# Patient Record
Sex: Female | Born: 1940 | Race: White | Hispanic: No | Smoking: Never smoker
Health system: Southern US, Community
[De-identification: ages and names within clinical notes are randomized; demographics above are authoritative.]

## PROBLEM LIST (undated history)

## (undated) DIAGNOSIS — R0600 Dyspnea, unspecified: Secondary | ICD-10-CM

## (undated) DIAGNOSIS — K219 Gastro-esophageal reflux disease without esophagitis: Secondary | ICD-10-CM

## (undated) DIAGNOSIS — M1990 Unspecified osteoarthritis, unspecified site: Secondary | ICD-10-CM

## (undated) DIAGNOSIS — Z87442 Personal history of urinary calculi: Secondary | ICD-10-CM

## (undated) DIAGNOSIS — E785 Hyperlipidemia, unspecified: Secondary | ICD-10-CM

## (undated) DIAGNOSIS — N3289 Other specified disorders of bladder: Secondary | ICD-10-CM

## (undated) DIAGNOSIS — Z889 Allergy status to unspecified drugs, medicaments and biological substances status: Secondary | ICD-10-CM

## (undated) DIAGNOSIS — R918 Other nonspecific abnormal finding of lung field: Secondary | ICD-10-CM

## (undated) DIAGNOSIS — J189 Pneumonia, unspecified organism: Secondary | ICD-10-CM

## (undated) DIAGNOSIS — J449 Chronic obstructive pulmonary disease, unspecified: Secondary | ICD-10-CM

## (undated) HISTORY — PX: ESOPHAGOGASTRODUODENOSCOPY: SHX1529

## (undated) HISTORY — PX: COLONOSCOPY: SHX174

## (undated) HISTORY — PX: TONSILLECTOMY: SUR1361

## (undated) HISTORY — PX: HERNIA REPAIR: SHX51

---

## 2005-01-18 ENCOUNTER — Ambulatory Visit: Payer: Self-pay | Admitting: Family Medicine

## 2005-02-03 ENCOUNTER — Ambulatory Visit: Payer: Self-pay | Admitting: Family Medicine

## 2005-03-15 ENCOUNTER — Ambulatory Visit: Payer: Self-pay | Admitting: Family Medicine

## 2007-01-25 ENCOUNTER — Ambulatory Visit: Payer: Self-pay | Admitting: Family Medicine

## 2007-05-11 ENCOUNTER — Ambulatory Visit: Payer: Self-pay | Admitting: Gastroenterology

## 2007-07-24 ENCOUNTER — Ambulatory Visit: Payer: Self-pay | Admitting: Gastroenterology

## 2008-01-28 ENCOUNTER — Ambulatory Visit: Payer: Self-pay | Admitting: Family Medicine

## 2009-01-29 ENCOUNTER — Ambulatory Visit: Payer: Self-pay | Admitting: Family Medicine

## 2009-06-11 ENCOUNTER — Ambulatory Visit: Payer: Self-pay | Admitting: Family Medicine

## 2009-09-07 ENCOUNTER — Ambulatory Visit: Payer: Self-pay | Admitting: Gastroenterology

## 2009-09-09 ENCOUNTER — Ambulatory Visit: Payer: Self-pay | Admitting: Family Medicine

## 2009-10-16 ENCOUNTER — Ambulatory Visit: Payer: Self-pay | Admitting: Gastroenterology

## 2009-12-10 ENCOUNTER — Ambulatory Visit: Payer: Self-pay | Admitting: Family Medicine

## 2010-02-01 ENCOUNTER — Ambulatory Visit: Payer: Self-pay | Admitting: Family Medicine

## 2010-06-15 ENCOUNTER — Ambulatory Visit: Payer: Self-pay | Admitting: Family Medicine

## 2011-03-08 ENCOUNTER — Ambulatory Visit: Payer: Self-pay | Admitting: Family Medicine

## 2011-10-26 ENCOUNTER — Observation Stay: Payer: Self-pay | Admitting: Internal Medicine

## 2011-10-26 LAB — COMPREHENSIVE METABOLIC PANEL
Albumin: 4.1 g/dL (ref 3.4–5.0)
Alkaline Phosphatase: 83 U/L (ref 50–136)
Anion Gap: 8 (ref 7–16)
BUN: 22 mg/dL — ABNORMAL HIGH (ref 7–18)
Chloride: 104 mmol/L (ref 98–107)
Creatinine: 0.79 mg/dL (ref 0.60–1.30)
EGFR (African American): >60
Glucose: 108 mg/dL — ABNORMAL HIGH (ref 65–99)
Potassium: 4 mmol/L (ref 3.5–5.1)
SGOT(AST): 20 U/L (ref 15–37)
Sodium: 140 mmol/L (ref 136–145)
Total Protein: 8.2 g/dL (ref 6.4–8.2)

## 2011-10-26 LAB — CBC
MCH: 31 pg (ref 26.0–34.0)
MCHC: 34.3 g/dL (ref 32.0–36.0)
MCV: 90 fL (ref 80–100)
Platelet: 210 10*3/uL (ref 150–440)
RBC: 4.5 10*6/uL (ref 3.80–5.20)
RDW: 12.6 % (ref 11.5–14.5)
WBC: 8 10*3/uL (ref 3.6–11.0)

## 2011-10-26 LAB — URINALYSIS, COMPLETE
Bilirubin,UR: NEGATIVE
Ketone: NEGATIVE
Leukocyte Esterase: NEGATIVE
Ph: 7 (ref 4.5–8.0)
Protein: NEGATIVE
RBC,UR: 2 /HPF (ref 0–5)
Squamous Epithelial: 3

## 2011-10-26 LAB — CK TOTAL AND CKMB (NOT AT ARMC)
CK, Total: 72 U/L (ref 21–215)
CK-MB: 1.4 ng/mL (ref 0.5–3.6)

## 2011-10-26 LAB — TROPONIN I
Troponin-I: <0.02 ng/mL
Troponin-I: <0.02 ng/mL

## 2011-10-27 LAB — BASIC METABOLIC PANEL
Anion Gap: 7 (ref 7–16)
BUN: 17 mg/dL (ref 7–18)
Calcium, Total: 8.2 mg/dL — ABNORMAL LOW (ref 8.5–10.1)
Co2: 28 mmol/L (ref 21–32)
Creatinine: 0.97 mg/dL (ref 0.60–1.30)
EGFR (African American): >60
EGFR (Non-African Amer.): 60 — ABNORMAL LOW
Glucose: 104 mg/dL — ABNORMAL HIGH (ref 65–99)
Osmolality: 287 (ref 275–301)
Potassium: 3.8 mmol/L (ref 3.5–5.1)
Sodium: 143 mmol/L (ref 136–145)

## 2011-10-27 LAB — LIPID PANEL
Triglycerides: 250 mg/dL — ABNORMAL HIGH (ref 0–200)
VLDL Cholesterol, Calc: 50 mg/dL — ABNORMAL HIGH (ref 5–40)

## 2011-10-27 LAB — CBC WITH DIFFERENTIAL/PLATELET
Basophil #: 0 10*3/uL (ref 0.0–0.1)
Basophil %: 0.5 %
Eosinophil #: 0.2 10*3/uL (ref 0.0–0.7)
HCT: 36.8 % (ref 35.0–47.0)
HGB: 12.4 g/dL (ref 12.0–16.0)
Lymphocyte #: 2.3 10*3/uL (ref 1.0–3.6)
MCHC: 33.7 g/dL (ref 32.0–36.0)
MCV: 91 fL (ref 80–100)
Monocyte #: 0.7 x10 3/mm (ref 0.2–0.9)
Monocyte %: 9 %
Neutrophil #: 4.7 10*3/uL (ref 1.4–6.5)
Platelet: 182 10*3/uL (ref 150–440)
RBC: 4.06 10*6/uL (ref 3.80–5.20)
RDW: 12.9 % (ref 11.5–14.5)

## 2011-10-27 LAB — TROPONIN I: Troponin-I: 0.02 ng/mL

## 2012-03-20 ENCOUNTER — Ambulatory Visit: Payer: Self-pay | Admitting: Family Medicine

## 2012-12-03 ENCOUNTER — Ambulatory Visit: Payer: Self-pay | Admitting: Gastroenterology

## 2013-03-28 ENCOUNTER — Ambulatory Visit: Payer: Self-pay | Admitting: Family Medicine

## 2014-04-29 ENCOUNTER — Ambulatory Visit: Payer: Self-pay | Admitting: Family Medicine

## 2014-08-17 NOTE — H&P (Signed)
PATIENT NAME:  Kim Bentley, Kim Bentley MR#:  [id number] DATE OF BIRTH:  1940/05/27  DATE OF ADMISSION:  10/26/2011  PRIMARY CARE PHYSICIAN: Dr. Lovie Macadamia   CHIEF COMPLAINT: Woke up at 3:00 a.m. feeling sick.   HISTORY OF PRESENT ILLNESS: This is a 74 year old female otherwise very healthy, woke up at 3:00 a.m. feeling clammy, sick, nauseated. She was dizzy when she got up. She vomited. She has been having a nagging headache going on. She went back to bed and got up at 7:30, still felt dizzy and sick and lightheaded. She went to the urgent care. She told them about some tightness in both sides of her chest that lasted a few minutes during this episode. She was found to have flipped T waves laterally on EKG and sent to the ER from urgent care. She has also been complaining of chest congestion, coughing up whitish phlegm. She took Tussionex last night. In the ER she had a normal white count, troponin was negative, chest x-ray negative.   PAST MEDICAL HISTORY: 1. Allergies. 2. Gastroesophageal reflux disease. 3. Colon polyps. 4. Previous CT scan of the chest back in 2012 showed stable appearing bilateral pulmonary nodular densities.   PAST SURGICAL HISTORY:  1. Hernia. 2. Tonsils.   ALLERGIES: Acyclovir.   MEDICATIONS:  1. Calcium and vitamin D daily.  2. Allegra-D as needed for allergies.  3. Protonix 40 mg daily.  4. Vitamin D 50,000 units weekly. 5. Vitamin E 1 tablet daily.   SOCIAL HISTORY: No smoking. Occasional wine. No drug use. She works in Psychologist, counselling.   FAMILY HISTORY: Mother died at 55 of diabetic complications. Father died at 65 of an accident. Siblings with diabetes.   REVIEW OF SYSTEMS: CONSTITUTIONAL: Positive for sweats. No fever. No chills. Positive for fatigue. No weight gain. No weight loss. EYES: Some blurry vision. Needs to go back to the eye doctor for glasses change. EARS, NOSE, MOUTH, AND THROAT: Decreased hearing. Positive for postnasal drip, dry mouth.  CARDIOVASCULAR: Positive for chest pressure. RESPIRATORY: Occasional shortness of breath. Positive for cough, whitish phlegm. No sputum. No hemoptysis. GASTROINTESTINAL: Positive for nausea. Positive for vomiting. No hematemesis. No abdominal pain. No bright red blood per rectum. No melena. No diarrhea. No constipation. GENITOURINARY: No burning on urination MUSCULOSKELETAL: Positive for muscle pains and cramps. INTEGUMENTARY: Positive for rash on the back. NEUROLOGIC: No fainting or blackouts. PSYCHIATRIC: No anxiety or depression. ENDOCRINE: No thyroid problems. HEMATOLOGIC/LYMPHATIC: No anemia, no easy bruising or bleeding.   PHYSICAL EXAMINATION:  VITAL SIGNS: Temperature 97.4, pulse 69, respirations 18, blood pressure 136/69, pulse oximetry 97%.   GENERAL: No respiratory distress.   EYES: Conjunctivae and lids normal. Pupils equal, round, and reactive to light. Extraocular muscles intact. No nystagmus.   EARS, NOSE, MOUTH, AND THROAT: Tympanic membrane bulging and erythematous. Nasal mucosa no erythema. Throat no erythema. No exudate seen. Lips and gums no lesions.   NECK: No JVD. No bruits. No lymphadenopathy. No thyromegaly. No thyroid nodules palpated.   RESPIRATORY: Decreased breath sounds bilaterally. Coarse breath sounds. No rhonchi, rales, or wheeze heard.   CARDIOVASCULAR: S1, S2 normal. No gallops, rubs, or murmurs heard. Carotid upstroke 2+ bilaterally. No bruits.   EXTREMITIES: Dorsalis pedis pulses 2+ bilaterally. No edema of the lower extremity.   ABDOMEN: Soft, nontender. No organomegaly/splenomegaly. Normoactive bowel sounds. No masses felt.   LYMPHATIC: No lymph nodes in the neck.   MUSCULOSKELETAL: No clubbing, edema, or cyanosis.   SKIN: On the back secondary excoriations, some erythematous areas.  NEUROLOGIC: Cranial nerves II through XII grossly intact. Deep tendon reflexes 2+ bilateral lower extremity. Power 5/5 bilateral upper and lower extremities.    PSYCHIATRIC: Patient is oriented to person, place, and time.   LABORATORY, DIAGNOSTIC, AND RADIOLOGICAL DATA: CT scan of the head negative. Chest x-ray: Mild enlargement of cardiac silhouette, otherwise negative. Troponin negative. White blood cell count 8.0, hemoglobin and hematocrit 13.9 and 40.6, platelet count 210, glucose 108, BUN 22, creatinine 0.79, sodium 140, potassium 4.0, chloride 104, CO2 28, calcium 9.1. Liver function tests normal. EKG showed normal sinus rhythm, 67 beats per minute, flipped T waves laterally.   ASSESSMENT AND PLAN:  1. Chest pain and abnormal EKG. Will admit as an observation. Get serial cardiac enzymes to rule out myocardial infarction. Will continue aspirin, one dose already given in the Emergency Room. Unfortunately tomorrow is 07/04 and stress test is not available. If cardiac enzymes are negative patient may be able to be discharged home and follow up as outpatient stress test. What I believe is the cause of her chest pain is an upper respiratory tract infection and bronchitis.  2. Otitis media and bronchitis. Will give Augmentin 500 mg twice a day.  3. Nausea, vomiting and dizziness, could be secondary to the otitis media. Will treat that, p.r.n. Zofran. Will give IV fluid hydration.  4. Gastroesophageal reflux disease. Continue Protonix.  5. Allergies. Continue Allegra.  6. Rash on the back. Will give triamcinolone cream.    TIME SPENT ON OBSERVATION ADMISSION: 55 minutes.   ____________________________ Tana Conch. Leslye Peer, MD rjw:cms D: 10/26/2011 13:44:41 ET T: 10/26/2011 13:52:53 ET JOB#: [id number]  cc: Tana Conch. Leslye Peer, MD, <Dictator> Youlanda Roys. Lovie Macadamia, MD Marisue Brooklyn MD ELECTRONICALLY SIGNED 10/27/2011 12:24

## 2014-08-17 NOTE — Discharge Summary (Signed)
PATIENT NAME:  Kim Bentley, Kim Bentley MR#:  [id number] DATE OF BIRTH:  [birth year]  DATE OF ADMISSION:  10/26/2011 DATE OF DISCHARGE:  10/27/2011  DISCHARGE DIAGNOSES:  1. Possible acute bronchitis and/or otitis media, on antibiotics, improving.  2. Chest pain, likely due to constant coughing, noncardiac.  3. Hypercholesterolemia/hypertriglyceridemia, started on fish oil and will require outpatient adjustment.  4. Rash in the back, improving on steroid cream.   SECONDARY DIAGNOSES:  1. Gastroesophageal reflux disease.  2. History of colonic polyp.   CONSULTATIONS: None.   LABORATORY DATA/RADIOLOGY:  1. CT scan of the head without contrast in the ED was negative for any acute intracranial process.  2. Chest x-ray showed no acute cardiopulmonary disease on admission. There was mild enlargement of cardiac silhouette without pulmonary vascular congestion or interstitial edema.  3. Major laboratory panel: Urinalysis on admission was negative.   HISTORY AND SHORT HOSPITAL COURSE: The patient is a 74 year old female with above-mentioned medical problems who was admitted for chest pain, was ruled out with 3 negative sets of cardiac enzymes. Please see Dr. Marshia Ly dictated History and Physical for further details. The patient was feeling much better on antibiotics. She was still having some coughing but had much improved and was agreeable to the discharge plan. On the date of discharge her vital signs were as follows: Temperature 98.5, heart rate 94 per minute, respirations 18 per minute, blood pressure 133/74 mmHg. She was saturating 96% on room air.   PERTINENT PHYSICAL EXAMINATION ON THE DAY OF DISCHARGE:   CARDIOVASCULAR: S1 and S2 normal. No murmurs, rubs, gallop.   LUNGS: Clear to auscultation bilaterally. No wheezing, rales, rhonchi, or crepitation.   ABDOMEN: Soft, benign.   NEUROLOGIC: Nonfocal examination.   All other physical examination remained at baseline. She did have a mild rash in  the back, which was improving with the topical steroid application.   DISCHARGE MEDICATIONS:  1. Protonix 40 mg p.o. daily.  2. Vitamin D3 at 5,000 international units once daily.  3. Calcium with vitamin D 1 tablet p.o. daily.  4. Vitamin E 1 tablet p.o. daily.  5. Fexofenadine 1 tablet p.o. daily as needed.  6. Triamcinolone 0.05% topical ointment apply topically twice a day.  7. Augmentin 875 mg/125 mg 1 tablet p.o. b.i.d. for 5 days.  8. Fish oil 500 mg 2 capsules p.o. b.i.d.  9. Robitussin 5 mL p.o. 4 times a day.   DISCHARGE DIET: Low cholesterol.   DISCHARGE ACTIVITY: As tolerated.   DISCHARGE INSTRUCTIONS AND FOLLOWUP: Patient was instructed to follow up with her primary care physician, Dr. Juluis Pitch, in 1 to 2 weeks.   TOTAL TIME DISCHARGING THIS PATIENT: 45 minutes.     ____________________________ Lucina Mellow. Manuella Ghazi, MD vss:vtd D: 10/27/2011 11:57:20 ET T: 10/28/2011 11:59:06 ET JOB#: [id number] cc: Cherry Wittwer S. Manuella Ghazi, MD, <Dictator> Youlanda Roys. Lovie Macadamia, MD Remer Macho MD ELECTRONICALLY SIGNED 10/28/2011 19:45

## 2016-06-03 ENCOUNTER — Other Ambulatory Visit: Payer: Self-pay | Admitting: Family Medicine

## 2016-06-03 DIAGNOSIS — Z1231 Encounter for screening mammogram for malignant neoplasm of breast: Secondary | ICD-10-CM

## 2016-08-02 ENCOUNTER — Ambulatory Visit
Admission: RE | Admit: 2016-08-02 | Discharge: 2016-08-02 | Disposition: A | Discharge status: Home / Self Care | Payer: Medicare HMO | Source: Ambulatory Visit | Attending: Family Medicine | Admitting: Family Medicine

## 2016-08-02 DIAGNOSIS — Z1231 Encounter for screening mammogram for malignant neoplasm of breast: Secondary | ICD-10-CM | POA: Diagnosis present

## 2017-08-28 ENCOUNTER — Other Ambulatory Visit: Payer: Self-pay | Admitting: Family Medicine

## 2017-08-28 DIAGNOSIS — Z1231 Encounter for screening mammogram for malignant neoplasm of breast: Secondary | ICD-10-CM

## 2017-09-14 ENCOUNTER — Encounter: Payer: Self-pay | Admitting: Radiology

## 2017-09-14 ENCOUNTER — Ambulatory Visit
Admission: RE | Admit: 2017-09-14 | Discharge: 2017-09-14 | Disposition: A | Discharge status: Home / Self Care | Payer: Medicare HMO | Source: Ambulatory Visit | Attending: Family Medicine | Admitting: Family Medicine

## 2017-09-14 DIAGNOSIS — Z1231 Encounter for screening mammogram for malignant neoplasm of breast: Secondary | ICD-10-CM | POA: Insufficient documentation

## 2018-03-21 ENCOUNTER — Encounter: Payer: Self-pay | Admitting: *Deleted

## 2018-03-26 ENCOUNTER — Ambulatory Visit
Admission: RE | Admit: 2018-03-26 | Discharge: 2018-03-26 | Disposition: A | Discharge status: Home / Self Care | Payer: Medicare HMO | Source: Ambulatory Visit | Attending: Gastroenterology | Admitting: Gastroenterology

## 2018-03-26 ENCOUNTER — Encounter
Admission: RE | Disposition: A | Discharge status: Home / Self Care | Payer: Self-pay | Source: Ambulatory Visit | Attending: Gastroenterology

## 2018-03-26 ENCOUNTER — Encounter: Payer: Self-pay | Admitting: *Deleted

## 2018-03-26 ENCOUNTER — Other Ambulatory Visit: Payer: Self-pay

## 2018-03-26 ENCOUNTER — Ambulatory Visit: Payer: Medicare HMO | Admitting: Anesthesiology

## 2018-03-26 DIAGNOSIS — Z1211 Encounter for screening for malignant neoplasm of colon: Secondary | ICD-10-CM | POA: Diagnosis present

## 2018-03-26 DIAGNOSIS — K5730 Diverticulosis of large intestine without perforation or abscess without bleeding: Secondary | ICD-10-CM | POA: Insufficient documentation

## 2018-03-26 DIAGNOSIS — N3289 Other specified disorders of bladder: Secondary | ICD-10-CM | POA: Diagnosis not present

## 2018-03-26 DIAGNOSIS — Z79899 Other long term (current) drug therapy: Secondary | ICD-10-CM | POA: Diagnosis not present

## 2018-03-26 DIAGNOSIS — D123 Benign neoplasm of transverse colon: Secondary | ICD-10-CM | POA: Insufficient documentation

## 2018-03-26 DIAGNOSIS — M1990 Unspecified osteoarthritis, unspecified site: Secondary | ICD-10-CM | POA: Diagnosis not present

## 2018-03-26 DIAGNOSIS — Z86010 Personal history of colonic polyps: Secondary | ICD-10-CM | POA: Insufficient documentation

## 2018-03-26 DIAGNOSIS — K219 Gastro-esophageal reflux disease without esophagitis: Secondary | ICD-10-CM | POA: Diagnosis not present

## 2018-03-26 DIAGNOSIS — D122 Benign neoplasm of ascending colon: Secondary | ICD-10-CM | POA: Insufficient documentation

## 2018-03-26 HISTORY — DX: Unspecified osteoarthritis, unspecified site: M19.90

## 2018-03-26 HISTORY — DX: Gastro-esophageal reflux disease without esophagitis: K21.9

## 2018-03-26 HISTORY — PX: COLONOSCOPY WITH PROPOFOL: SHX5780

## 2018-03-26 HISTORY — DX: Other specified disorders of bladder: N32.89

## 2018-03-26 HISTORY — DX: Allergy status to unspecified drugs, medicaments and biological substances: Z88.9

## 2018-03-26 SURGERY — COLONOSCOPY WITH PROPOFOL
Anesthesia: General

## 2018-03-26 MED ORDER — FENTANYL CITRATE (PF) 100 MCG/2ML IJ SOLN
INTRAMUSCULAR | Status: AC
  Filled 2018-03-26: qty 2

## 2018-03-26 MED ORDER — PROPOFOL 500 MG/50ML IV EMUL
INTRAVENOUS | Status: AC
Start: 2018-03-26 — End: ?
  Filled 2018-03-26: qty 50

## 2018-03-26 MED ORDER — SODIUM CHLORIDE 0.9 % IV SOLN
INTRAVENOUS | Status: DC
  Administered 2018-03-26: 10:00:00 via INTRAVENOUS

## 2018-03-26 MED ORDER — MIDAZOLAM HCL 2 MG/2ML IJ SOLN
INTRAMUSCULAR | Status: DC | PRN
  Administered 2018-03-26: 1 mg via INTRAVENOUS

## 2018-03-26 MED ORDER — EPHEDRINE SULFATE 50 MG/ML IJ SOLN
INTRAMUSCULAR | Status: DC | PRN
  Administered 2018-03-26 (×2): 5 mg via INTRAVENOUS
  Administered 2018-03-26: 10 mg via INTRAVENOUS

## 2018-03-26 MED ORDER — FENTANYL CITRATE (PF) 100 MCG/2ML IJ SOLN
INTRAMUSCULAR | Status: DC | PRN
  Administered 2018-03-26 (×2): 25 ug via INTRAVENOUS
  Administered 2018-03-26: 50 ug via INTRAVENOUS

## 2018-03-26 MED ORDER — PROPOFOL 500 MG/50ML IV EMUL
INTRAVENOUS | Status: DC | PRN
  Administered 2018-03-26: 120 ug/kg/min via INTRAVENOUS

## 2018-03-26 MED ORDER — EPHEDRINE SULFATE 50 MG/ML IJ SOLN
INTRAMUSCULAR | Status: AC
  Filled 2018-03-26: qty 1

## 2018-03-26 MED ORDER — MIDAZOLAM HCL 2 MG/2ML IJ SOLN
INTRAMUSCULAR | Status: AC
  Filled 2018-03-26: qty 2

## 2018-03-26 NOTE — Anesthesia Procedure Notes (Addendum)
Performed by: Cook-Martin, Viana Sleep Pre-anesthesia Checklist: Patient identified, Emergency Drugs available, Suction available, Patient being monitored and Timeout performed Patient Re-evaluated:Patient Re-evaluated prior to induction Oxygen Delivery Method: Nasal cannula Preoxygenation: Pre-oxygenation with 100% oxygen Induction Type: IV induction Ventilation: Oral airway inserted - appropriate to patient size Placement Confirmation: positive ETCO2 and CO2 detector       

## 2018-03-26 NOTE — Anesthesia Post-op Follow-up Note (Signed)
Anesthesia QCDR form completed.        

## 2018-03-26 NOTE — Transfer of Care (Signed)
Immediate Anesthesia Transfer of Care Note  Patient: Kim Bentley  Procedure(s) Performed: COLONOSCOPY WITH PROPOFOL (N/A )  Patient Location: PACU  Anesthesia Type:MAC  Level of Consciousness: awake and sedated  Airway & Oxygen Therapy: Patient Spontanous Breathing and Patient connected to face mask oxygen  Post-op Assessment: Report given to RN and Post -op Vital signs reviewed and stable  Post vital signs: Reviewed and stable  Last Vitals:  Vitals Value Taken Time  BP    Temp    Pulse    Resp    SpO2      Last Pain:  Vitals:   03/26/18 0935  TempSrc: Tympanic  PainSc: 0-No pain         Complications: No apparent anesthesia complications

## 2018-03-26 NOTE — Op Note (Signed)
[facility] Gastroenterology Patient Name: Kim Bentley Procedure Date: 03/26/2018 9:59 AM MRN: [id number] Account #: [id number] Date of Birth: 1940/08/05 Admit Type: Outpatient Age: 77 Room: [facility] ENDO ROOM 3 Gender: Female Note Status: Finalized Procedure:            Colonoscopy Indications:          Personal history of colonic polyps Providers:            Lollie Sails, MD Referring MD:         Youlanda Roys. Lovie Macadamia, MD (Referring MD) Medicines:            Monitored Anesthesia Care Complications:        No immediate complications. Procedure:            Pre-Anesthesia Assessment:                       - ASA Grade Assessment: II - A patient with mild                        systemic disease.                       After obtaining informed consent, the colonoscope was                        passed under direct vision. Throughout the procedure,                        the patient's blood pressure, pulse, and oxygen                        saturations were monitored continuously. The                        Colonoscope was introduced through the anus and                        advanced to the the cecum, identified by appendiceal                        orifice and ileocecal valve. The colonoscopy was                        performed without difficulty. The patient tolerated the                        procedure well. The quality of the bowel preparation                        was good. Findings:      Four sessile polyps were found in the distal transverse colon. The       polyps were 2 to 3 mm in size. These polyps were removed with a cold       biopsy forceps. Resection and retrieval were complete.      A 3 mm polyp was found in the proximal transverse colon. The polyp was       sessile. The polyp was removed with a cold biopsy forceps. Resection and       retrieval were complete.      A 3 mm polyp was found in the proximal  ascending colon. The polyp was       sessile.  The polyp was removed with a cold biopsy forceps. Resection and       retrieval were complete.      A few small-mouthed diverticula were found in the sigmoid colon.      A tattoo was seen in the proximal descending colon. A post-polypectomy       scar was found at the tattoo site. There was no evidence of residual       polyp tissue.      The retroflexed view of the distal rectum and anal verge was normal and       showed no anal or rectal abnormalities.      The digital rectal exam was normal. Impression:           - Four 2 to 3 mm polyps in the distal transverse colon,                        removed with a cold biopsy forceps. Resected and                        retrieved.                       - One 3 mm polyp in the proximal transverse colon,                        removed with a cold biopsy forceps. Resected and                        retrieved.                       - One 3 mm polyp in the proximal ascending colon,                        removed with a cold biopsy forceps. Resected and                        retrieved.                       - Diverticulosis in the sigmoid colon.                       - A tattoo was seen in the proximal descending colon. A                        post-polypectomy scar was found at the tattoo site.                        There was no evidence of residual polyp tissue.                       - The distal rectum and anal verge are normal on                        retroflexion view. Recommendation:       - Discharge patient to home.                       - Telephone  GI clinic for pathology results in 1 week. Procedure Code(s):    --- Professional ---                       [phone], Colonoscopy, flexible; with biopsy, single or                        multiple Diagnosis Code(s):    --- Professional ---                       D12.3, Benign neoplasm of transverse colon (hepatic                        flexure or splenic flexure)                       D12.2,  Benign neoplasm of ascending colon                       Z86.010, Personal history of colonic polyps                       K57.30, Diverticulosis of large intestine without                        perforation or abscess without bleeding CPT copyright 2018 [employer]. All rights reserved. The codes documented in this report are preliminary and upon coder review may  be revised to meet current compliance requirements. Lollie Sails, MD 03/26/2018 10:48:19 AM This report has been signed electronically. Number of Addenda: 0 Note Initiated On: 03/26/2018 9:59 AM Scope Withdrawal Time: 0 hours 17 minutes 24 seconds  Total Procedure Duration: 0 hours 32 minutes 59 seconds       [facility]

## 2018-03-26 NOTE — Anesthesia Preprocedure Evaluation (Addendum)
Anesthesia Evaluation  Patient identified by MRN, date of birth, ID band Patient awake    Reviewed: Allergy & Precautions, H&P , NPO status , Patient's Chart, lab work & pertinent test results  Airway Mallampati: III       Dental  (+) Teeth Intact   Pulmonary neg pulmonary ROS, neg COPD, neg recent URI,           Cardiovascular (-) angina(-) Past MI and (-) CABG negative cardio ROS  (-) dysrhythmias      Neuro/Psych negative neurological ROS  negative psych ROS   GI/Hepatic Neg liver ROS, GERD  ,  Endo/Other  negative endocrine ROS  Renal/GU negative Renal ROS  negative genitourinary   Musculoskeletal  (+) Arthritis ,   Abdominal   Peds  Hematology negative hematology ROS (+)   Anesthesia Other Findings Past Medical History: No date: Allergic genetic state No date: Arthritis No date: Bladder spasms No date: GERD (gastroesophageal reflux disease)  Past Surgical History: No date: COLONOSCOPY No date: ESOPHAGOGASTRODUODENOSCOPY No date: HERNIA REPAIR No date: TONSILLECTOMY  BMI    Body Mass Index:  28.34 kg/m      Reproductive/Obstetrics negative OB ROS                            Anesthesia Physical Anesthesia Plan  ASA: II  Anesthesia Plan: General   Post-op Pain Management:    Induction:   PONV Risk Score and Plan: Propofol infusion and TIVA  Airway Management Planned: Natural Airway and Nasal Cannula  Additional Equipment:   Intra-op Plan:   Post-operative Plan:   Informed Consent: I have reviewed the patients History and Physical, chart, labs and discussed the procedure including the risks, benefits and alternatives for the proposed anesthesia with the patient or authorized representative who has indicated his/her understanding and acceptance.   Dental Advisory Given  Plan Discussed with: Anesthesiologist  Anesthesia Plan Comments:         Anesthesia  Quick Evaluation

## 2018-03-26 NOTE — H&P (Signed)
Outpatient short stay form Pre-procedure 03/26/2018 10:00 AM Lollie Sails MD  Primary Physician: Juluis Pitch, MD  Reason for visit: Colonoscopy  History of present illness: Patient is a 77 year old female presenting today for a colonoscopy.  She has personal history of adenomatous colon polyps.  She tolerated her prep well.  She takes no aspirin or blood thinning agent.    Current Facility-Administered Medications:  .  0.9 %  sodium chloride infusion, , Intravenous, Continuous, Lollie Sails, MD, Last Rate: 20 mL/hr at 03/26/18 9179  Medications Prior to Admission  Medication Sig Dispense Refill Last Dose  . ALPRAZolam (XANAX) 0.5 MG tablet Take 0.5 mg by mouth every 8 (eight) hours as needed for anxiety.     . calcium-vitamin D (OSCAL WITH D) 500-200 MG-UNIT tablet Take 1 tablet by mouth 2 (two) times daily.   Past Week at Unknown time  . OMEGA-3 FATTY ACIDS PO Take by mouth.   Past Week at Unknown time  . pantoprazole (PROTONIX) 40 MG tablet Take 40 mg by mouth daily.   Past Week at Unknown time  . vitamin C (ASCORBIC ACID) 500 MG tablet Take 500 mg by mouth daily.   Past Week at Unknown time     Not on File   Past Medical History:  Diagnosis Date  . Allergic genetic state   . Arthritis   . Bladder spasms   . GERD (gastroesophageal reflux disease)     Review of systems:      Physical Exam    Heart and lungs: Regular rate and rhythm without rub or gallop, lungs are bilaterally clear.    HEENT: Normocephalic atraumatic eyes are anicteric    Other:    Pertinant exam for procedure: Soft nontender nondistended bowel sounds positive normoactive.    Planned proceedures: Colonoscopy and indicated procedures. I have discussed the risks benefits and complications of procedures to include not limited to bleeding, infection, perforation and the risk of sedation and the patient wishes to proceed.    Lollie Sails, MD Gastroenterology 03/26/2018  10:00  AM

## 2018-03-27 LAB — SURGICAL PATHOLOGY

## 2018-03-27 NOTE — Anesthesia Postprocedure Evaluation (Signed)
Anesthesia Post Note  Patient: Kim Bentley  Procedure(s) Performed: COLONOSCOPY WITH PROPOFOL (N/A )  Patient location during evaluation: PACU Anesthesia Type: General Level of consciousness: awake and alert Pain management: pain level controlled Vital Signs Assessment: post-procedure vital signs reviewed and stable Respiratory status: spontaneous breathing, nonlabored ventilation, respiratory function stable and patient connected to nasal cannula oxygen Cardiovascular status: blood pressure returned to baseline and stable Postop Assessment: no apparent nausea or vomiting Anesthetic complications: no     Last Vitals:  Vitals:   03/26/18 1110 03/26/18 1117  BP: (!) 128/59 115/70  Pulse: 77   Resp: 18   Temp:    SpO2:      Last Pain:  Vitals:   03/26/18 1110  TempSrc:   PainSc: 0-No pain                 Durenda Hurt

## 2018-03-28 ENCOUNTER — Encounter: Payer: Self-pay | Admitting: Gastroenterology

## 2018-09-03 ENCOUNTER — Other Ambulatory Visit: Payer: Self-pay | Admitting: Family Medicine

## 2018-09-03 DIAGNOSIS — Z1231 Encounter for screening mammogram for malignant neoplasm of breast: Secondary | ICD-10-CM

## 2018-09-20 ENCOUNTER — Ambulatory Visit
Admission: RE | Admit: 2018-09-20 | Discharge: 2018-09-20 | Disposition: A | Discharge status: Home / Self Care | Payer: Medicare HMO | Source: Ambulatory Visit | Attending: Family Medicine | Admitting: Family Medicine

## 2018-09-20 ENCOUNTER — Other Ambulatory Visit: Payer: Self-pay

## 2018-09-20 DIAGNOSIS — Z1231 Encounter for screening mammogram for malignant neoplasm of breast: Secondary | ICD-10-CM | POA: Insufficient documentation

## 2019-04-26 DIAGNOSIS — U071 COVID-19: Secondary | ICD-10-CM

## 2019-04-26 HISTORY — DX: COVID-19: U07.1

## 2019-06-20 ENCOUNTER — Other Ambulatory Visit: Payer: Self-pay | Admitting: Internal Medicine

## 2019-06-20 DIAGNOSIS — M542 Cervicalgia: Secondary | ICD-10-CM

## 2019-06-28 ENCOUNTER — Ambulatory Visit: Admission: RE | Admit: 2019-06-28 | Payer: Medicare HMO | Source: Ambulatory Visit

## 2019-07-12 ENCOUNTER — Other Ambulatory Visit: Payer: Self-pay

## 2019-07-12 ENCOUNTER — Ambulatory Visit
Admission: RE | Admit: 2019-07-12 | Discharge: 2019-07-12 | Disposition: A | Discharge status: Home / Self Care | Payer: Medicare HMO | Source: Ambulatory Visit | Attending: Internal Medicine | Admitting: Internal Medicine

## 2019-07-12 DIAGNOSIS — M542 Cervicalgia: Secondary | ICD-10-CM | POA: Insufficient documentation

## 2019-08-16 ENCOUNTER — Other Ambulatory Visit: Payer: Self-pay | Admitting: Family Medicine

## 2019-08-16 DIAGNOSIS — Z1231 Encounter for screening mammogram for malignant neoplasm of breast: Secondary | ICD-10-CM

## 2019-09-25 ENCOUNTER — Ambulatory Visit
Admission: RE | Admit: 2019-09-25 | Discharge: 2019-09-25 | Disposition: A | Discharge status: Home / Self Care | Payer: Medicare HMO | Source: Ambulatory Visit | Attending: Family Medicine | Admitting: Family Medicine

## 2019-09-25 DIAGNOSIS — Z1231 Encounter for screening mammogram for malignant neoplasm of breast: Secondary | ICD-10-CM | POA: Insufficient documentation

## 2020-09-07 ENCOUNTER — Other Ambulatory Visit: Payer: Self-pay | Admitting: Family Medicine

## 2020-09-07 DIAGNOSIS — Z1231 Encounter for screening mammogram for malignant neoplasm of breast: Secondary | ICD-10-CM

## 2020-09-25 ENCOUNTER — Ambulatory Visit
Admission: RE | Admit: 2020-09-25 | Discharge: 2020-09-25 | Disposition: A | Discharge status: Home / Self Care | Payer: Medicare HMO | Source: Ambulatory Visit | Attending: Family Medicine | Admitting: Family Medicine

## 2020-09-25 ENCOUNTER — Other Ambulatory Visit: Payer: Self-pay

## 2020-09-25 DIAGNOSIS — Z1231 Encounter for screening mammogram for malignant neoplasm of breast: Secondary | ICD-10-CM | POA: Insufficient documentation

## 2020-10-01 ENCOUNTER — Other Ambulatory Visit: Payer: Self-pay | Admitting: Family Medicine

## 2020-10-01 DIAGNOSIS — R928 Other abnormal and inconclusive findings on diagnostic imaging of breast: Secondary | ICD-10-CM

## 2020-10-01 DIAGNOSIS — N6489 Other specified disorders of breast: Secondary | ICD-10-CM

## 2020-10-07 ENCOUNTER — Ambulatory Visit
Admission: RE | Admit: 2020-10-07 | Discharge: 2020-10-07 | Disposition: A | Discharge status: Home / Self Care | Payer: Medicare HMO | Source: Ambulatory Visit | Attending: Family Medicine | Admitting: Family Medicine

## 2020-10-07 ENCOUNTER — Ambulatory Visit: Payer: Medicare HMO

## 2020-10-07 ENCOUNTER — Other Ambulatory Visit: Payer: Self-pay

## 2020-10-07 DIAGNOSIS — R928 Other abnormal and inconclusive findings on diagnostic imaging of breast: Secondary | ICD-10-CM

## 2020-10-07 DIAGNOSIS — N6489 Other specified disorders of breast: Secondary | ICD-10-CM

## 2021-02-03 ENCOUNTER — Other Ambulatory Visit: Payer: Self-pay

## 2021-02-03 ENCOUNTER — Ambulatory Visit
Admission: EM | Admit: 2021-02-03 | Discharge: 2021-02-03 | Disposition: A | Discharge status: Home / Self Care | Payer: Medicare HMO

## 2021-02-03 ENCOUNTER — Ambulatory Visit
Admission: RE | Admit: 2021-02-03 | Discharge: 2021-02-03 | Disposition: A | Discharge status: Home / Self Care | Payer: Medicare HMO | Source: Ambulatory Visit | Attending: Emergency Medicine | Admitting: Emergency Medicine

## 2021-02-03 DIAGNOSIS — M7989 Other specified soft tissue disorders: Secondary | ICD-10-CM | POA: Insufficient documentation

## 2021-02-03 DIAGNOSIS — M79662 Pain in left lower leg: Secondary | ICD-10-CM

## 2021-02-03 DIAGNOSIS — R051 Acute cough: Secondary | ICD-10-CM

## 2021-02-03 NOTE — ED Triage Notes (Addendum)
Patient presents to Urgent Care with multiple complaints of bilateral leg swelling since Monday. She believes it is related from standing a long time. Increased discomfort with walking. She also complains of a dry cough since 2 weeks. She has hx of allergies.    Denies fever or SOB.

## 2021-02-03 NOTE — ED Provider Notes (Signed)
Kim Bentley    CSN: [id number] Arrival date & time: 02/03/21  1427      History   Chief Complaint Chief Complaint  Patient presents with   Leg Swelling   Nasal Congestion    HPI Kim Bentley is a 80 y.o. female.  Patient presents with left lower leg pain and swelling x2 days.  No falls or injury.  She also reports pain in the bottom of her feet due to standing for long periods of time since her husband passed away a few days ago.  Patient also reports nonproductive cough x2 weeks.  She denies fever, chills, shortness of breath, numbness, weakness, paresthesias, or other symptoms.  Treatment attempted with ice packs and elevation of her left leg.  Her medical history includes rheumatoid arthritis, seasonal allergies, arthritis, GERD, anxiety.  The history is provided by the patient and medical records.   Past Medical History:  Diagnosis Date   Allergic genetic state    Arthritis    Bladder spasms    GERD (gastroesophageal reflux disease)     There are no problems to display for this patient.   Past Surgical History:  Procedure Laterality Date   COLONOSCOPY     COLONOSCOPY WITH PROPOFOL N/A 03/26/2018   Procedure: COLONOSCOPY WITH PROPOFOL;  Surgeon: Lollie Sails, MD;  Location[facility] ENDOSCOPY;  Service: Endoscopy;  Laterality: N/A;   ESOPHAGOGASTRODUODENOSCOPY     HERNIA REPAIR     TONSILLECTOMY      OB History   No obstetric history on file.      Home Medications    Prior to Admission medications   Medication Sig Start Date End Date Taking? Authorizing Provider  cyclobenzaprine (FLEXERIL) 5 MG tablet Take one tab twice a day, ( may cause drowsiness) for 15 days 07/19/19  Yes [provider]  folic acid (FOLVITE) 1 MG tablet Take by mouth. 10/15/20 10/15/21 Yes [provider]  hydroxychloroquine (PLAQUENIL) 200 MG tablet Take 1 tablet by mouth 2 (two) times daily. 10/15/20  Yes [provider]  methotrexate (RHEUMATREX)  2.5 MG tablet Take by mouth. 10/15/20  Yes [provider]  ALPRAZolam (XANAX) 0.5 MG tablet Take 0.5 mg by mouth every 8 (eight) hours as needed for anxiety.    [provider]  aspirin 81 MG EC tablet Take by mouth.    [provider]  calcium-vitamin D (OSCAL WITH D) 500-200 MG-UNIT tablet Take 1 tablet by mouth 2 (two) times daily.    [provider]  Cyanocobalamin 1000 MCG TBCR Take by mouth.    [provider]  meloxicam (MOBIC) 7.5 MG tablet Take by mouth. 12/06/20   [provider]  OMEGA-3 FATTY ACIDS PO Take by mouth.    [provider]  pantoprazole (PROTONIX) 40 MG tablet Take 40 mg by mouth daily.    [provider]  vitamin C (ASCORBIC ACID) 500 MG tablet Take 500 mg by mouth daily.    [provider]    Family History Family History  Problem Relation Age of Onset   Breast cancer Sister 14    Social History Social History   Tobacco Use   Smoking status: Never   Smokeless tobacco: Never  Vaping Use   Vaping Use: Never used  Substance Use Topics   Alcohol use: Not Currently   Drug use: Not Currently     Allergies   Patient has no known allergies.   Review of Systems Review of Systems  Constitutional:  Negative for chills and fever.  Respiratory:  Positive for cough. Negative for shortness of breath.   Cardiovascular:  Negative for chest pain and palpitations.  Musculoskeletal:  Positive for arthralgias and joint swelling. Negative for gait problem.  Skin:  Negative for color change and rash.  Neurological:  Negative for weakness and numbness.  All other systems reviewed and are negative.   Physical Exam Triage Vital Signs ED Triage Vitals  Enc Vitals Group     BP 02/03/21 1445 (!) 147/77     Pulse Rate 02/03/21 1445 86     Resp 02/03/21 1445 18     Temp 02/03/21 1445 98.8 F (37.1 C)     Temp Source 02/03/21 1445 Oral     SpO2 02/03/21 1445 97 %     Weight --       Height --      Head Circumference --      Peak Flow --      Pain Score 02/03/21 1440 0     Pain Loc --      Pain Edu? --      Excl. in [place]? --    No data found.  Updated Vital Signs BP (!) 147/77 (BP Location: Left Arm)   Pulse 86   Temp 98.8 F (37.1 C) (Oral)   Resp 18   SpO2 97%   Visual Acuity Right Eye Distance:   Left Eye Distance:   Bilateral Distance:    Right Eye Near:   Left Eye Near:    Bilateral Near:     Physical Exam Vitals and nursing note reviewed.  Constitutional:      General: She is not in acute distress.    Appearance: She is well-developed. She is not ill-appearing.  HENT:     Head: Normocephalic and atraumatic.     Mouth/Throat:     Mouth: Mucous membranes are moist.  Eyes:     Conjunctiva/sclera: Conjunctivae normal.  Cardiovascular:     Rate and Rhythm: Normal rate and regular rhythm.     Heart sounds: Normal heart sounds.  Pulmonary:     Effort: Pulmonary effort is normal. No respiratory distress.     Breath sounds: Normal breath sounds.  Abdominal:     Palpations: Abdomen is soft.     Tenderness: There is no abdominal tenderness.  Musculoskeletal:        General: Swelling and tenderness present. No deformity. Normal range of motion.     Cervical back: Neck supple.     Comments: Left lower leg edematous with mild tenderness to palpation of ankle.  No erythema.  No calf tenderness.  Skin:    General: Skin is warm and dry.     Capillary Refill: Capillary refill takes less than 2 seconds.     Findings: No bruising, erythema, lesion or rash.  Neurological:     General: No focal deficit present.     Mental Status: She is alert and oriented to person, place, and time.     Sensory: No sensory deficit.     Motor: No weakness.     Gait: Gait normal.  Psychiatric:        Mood and Affect: Mood normal.        Behavior: Behavior normal.     UC Treatments / Results  Labs (all labs ordered are listed, but only abnormal results are  displayed) Labs Reviewed - No data to display  EKG   Radiology US Venous Img Lower  Unilateral Left (DVT)  Result Date: 02/03/2021 CLINICAL DATA:  LEFT lower extremity swelling.  Pain. EXAM: LEFT LOWER EXTREMITY VENOUS DOPPLER ULTRASOUND TECHNIQUE: Gray-scale sonography with compression, as well as color and duplex ultrasound, were performed to evaluate the deep venous system(s) from the level of the common femoral vein through the popliteal and proximal calf veins. COMPARISON:  None. FINDINGS: VENOUS Normal compressibility of the common femoral, superficial femoral, and popliteal veins, as well as the visualized calf veins. Visualized portions of profunda femoral vein and great saphenous vein unremarkable. No filling defects to suggest DVT on grayscale or color Doppler imaging. Doppler waveforms show normal direction of venous flow, normal respiratory plasticity and response to augmentation. Limited views of the contralateral common femoral vein are unremarkable. OTHER No evidence of superficial thrombophlebitis or abnormal fluid collection. Limitations: none IMPRESSION: No femoropopliteal DVT within LEFT lower extremity. Michaelle Birks, MD [facility] Radiology Electronically Signed   By: Michaelle Birks M.D.   On: 02/03/2021 16:09    Procedures Procedures (including critical care time)  Medications Ordered in UC Medications - No data to display  Initial Impression / Assessment and Plan / UC Course  I have reviewed the triage vital signs and the nursing notes.  Pertinent labs & imaging results that were available during my care of the patient were reviewed by me and considered in my medical decision making (see chart for details).  Pain and swelling of left lower leg, acute cough.  No shortness of breath.  Ultrasound of LLE for DVT scheduled.  Discussed that I will call her with the ultrasound results this afternoon.  Discussed symptomatic treatment  for her leg swelling including elevation.  Instructed patient to follow-up with her PCP tomorrow.   Ultrasound of left lower extremity negative for DVT.  Attempted to call patient to discuss results of the ultrasound; no answer; left message for patient to call back.     Final Clinical Impressions(s) / UC Diagnoses   Final diagnoses:  Pain and swelling of left lower leg  Acute cough     Discharge Instructions      Go to the radiology department at [facility] for the ultrasound of your left leg.  Your appointment is now.         ED Prescriptions   None    PDMP not reviewed this encounter.   Sharion Balloon, NP 02/03/21 1825

## 2021-02-03 NOTE — Discharge Instructions (Addendum)
Go to the radiology department at [facility] for the ultrasound of your left leg.  Your appointment is now.

## 2021-02-04 ENCOUNTER — Telehealth (HOSPITAL_COMMUNITY): Payer: Self-pay | Admitting: Emergency Medicine

## 2021-02-04 NOTE — Telephone Encounter (Signed)
Patient called in to nurses line to return call from last night from provider.  Reviewed resultd and provider note, updated patient, no questions at this time

## 2021-04-10 ENCOUNTER — Ambulatory Visit
Admission: EM | Admit: 2021-04-10 | Discharge: 2021-04-10 | Disposition: A | Discharge status: Home / Self Care | Payer: Medicare HMO | Attending: Emergency Medicine | Admitting: Emergency Medicine

## 2021-04-10 ENCOUNTER — Other Ambulatory Visit: Payer: Self-pay

## 2021-04-10 ENCOUNTER — Encounter: Payer: Self-pay | Admitting: Emergency Medicine

## 2021-04-10 DIAGNOSIS — J0100 Acute maxillary sinusitis, unspecified: Secondary | ICD-10-CM

## 2021-04-10 MED ORDER — PREDNISONE 10 MG (21) PO TBPK: ORAL_TABLET | Freq: Every day | ORAL | 0 refills | Status: DC

## 2021-04-10 MED ORDER — AMOXICILLIN 875 MG PO TABS: 875.0000 mg | ORAL_TABLET | Freq: Two times a day (BID) | ORAL | 0 refills | Status: AC

## 2021-04-10 NOTE — ED Provider Notes (Signed)
[name]    CSN: [id number] Arrival date & time: 04/10/21  3474      History   Chief Complaint Chief Complaint  Patient presents with   Cough   Nasal Congestion   Headache    HPI NAZARENE Bentley is a 80 y.o. female.  Patient presents with 2-week history of sinus congestion, runny nose, postnasal drip, sinus pressure, cough.  Several OTC medications attempted without relief.  No fever, shortness of breath, rash, vomiting, diarrhea or other symptoms.  Patient requests treatment with prednisone; states this helps her symptoms.    The history is provided by the patient and medical records.   Past Medical History:  Diagnosis Date   Allergic genetic state    Arthritis    Bladder spasms    GERD (gastroesophageal reflux disease)     There are no problems to display for this patient.   Past Surgical History:  Procedure Laterality Date   COLONOSCOPY     COLONOSCOPY WITH PROPOFOL N/A 03/26/2018   Procedure: COLONOSCOPY WITH PROPOFOL;  Surgeon: Lollie Sails, MD;  Location: [facility] ENDOSCOPY;  Service: Endoscopy;  Laterality: N/A;   ESOPHAGOGASTRODUODENOSCOPY     HERNIA REPAIR     TONSILLECTOMY      OB History   No obstetric history on file.      Home Medications    Prior to Admission medications   Medication Sig Start Date End Date Taking? Authorizing Provider  amoxicillin (AMOXIL) 875 MG tablet Take 1 tablet (875 mg total) by mouth 2 (two) times daily for 7 days. 04/10/21 04/17/21 Yes Sharion Balloon, NP  predniSONE (STERAPRED UNI-PAK 21 TAB) 10 MG (21) TBPK tablet Take by mouth daily. As directed 04/10/21  Yes Sharion Balloon, NP  ALPRAZolam Duanne Moron) 0.5 MG tablet Take 0.5 mg by mouth every 8 (eight) hours as needed for anxiety.    [provider]  aspirin 81 MG EC tablet Take by mouth.    [provider]  calcium-vitamin D (OSCAL WITH D) 500-200 MG-UNIT tablet Take 1 tablet by mouth 2 (two) times daily.    [provider]   Cyanocobalamin 1000 MCG TBCR Take by mouth.    [provider]  cyclobenzaprine (FLEXERIL) 5 MG tablet Take one tab twice a day, ( may cause drowsiness) for 15 days 07/19/19   [provider]  folic acid (FOLVITE) 1 MG tablet Take by mouth. 10/15/20 10/15/21  [provider]  hydroxychloroquine (PLAQUENIL) 200 MG tablet Take 1 tablet by mouth 2 (two) times daily. 10/15/20   [provider]  meloxicam (MOBIC) 7.5 MG tablet Take by mouth. 12/06/20   [provider]  methotrexate (RHEUMATREX) 2.5 MG tablet Take by mouth. 10/15/20   [provider]  OMEGA-3 FATTY ACIDS PO Take by mouth.    [provider]  pantoprazole (PROTONIX) 40 MG tablet Take 40 mg by mouth daily.    [provider]  vitamin C (ASCORBIC ACID) 500 MG tablet Take 500 mg by mouth daily.    [provider]    Family History Family History  Problem Relation Age of Onset   Breast cancer Sister 51    Social History Social History   Tobacco Use   Smoking status: Never   Smokeless tobacco: Never  Vaping Use   Vaping Use: Never used  Substance Use Topics   Alcohol use: Not Currently   Drug use: Not Currently     Allergies   Patient has no  known allergies.   Review of Systems Review of Systems  Constitutional:  Negative for chills and fever.  HENT:  Positive for congestion, postnasal drip, rhinorrhea and sinus pressure. Negative for ear pain and sore throat.   Respiratory:  Positive for cough. Negative for shortness of breath.   Cardiovascular:  Negative for chest pain and palpitations.  Gastrointestinal:  Negative for diarrhea and vomiting.  Skin:  Negative for color change and rash.  All other systems reviewed and are negative.   Physical Exam Triage Vital Signs ED Triage Vitals  Enc Vitals Group     BP 04/10/21 0921 (!) 147/75     Pulse Rate 04/10/21 0921 85     Resp 04/10/21 0921 18     Temp 04/10/21 0921 98.3 F (36.8 C)      Temp Source 04/10/21 0921 Oral     SpO2 04/10/21 0921 95 %     Weight --      Height --      Head Circumference --      Peak Flow --      Pain Score 04/10/21 0923 0     Pain Loc --      Pain Edu? --      Excl. in [place]? --    No data found.  Updated Vital Signs BP (!) 147/75 (BP Location: Left Arm)    Pulse 85    Temp 98.3 F (36.8 C) (Oral)    Resp 18    SpO2 95%   Visual Acuity Right Eye Distance:   Left Eye Distance:   Bilateral Distance:    Right Eye Near:   Left Eye Near:    Bilateral Near:     Physical Exam Vitals and nursing note reviewed.  Constitutional:      General: She is not in acute distress.    Appearance: She is well-developed.  HENT:     Right Ear: Tympanic membrane normal.     Left Ear: Tympanic membrane normal.     Nose: Congestion and rhinorrhea present.     Mouth/Throat:     Mouth: Mucous membranes are moist.     Pharynx: Oropharynx is clear.  Cardiovascular:     Rate and Rhythm: Normal rate and regular rhythm.     Heart sounds: Normal heart sounds.  Pulmonary:     Effort: Pulmonary effort is normal. No respiratory distress.     Breath sounds: Normal breath sounds.  Musculoskeletal:     Cervical back: Neck supple.  Skin:    General: Skin is warm and dry.  Neurological:     Mental Status: She is alert.  Psychiatric:        Mood and Affect: Mood normal.        Behavior: Behavior normal.     UC Treatments / Results  Labs (all labs ordered are listed, but only abnormal results are displayed) Labs Reviewed - No data to display  EKG   Radiology No results found.  Procedures Procedures (including critical care time)  Medications Ordered in UC Medications - No data to display  Initial Impression / Assessment and Plan / UC Course  I have reviewed the triage vital signs and the nursing notes.  Pertinent labs & imaging results that were available during my care of the patient were reviewed by me and considered in my medical decision  making (see chart for details).   Acute sinusitis.  Patient has been symptomatic for 2 weeks.  She has not improved with  several OTC medications.  Treating with amoxicillin and prednisone.  Instructed patient to follow-up with her PCP if her symptoms are not improving.  She agrees to plan of care.   Final Clinical Impressions(s) / UC Diagnoses   Final diagnoses:  Acute non-recurrent maxillary sinusitis     Discharge Instructions      Take the amoxicillin and prednisone as directed.  Follow up with your primary care provider if your symptoms are not improving.         ED Prescriptions     Medication Sig Dispense Auth. Provider   amoxicillin (AMOXIL) 875 MG tablet Take 1 tablet (875 mg total) by mouth 2 (two) times daily for 7 days. 14 tablet Sharion Balloon, NP   predniSONE (STERAPRED UNI-PAK 21 TAB) 10 MG (21) TBPK tablet Take by mouth daily. As directed 21 tablet Sharion Balloon, NP      PDMP not reviewed this encounter.   Sharion Balloon, NP 04/10/21 1001

## 2021-04-10 NOTE — ED Triage Notes (Signed)
Pt here with sinus congestion, chest congestion and headache x 2 weeks.

## 2021-04-10 NOTE — Discharge Instructions (Addendum)
Take the amoxicillin and prednisone as directed.  Follow up with your primary care provider if your symptoms are not improving.    

## 2021-05-10 IMAGING — MG DIGITAL SCREENING BILAT W/ TOMO W/ CAD
8 series · 8 of 24 positions shown · non-contrast
Comparison: Previous exam(s).

CLINICAL DATA: Screening.

EXAM:
DIGITAL SCREENING BILATERAL MAMMOGRAM WITH TOMO AND CAD

[L CC synth-2D]
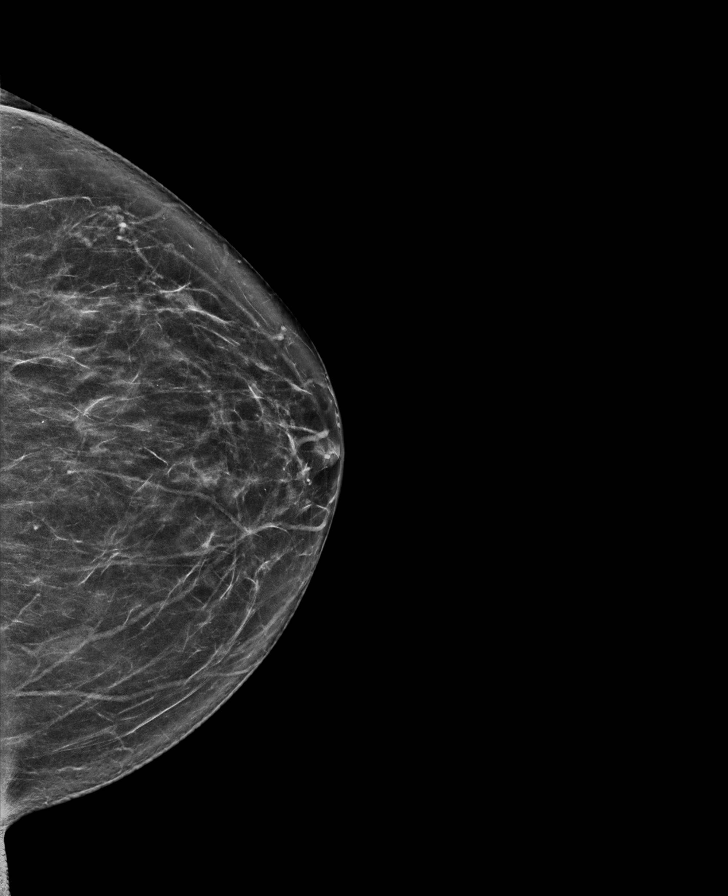

[L MLO synth-2D]
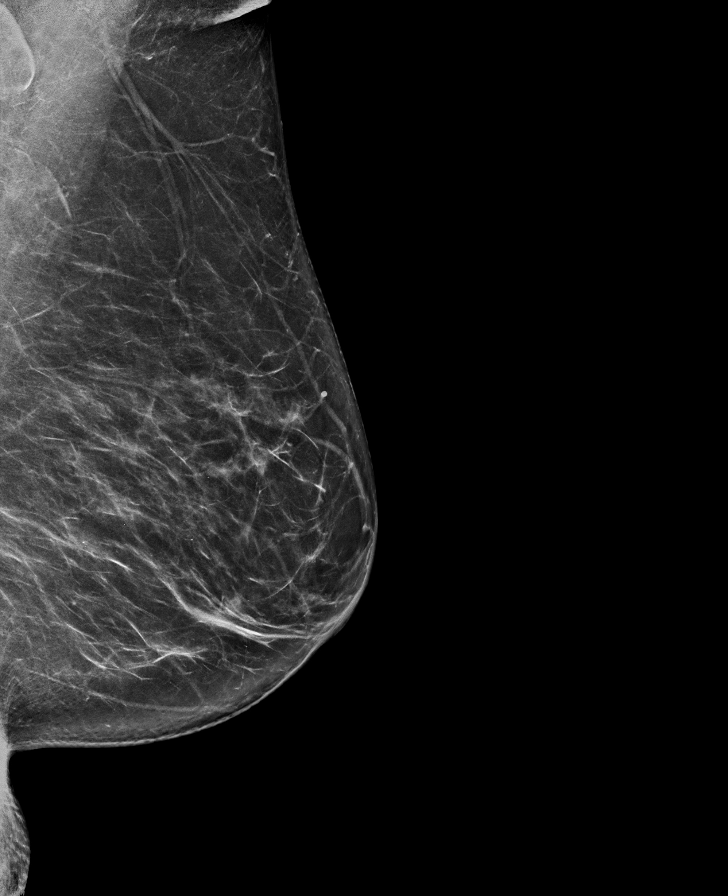

[R MLO synth-2D]
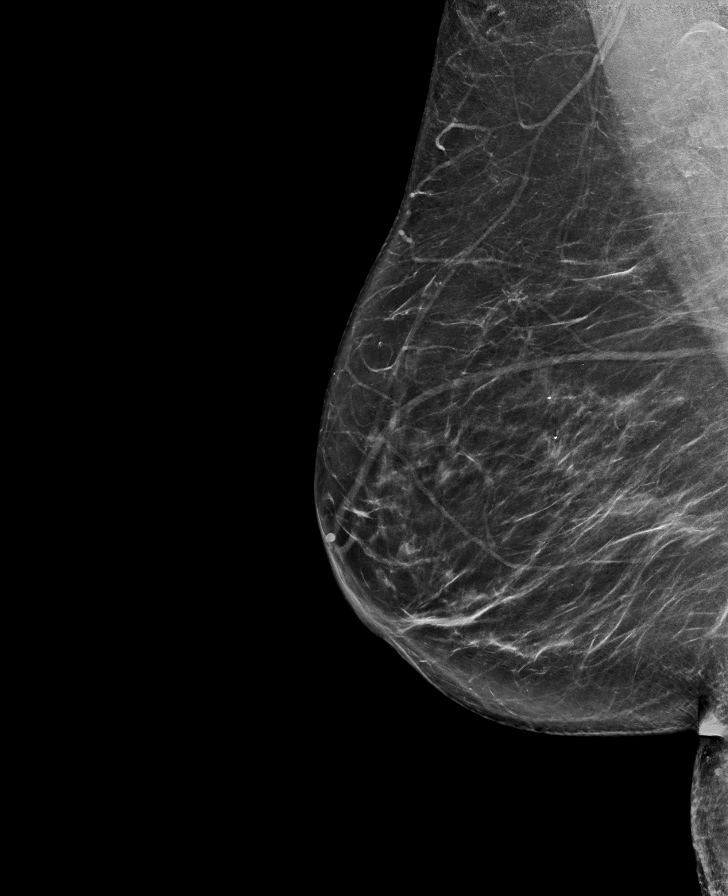

[R CC synth-2D]
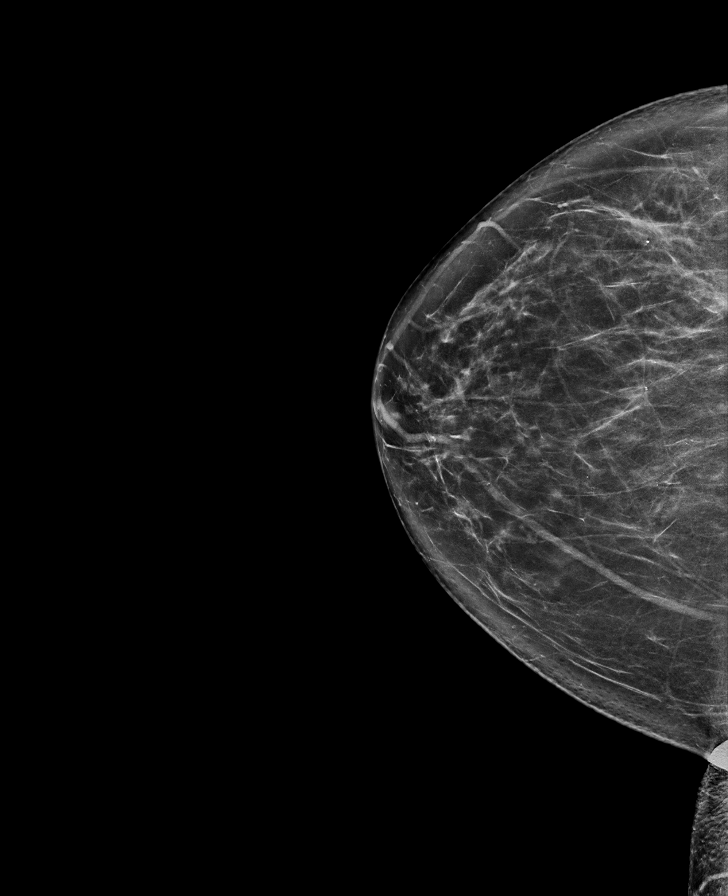

[L CC tomo · tomo slice 37/72.0]
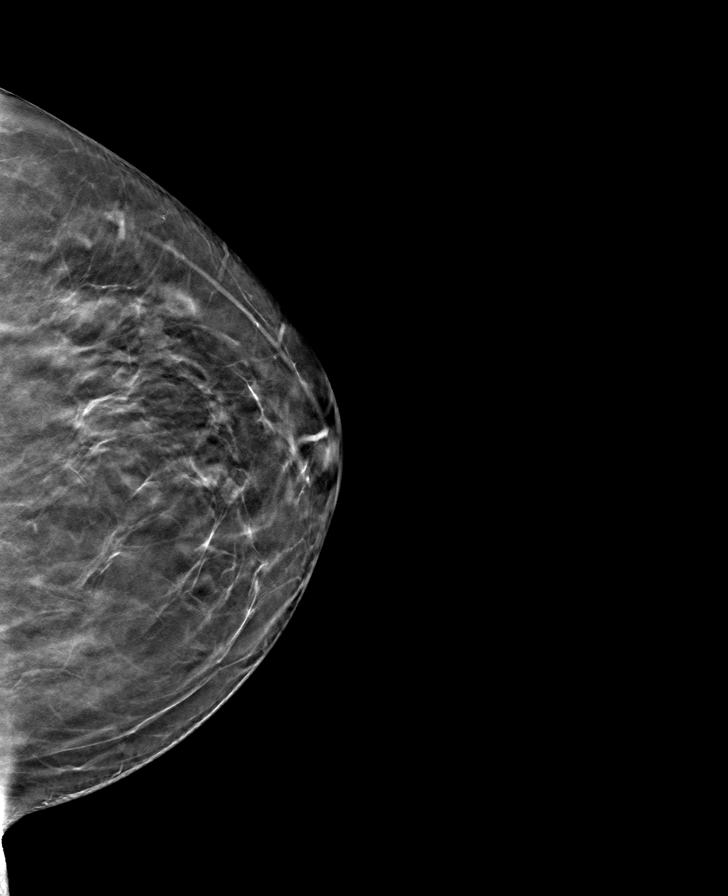

[R CC tomo · tomo slice 41/80.0]
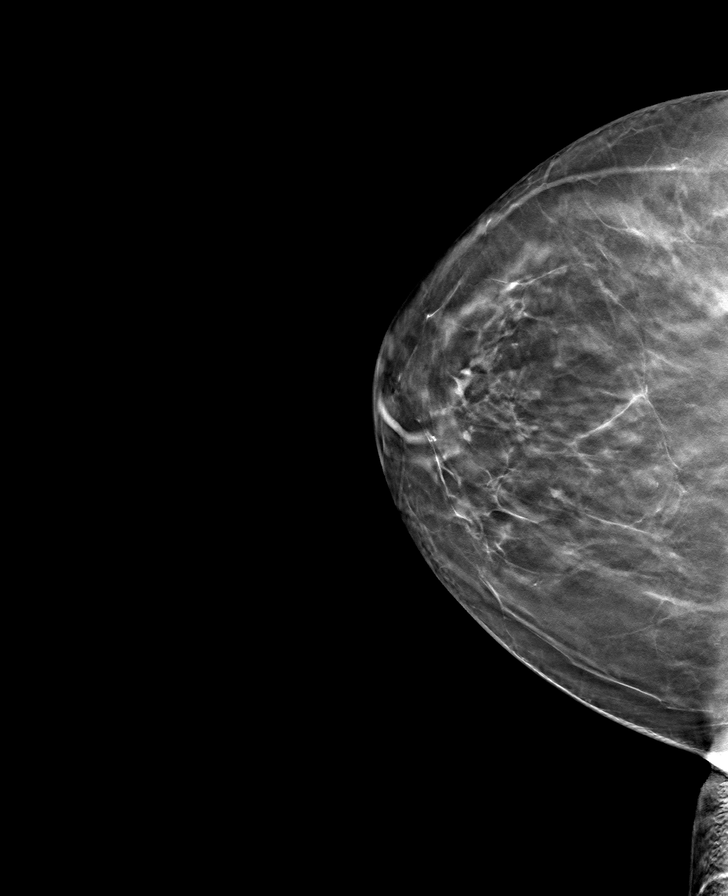

[R MLO tomo · tomo slice 41/80.0]
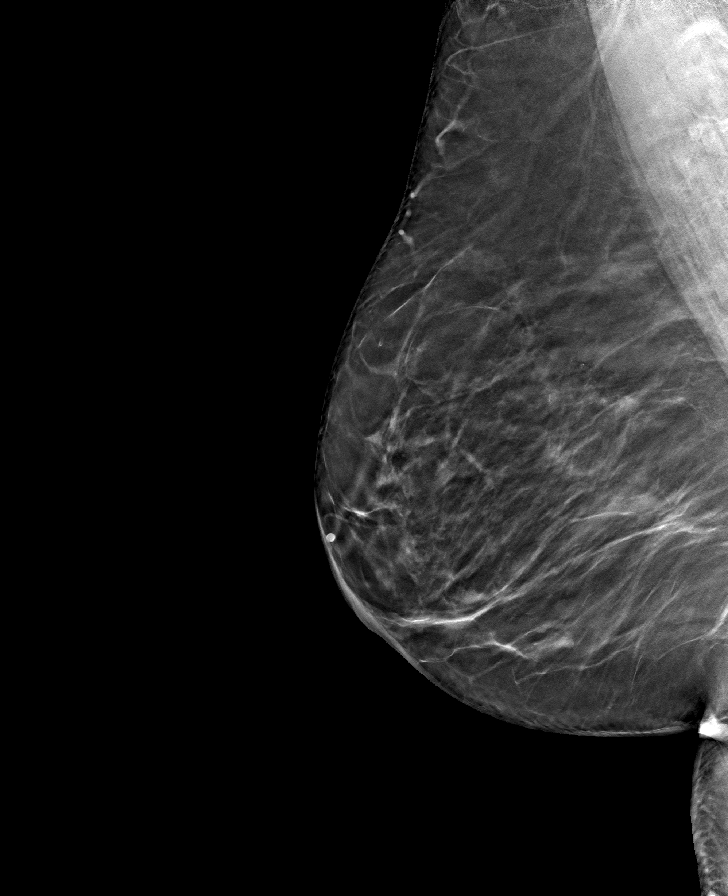

[L MLO tomo · tomo slice 41/80.0]
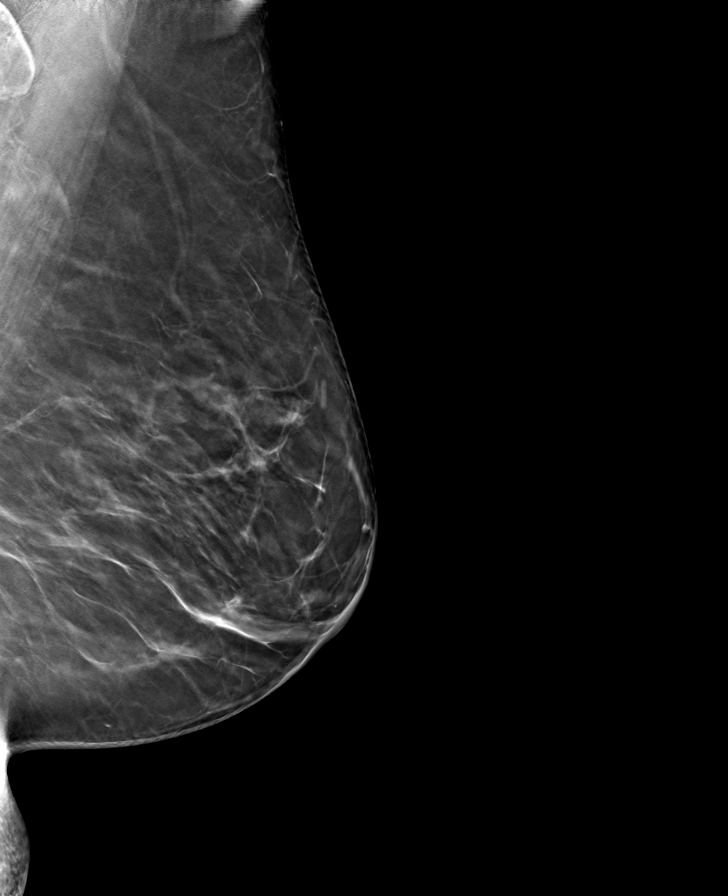

[8 of 24 positions shown; findings below may reference images not displayed]

ACR Breast Density Category b: There are scattered areas of
fibroglandular density.
FINDINGS: There are no findings suspicious for malignancy. Images were
processed with CAD.
IMPRESSION: No mammographic evidence of malignancy. A result letter of this
screening mammogram will be mailed directly to the patient.

RECOMMENDATION:
Screening mammogram in one year. (Code:CN-U-775)

BI-RADS CATEGORY  1: Negative.

## 2021-06-24 DIAGNOSIS — Z79899 Other long term (current) drug therapy: Secondary | ICD-10-CM | POA: Diagnosis not present

## 2021-06-24 DIAGNOSIS — M0579 Rheumatoid arthritis with rheumatoid factor of multiple sites without organ or systems involvement: Secondary | ICD-10-CM | POA: Diagnosis not present

## 2021-06-29 DIAGNOSIS — Z86010 Personal history of colonic polyps: Secondary | ICD-10-CM | POA: Diagnosis not present

## 2021-07-07 ENCOUNTER — Ambulatory Visit
Admission: EM | Admit: 2021-07-07 | Discharge: 2021-07-07 | Disposition: A | Discharge status: Home / Self Care | Payer: Medicare HMO | Attending: Emergency Medicine | Admitting: Emergency Medicine

## 2021-07-07 ENCOUNTER — Encounter: Payer: Self-pay | Admitting: Emergency Medicine

## 2021-07-07 ENCOUNTER — Other Ambulatory Visit: Payer: Self-pay

## 2021-07-07 DIAGNOSIS — J209 Acute bronchitis, unspecified: Secondary | ICD-10-CM

## 2021-07-07 DIAGNOSIS — J0100 Acute maxillary sinusitis, unspecified: Secondary | ICD-10-CM | POA: Diagnosis not present

## 2021-07-07 MED ORDER — ALBUTEROL SULFATE HFA 108 (90 BASE) MCG/ACT IN AERS: 1.0000 | INHALATION_SPRAY | Freq: Four times a day (QID) | RESPIRATORY_TRACT | 0 refills | Status: DC | PRN

## 2021-07-07 MED ORDER — AZITHROMYCIN 250 MG PO TABS: 250.0000 mg | ORAL_TABLET | Freq: Every day | ORAL | 0 refills | Status: DC

## 2021-07-07 MED ORDER — PREDNISONE 10 MG PO TABS: 40.0000 mg | ORAL_TABLET | Freq: Every day | ORAL | 0 refills | Status: AC

## 2021-07-07 NOTE — Discharge Instructions (Addendum)
Use your albuterol inhaler.  Take the prednisone and Zithromax as directed.  Follow up with your primary care provider.   ? ?

## 2021-07-07 NOTE — ED Provider Notes (Signed)
?[facility] ? ? ? ?CSN: [id number] ?Arrival date & time: 07/07/21  1307 ? ? ?  ? ?History   ?Chief Complaint ?Chief Complaint  ?Patient presents with  ? Sinus Pressure   ? Cough  ? ? ?HPI ?Kim Bentley is a 81 y.o. female.  Patient presents with 2-week history of sinus congestion, sinus pressure, postnasal drip, cough, wheezing.  Treatment at home with occasional use of albuterol inhaler.  She also has taken NyQuil.  She reports possible low-grade fever.  No chest pain, shortness of breath, or other symptoms.  No history of smoking.  She reports no history of lung disease. ? ?The history is provided by medical records and the patient.  ? ?Past Medical History:  ?Diagnosis Date  ? Allergic genetic state   ? Arthritis   ? Bladder spasms   ? GERD (gastroesophageal reflux disease)   ? ? ?There are no problems to display for this patient. ? ? ?Past Surgical History:  ?Procedure Laterality Date  ? COLONOSCOPY    ? COLONOSCOPY WITH PROPOFOL N/A 03/26/2018  ? Procedure: COLONOSCOPY WITH PROPOFOL;  Surgeon: Lollie Sails, MD;  Location: [facility] ENDOSCOPY;  Service: Endoscopy;  Laterality: N/A;  ? ESOPHAGOGASTRODUODENOSCOPY    ? HERNIA REPAIR    ? TONSILLECTOMY    ? ? ?OB History   ?No obstetric history on file. ?  ? ? ? ?Home Medications   ? ?Prior to Admission medications   ?Medication Sig Start Date End Date Taking? Authorizing Provider  ?albuterol (VENTOLIN HFA) 108 (90 Base) MCG/ACT inhaler Inhale 1-2 puffs into the lungs every 6 (six) hours as needed for wheezing or shortness of breath. 07/07/21  Yes Sharion Balloon, NP  ?azithromycin (ZITHROMAX) 250 MG tablet Take 1 tablet (250 mg total) by mouth daily. Take first 2 tablets together, then 1 every day until finished. 07/07/21  Yes Sharion Balloon, NP  ?predniSONE (DELTASONE) 10 MG tablet Take 4 tablets (40 mg total) by mouth daily for 5 days. 07/07/21 07/12/21 Yes Sharion Balloon, NP  ?ALPRAZolam (XANAX) 0.5 MG tablet Take 0.5 mg by mouth every 8 (eight) hours as  needed for anxiety.    [provider]  ?aspirin 81 MG EC tablet Take by mouth.    [provider]  ?calcium-vitamin D (OSCAL WITH D) 500-200 MG-UNIT tablet Take 1 tablet by mouth 2 (two) times daily.    [provider]  ?Cyanocobalamin 1000 MCG TBCR Take by mouth.    [provider]  ?cyclobenzaprine (FLEXERIL) 5 MG tablet Take one tab twice a day, ( may cause drowsiness) for 15 days 07/19/19   [provider]  ?folic acid (FOLVITE) 1 MG tablet Take by mouth. 10/15/20 10/15/21  [provider]  ?hydroxychloroquine (PLAQUENIL) 200 MG tablet Take 1 tablet by mouth 2 (two) times daily. 10/15/20   [provider]  ?meloxicam (MOBIC) 7.5 MG tablet Take by mouth. 12/06/20   [provider]  ?methotrexate (RHEUMATREX) 2.5 MG tablet Take by mouth. 10/15/20   [provider]  ?OMEGA-3 FATTY ACIDS PO Take by mouth.    [provider]  ?pantoprazole (PROTONIX) 40 MG tablet Take 40 mg by mouth daily.    [provider]  ?vitamin C (ASCORBIC ACID) 500 MG tablet Take 500 mg by mouth daily.    [provider]  ? ? ?Family History ?Family History  ?Problem Relation Age of Onset  ? Breast cancer Sister 39  ? ? ?Social History ?Social  History  ? ?Tobacco Use  ? Smoking status: Never  ? Smokeless tobacco: Never  ?Vaping Use  ? Vaping Use: Never used  ?Substance Use Topics  ? Alcohol use: Not Currently  ? Drug use: Not Currently  ? ? ? ?Allergies   ?Patient has no known allergies. ? ? ?Review of Systems ?Review of Systems  ?Constitutional:  Negative for chills and fever.  ?HENT:  Positive for congestion, postnasal drip and sinus pressure. Negative for ear pain and sore throat.   ?Respiratory:  Positive for cough and wheezing. Negative for shortness of breath.   ?Cardiovascular:  Negative for chest pain and palpitations.  ?Gastrointestinal:  Negative for diarrhea and vomiting.  ?Skin:  Negative for color change and rash.  ?All other  systems reviewed and are negative. ? ? ?Physical Exam ?Triage Vital Signs ?ED Triage Vitals [07/07/21 1353]  ?Enc Vitals Group  ?   BP (!) 149/75  ?   Pulse Rate 81  ?   Resp 18  ?   Temp 98.2 ?F (36.8 ?C)  ?   Temp src   ?   SpO2 93 %  ?   Weight   ?   Height   ?   Head Circumference   ?   Peak Flow   ?   Pain Score   ?   Pain Loc   ?   Pain Edu?   ?   Excl. in [place]?   ? ?No data found. ? ?Updated Vital Signs ?BP (!) 149/75   Pulse 81   Temp 98.2 ?F (36.8 ?C)   Resp 18   SpO2 93%  ? ?Visual Acuity ?Right Eye Distance:   ?Left Eye Distance:   ?Bilateral Distance:   ? ?Right Eye Near:   ?Left Eye Near:    ?Bilateral Near:    ? ?Physical Exam ?Vitals and nursing note reviewed.  ?Constitutional:   ?   General: She is not in acute distress. ?   Appearance: She is well-developed. She is not ill-appearing.  ?HENT:  ?   Right Ear: Tympanic membrane normal.  ?   Left Ear: Tympanic membrane normal.  ?   Nose: Congestion present.  ?   Mouth/Throat:  ?   Mouth: Mucous membranes are moist.  ?   Pharynx: Oropharynx is clear.  ?Cardiovascular:  ?   Rate and Rhythm: Normal rate and regular rhythm.  ?   Heart sounds: Normal heart sounds.  ?Pulmonary:  ?   Effort: Pulmonary effort is normal. No respiratory distress.  ?   Breath sounds: Wheezing present.  ?   Comments: Scattered expiratory wheezes throughout. ?Musculoskeletal:  ?   Cervical back: Neck supple.  ?Skin: ?   General: Skin is warm and dry.  ?Neurological:  ?   Mental Status: She is alert.  ?Psychiatric:     ?   Mood and Affect: Mood normal.     ?   Behavior: Behavior normal.  ? ? ? ?UC Treatments / Results  ?Labs ?(all labs ordered are listed, but only abnormal results are displayed) ?Labs Reviewed - No data to display ? ?EKG ? ? ?Radiology ?No results found. ? ?Procedures ?Procedures (including critical care time) ? ?Medications Ordered in UC ?Medications - No data to display ? ?Initial Impression / Assessment and Plan / UC Course  ?I have reviewed the triage vital  signs and the nursing notes. ? ?Pertinent labs & imaging results that were available during my care of the patient were  reviewed by me and considered in my medical decision making (see chart for details). ? ?Acute bronchitis, acute sinusitis.  No respiratory distress.  Scattered wheezes throughout.  Treating with albuterol inhaler, prednisone burst, Zithromax.  Instructed patient to follow-up with her PCP in the next week for recheck.  ED precautions discussed.  Education provided on bronchitis and sinusitis.  Patient agrees to plan of care. ? ? ?Final Clinical Impressions(s) / UC Diagnoses  ? ?Final diagnoses:  ?Acute bronchitis, unspecified organism  ?Acute non-recurrent maxillary sinusitis  ? ? ? ?Discharge Instructions   ? ?  ?Use your albuterol inhaler.  Take the prednisone and Zithromax as directed.  Follow up with your primary care provider.   ? ? ? ? ? ?ED Prescriptions   ? ? Medication Sig Dispense Auth. Provider  ? predniSONE (DELTASONE) 10 MG tablet Take 4 tablets (40 mg total) by mouth daily for 5 days. 20 tablet Sharion Balloon, NP  ? azithromycin (ZITHROMAX) 250 MG tablet Take 1 tablet (250 mg total) by mouth daily. Take first 2 tablets together, then 1 every day until finished. 6 tablet Sharion Balloon, NP  ? albuterol (VENTOLIN HFA) 108 (90 Base) MCG/ACT inhaler Inhale 1-2 puffs into the lungs every 6 (six) hours as needed for wheezing or shortness of breath. 18 g Sharion Balloon, NP  ? ?  ? ?PDMP not reviewed this encounter. ?  ?Sharion Balloon, NP ?07/07/21 1442 ? ?

## 2021-07-07 NOTE — ED Triage Notes (Signed)
Pt c/o cough, chest congestion, and sinus pressure x 2 weeks.  ?

## 2021-07-14 DIAGNOSIS — R059 Cough, unspecified: Secondary | ICD-10-CM | POA: Diagnosis not present

## 2021-07-14 DIAGNOSIS — J9811 Atelectasis: Secondary | ICD-10-CM | POA: Diagnosis not present

## 2021-07-14 DIAGNOSIS — I517 Cardiomegaly: Secondary | ICD-10-CM | POA: Diagnosis not present

## 2021-08-13 DIAGNOSIS — D225 Melanocytic nevi of trunk: Secondary | ICD-10-CM | POA: Diagnosis not present

## 2021-08-13 DIAGNOSIS — D2272 Melanocytic nevi of left lower limb, including hip: Secondary | ICD-10-CM | POA: Diagnosis not present

## 2021-08-13 DIAGNOSIS — D2271 Melanocytic nevi of right lower limb, including hip: Secondary | ICD-10-CM | POA: Diagnosis not present

## 2021-08-13 DIAGNOSIS — L821 Other seborrheic keratosis: Secondary | ICD-10-CM | POA: Diagnosis not present

## 2021-08-13 DIAGNOSIS — D2261 Melanocytic nevi of right upper limb, including shoulder: Secondary | ICD-10-CM | POA: Diagnosis not present

## 2021-08-13 DIAGNOSIS — D2262 Melanocytic nevi of left upper limb, including shoulder: Secondary | ICD-10-CM | POA: Diagnosis not present

## 2021-08-13 DIAGNOSIS — L309 Dermatitis, unspecified: Secondary | ICD-10-CM | POA: Diagnosis not present

## 2021-08-24 ENCOUNTER — Encounter: Payer: Self-pay | Admitting: Emergency Medicine

## 2021-08-24 ENCOUNTER — Ambulatory Visit
Admission: EM | Admit: 2021-08-24 | Discharge: 2021-08-24 | Disposition: A | Discharge status: Home / Self Care | Payer: Medicare HMO | Attending: Emergency Medicine | Admitting: Emergency Medicine

## 2021-08-24 DIAGNOSIS — Z20818 Contact with and (suspected) exposure to other bacterial communicable diseases: Secondary | ICD-10-CM | POA: Diagnosis not present

## 2021-08-24 DIAGNOSIS — J029 Acute pharyngitis, unspecified: Secondary | ICD-10-CM

## 2021-08-24 DIAGNOSIS — R030 Elevated blood-pressure reading, without diagnosis of hypertension: Secondary | ICD-10-CM | POA: Diagnosis not present

## 2021-08-24 LAB — POCT RAPID STREP A (OFFICE): Rapid Strep A Screen: NEGATIVE

## 2021-08-24 MED ORDER — AMOXICILLIN 500 MG PO CAPS: 500.0000 mg | ORAL_CAPSULE | Freq: Two times a day (BID) | ORAL | 0 refills | Status: AC

## 2021-08-24 NOTE — ED Provider Notes (Signed)
?[facility] ? ? ? ?CSN: [id number] ?Arrival date & time: 08/24/21  1715 ? ? ?  ? ?History   ?Chief Complaint ?Chief Complaint  ?Patient presents with  ? Sore Throat  ? ? ?HPI ?Kim Bentley is a 81 y.o. female.  Patient presents with 2-day history of sore throat.  She denies fever, rash, cough, difficulty breathing, vomiting, diarrhea, or other symptoms.  No OTC medications taken today.  Her grandson was diagnosed with strep throat today. ? ?The history is provided by the patient and medical records.  ? ?Past Medical History:  ?Diagnosis Date  ? Allergic genetic state   ? Arthritis   ? Bladder spasms   ? GERD (gastroesophageal reflux disease)   ? ? ?There are no problems to display for this patient. ? ? ?Past Surgical History:  ?Procedure Laterality Date  ? COLONOSCOPY    ? COLONOSCOPY WITH PROPOFOL N/A 03/26/2018  ? Procedure: COLONOSCOPY WITH PROPOFOL;  Surgeon: Lollie Sails, MD;  Location: [facility] ENDOSCOPY;  Service: Endoscopy;  Laterality: N/A;  ? ESOPHAGOGASTRODUODENOSCOPY    ? HERNIA REPAIR    ? TONSILLECTOMY    ? ? ?OB History   ?No obstetric history on file. ?  ? ? ? ?Home Medications   ? ?Prior to Admission medications   ?Medication Sig Start Date End Date Taking? Authorizing Provider  ?amoxicillin (AMOXIL) 500 MG capsule Take 1 capsule (500 mg total) by mouth 2 (two) times daily for 10 days. 08/24/21 09/03/21 Yes Sharion Balloon, NP  ?albuterol (VENTOLIN HFA) 108 (90 Base) MCG/ACT inhaler Inhale 1-2 puffs into the lungs every 6 (six) hours as needed for wheezing or shortness of breath. 07/07/21   Sharion Balloon, NP  ?ALPRAZolam (XANAX) 0.5 MG tablet Take 0.5 mg by mouth every 8 (eight) hours as needed for anxiety.    [provider]  ?aspirin 81 MG EC tablet Take by mouth.    [provider]  ?azithromycin (ZITHROMAX) 250 MG tablet Take 1 tablet (250 mg total) by mouth daily. Take first 2 tablets together, then 1 every day until finished. 07/07/21   Sharion Balloon, NP  ?calcium-vitamin  D (OSCAL WITH D) 500-200 MG-UNIT tablet Take 1 tablet by mouth 2 (two) times daily.    [provider]  ?Cyanocobalamin 1000 MCG TBCR Take by mouth.    [provider]  ?cyclobenzaprine (FLEXERIL) 5 MG tablet Take one tab twice a day, ( may cause drowsiness) for 15 days 07/19/19   [provider]  ?folic acid (FOLVITE) 1 MG tablet Take by mouth. 10/15/20 10/15/21  [provider]  ?hydroxychloroquine (PLAQUENIL) 200 MG tablet Take 1 tablet by mouth 2 (two) times daily. 10/15/20   [provider]  ?meloxicam (MOBIC) 7.5 MG tablet Take by mouth. 12/06/20   [provider]  ?methotrexate (RHEUMATREX) 2.5 MG tablet Take by mouth. 10/15/20   [provider]  ?OMEGA-3 FATTY ACIDS PO Take by mouth.    [provider]  ?pantoprazole (PROTONIX) 40 MG tablet Take 40 mg by mouth daily.    [provider]  ?vitamin C (ASCORBIC ACID) 500 MG tablet Take 500 mg by mouth daily.    [provider]  ? ? ?Family History ?Family History  ?Problem Relation Age of Onset  ? Breast cancer Sister 54  ? ? ?Social History ?Social History  ? ?Tobacco Use  ? Smoking status: Never  ? Smokeless tobacco: Never  ?Vaping Use  ? Vaping Use: Never used  ?  Substance Use Topics  ? Alcohol use: Not Currently  ? Drug use: Not Currently  ? ? ? ?Allergies   ?Patient has no known allergies. ? ? ?Review of Systems ?Review of Systems  ?Constitutional:  Negative for chills and fever.  ?HENT:  Positive for sore throat. Negative for ear pain.   ?Respiratory:  Negative for cough and shortness of breath.   ?Cardiovascular:  Negative for chest pain and palpitations.  ?Gastrointestinal:  Negative for diarrhea and vomiting.  ?Skin:  Negative for color change and rash.  ?All other systems reviewed and are negative. ? ? ?Physical Exam ?Triage Vital Signs ?ED Triage Vitals  ?Enc Vitals Group  ?   BP 08/24/21 1909 (!) 170/91  ?   Pulse Rate 08/24/21 1909 84  ?   Resp 08/24/21 1909 18  ?    Temp 08/24/21 1909 98.6 ?F (37 ?C)  ?   Temp src --   ?   SpO2 08/24/21 1909 96 %  ?   Weight --   ?   Height --   ?   Head Circumference --   ?   Peak Flow --   ?   Pain Score 08/24/21 1836 4  ?   Pain Loc --   ?   Pain Edu? --   ?   Excl. in [place]? --   ? ?No data found. ? ?Updated Vital Signs ?BP (!) 161/83   Pulse 84   Temp 98.6 ?F (37 ?C)   Resp 18   SpO2 96%  ? ?Visual Acuity ?Right Eye Distance:   ?Left Eye Distance:   ?Bilateral Distance:   ? ?Right Eye Near:   ?Left Eye Near:    ?Bilateral Near:    ? ?Physical Exam ?Vitals and nursing note reviewed.  ?Constitutional:   ?   General: She is not in acute distress. ?   Appearance: Normal appearance. She is well-developed. She is not ill-appearing.  ?HENT:  ?   Right Ear: Tympanic membrane normal.  ?   Left Ear: Tympanic membrane normal.  ?   Nose: Nose normal.  ?   Mouth/Throat:  ?   Mouth: Mucous membranes are moist.  ?   Pharynx: Posterior oropharyngeal erythema present.  ?Cardiovascular:  ?   Rate and Rhythm: Normal rate and regular rhythm.  ?   Heart sounds: Normal heart sounds.  ?Pulmonary:  ?   Effort: Pulmonary effort is normal. No respiratory distress.  ?   Breath sounds: Normal breath sounds.  ?Musculoskeletal:  ?   Cervical back: Neck supple.  ?Skin: ?   General: Skin is warm and dry.  ?Neurological:  ?   Mental Status: She is alert.  ?Psychiatric:     ?   Mood and Affect: Mood normal.  ? ? ? ?UC Treatments / Results  ?Labs ?(all labs ordered are listed, but only abnormal results are displayed) ?Labs Reviewed  ?POCT RAPID STREP A (OFFICE) - Normal  ? ? ?EKG ? ? ?Radiology ?No results found. ? ?Procedures ?Procedures (including critical care time) ? ?Medications Ordered in UC ?Medications - No data to display ? ?Initial Impression / Assessment and Plan / UC Course  ?I have reviewed the triage vital signs and the nursing notes. ? ?Pertinent labs & imaging results that were available during my care of the patient were reviewed by me and considered in my  medical decision making (see chart for details). ? ?Sore throat, exposure to strep.  Elevated blood pressure reading.  Patient is accompanied by her grandson who tested positive for strep throat now.  Treating her with amoxicillin.  Discussed Tylenol as needed.  Instructed patient to follow-up with her PCP if her symptoms are not improving.  Also discussed that her blood pressure is elevated today and needs to be rechecked by her PCP.  She agrees to plan of care. ? ? ?Final Clinical Impressions(s) / UC Diagnoses  ? ?Final diagnoses:  ?Sore throat  ?Exposure to strep throat  ?Elevated blood pressure reading  ? ? ? ?Discharge Instructions   ? ?  ?Take the amoxicillin as directed.  Take Tylenol as needed for fever or discomfort.  Follow-up with your primary care provider if your symptoms are not improving.   ? ?Your blood pressure is elevated today at 170/91; recheck 161/83.  Please have this rechecked by your primary care provider in 1-2 weeks.     ? ? ? ? ? ?ED Prescriptions   ? ? Medication Sig Dispense Auth. Provider  ? amoxicillin (AMOXIL) 500 MG capsule Take 1 capsule (500 mg total) by mouth 2 (two) times daily for 10 days. 20 capsule Sharion Balloon, NP  ? ?  ? ?PDMP not reviewed this encounter. ?  ?Sharion Balloon, NP ?08/24/21 1954 ? ?

## 2021-08-24 NOTE — Discharge Instructions (Addendum)
Take the amoxicillin as directed.  Take Tylenol as needed for fever or discomfort.  Follow-up with your primary care provider if your symptoms are not improving.   ? ?Your blood pressure is elevated today at 170/91; recheck 161/83.  Please have this rechecked by your primary care provider in 1-2 weeks.     ? ?

## 2021-08-24 NOTE — ED Triage Notes (Signed)
Pt here with sore throat since yesterday.  ?

## 2021-09-02 DIAGNOSIS — M0579 Rheumatoid arthritis with rheumatoid factor of multiple sites without organ or systems involvement: Secondary | ICD-10-CM | POA: Diagnosis not present

## 2021-09-02 DIAGNOSIS — Z79899 Other long term (current) drug therapy: Secondary | ICD-10-CM | POA: Diagnosis not present

## 2021-09-09 DIAGNOSIS — H5213 Myopia, bilateral: Secondary | ICD-10-CM | POA: Diagnosis not present

## 2021-09-17 ENCOUNTER — Other Ambulatory Visit: Payer: Self-pay | Admitting: Family Medicine

## 2021-09-17 DIAGNOSIS — Z1231 Encounter for screening mammogram for malignant neoplasm of breast: Secondary | ICD-10-CM

## 2021-09-24 ENCOUNTER — Encounter: Payer: Self-pay | Admitting: *Deleted

## 2021-09-24 DIAGNOSIS — M069 Rheumatoid arthritis, unspecified: Secondary | ICD-10-CM | POA: Diagnosis not present

## 2021-09-24 DIAGNOSIS — T7840XA Allergy, unspecified, initial encounter: Secondary | ICD-10-CM | POA: Diagnosis not present

## 2021-09-27 ENCOUNTER — Ambulatory Visit: Payer: Medicare HMO | Admitting: Anesthesiology

## 2021-09-27 ENCOUNTER — Encounter: Payer: Self-pay | Admitting: *Deleted

## 2021-09-27 ENCOUNTER — Ambulatory Visit
Admission: RE | Admit: 2021-09-27 | Discharge: 2021-09-27 | Disposition: A | Discharge status: Home / Self Care | Payer: Medicare HMO | Attending: Gastroenterology | Admitting: Gastroenterology

## 2021-09-27 ENCOUNTER — Encounter
Admission: RE | Disposition: A | Discharge status: Home / Self Care | Payer: Self-pay | Source: Home / Self Care | Attending: Gastroenterology

## 2021-09-27 DIAGNOSIS — K640 First degree hemorrhoids: Secondary | ICD-10-CM | POA: Insufficient documentation

## 2021-09-27 DIAGNOSIS — K649 Unspecified hemorrhoids: Secondary | ICD-10-CM | POA: Diagnosis not present

## 2021-09-27 DIAGNOSIS — E785 Hyperlipidemia, unspecified: Secondary | ICD-10-CM | POA: Diagnosis not present

## 2021-09-27 DIAGNOSIS — Z1211 Encounter for screening for malignant neoplasm of colon: Secondary | ICD-10-CM | POA: Insufficient documentation

## 2021-09-27 DIAGNOSIS — K5730 Diverticulosis of large intestine without perforation or abscess without bleeding: Secondary | ICD-10-CM | POA: Insufficient documentation

## 2021-09-27 DIAGNOSIS — D123 Benign neoplasm of transverse colon: Secondary | ICD-10-CM | POA: Diagnosis not present

## 2021-09-27 DIAGNOSIS — D122 Benign neoplasm of ascending colon: Secondary | ICD-10-CM | POA: Insufficient documentation

## 2021-09-27 DIAGNOSIS — Z86010 Personal history of colonic polyps: Secondary | ICD-10-CM | POA: Diagnosis not present

## 2021-09-27 DIAGNOSIS — K635 Polyp of colon: Secondary | ICD-10-CM | POA: Diagnosis not present

## 2021-09-27 DIAGNOSIS — K219 Gastro-esophageal reflux disease without esophagitis: Secondary | ICD-10-CM | POA: Insufficient documentation

## 2021-09-27 DIAGNOSIS — D124 Benign neoplasm of descending colon: Secondary | ICD-10-CM | POA: Diagnosis not present

## 2021-09-27 HISTORY — DX: Hyperlipidemia, unspecified: E78.5

## 2021-09-27 HISTORY — PX: COLONOSCOPY: SHX5424

## 2021-09-27 SURGERY — COLONOSCOPY
Anesthesia: General

## 2021-09-27 MED ORDER — PROPOFOL 500 MG/50ML IV EMUL
INTRAVENOUS | Status: DC | PRN
  Administered 2021-09-27: 150 ug/kg/min via INTRAVENOUS

## 2021-09-27 MED ORDER — PROPOFOL 10 MG/ML IV BOLUS
INTRAVENOUS | Status: DC | PRN
  Administered 2021-09-27: 20 mg via INTRAVENOUS
  Administered 2021-09-27: 80 mg via INTRAVENOUS

## 2021-09-27 MED ORDER — DEXMEDETOMIDINE (PRECEDEX) IN NS 20 MCG/5ML (4 MCG/ML) IV SYRINGE
PREFILLED_SYRINGE | INTRAVENOUS | Status: DC | PRN
  Administered 2021-09-27: 8 ug via INTRAVENOUS

## 2021-09-27 MED ORDER — SODIUM CHLORIDE 0.9 % IV SOLN: INTRAVENOUS | Status: DC

## 2021-09-27 MED ORDER — LIDOCAINE HCL (CARDIAC) PF 100 MG/5ML IV SOSY
PREFILLED_SYRINGE | INTRAVENOUS | Status: DC | PRN
  Administered 2021-09-27: 50 mg via INTRAVENOUS

## 2021-09-27 NOTE — Transfer of Care (Signed)
Immediate Anesthesia Transfer of Care Note  Patient: Kim Bentley  Procedure(s) Performed: COLONOSCOPY  Patient Location: PACU  Anesthesia Type:General  Level of Consciousness: sedated  Airway & Oxygen Therapy: Patient Spontanous Breathing  Post-op Assessment: Report given to RN and Post -op Vital signs reviewed and stable  Post vital signs: Reviewed and stable  Last Vitals:  Vitals Value Taken Time  BP 115/69 09/27/21 1126  Temp    Pulse 80 09/27/21 1126  Resp 30 09/27/21 1126  SpO2 91 % 09/27/21 1126  Vitals shown include unvalidated device data.  Last Pain:  Vitals:   09/27/21 1021  TempSrc: Temporal         Complications: No notable events documented.

## 2021-09-27 NOTE — Interval H&P Note (Signed)
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  The various methods of treatment have been discussed with the patient and family. After consideration of risks, benefits and other options for treatment, the patient has consented to  Procedure(s): COLONOSCOPY (N/A) as a surgical intervention.  The patient's history has been reviewed, patient examined, no change in status, stable for surgery.  I have reviewed the patient's chart and labs.  Questions were answered to the patient's satisfaction.     Lesly Rubenstein  Ok to proceed with colonoscopy

## 2021-09-27 NOTE — Op Note (Signed)
[facility] Gastroenterology Patient Name: Kim Bentley Procedure Date: 09/27/2021 10:49 AM MRN: [id number] Account #: [id number] Date of Birth: 1941-02-22 Admit Type: Outpatient Age: 81 Room: [facility] ENDO ROOM 3 Gender: Female Note Status: Finalized Instrument Name: Jasper Riling [id number] Procedure:             Colonoscopy Indications:           Surveillance: Personal history of adenomatous polyps                         on last colonoscopy > 3 years ago Providers:             Andrey Farmer MD, MD Referring MD:          Youlanda Roys. Lovie Macadamia, MD (Referring MD) Medicines:             Monitored Anesthesia Care Complications:         No immediate complications. Estimated blood loss:                         Minimal. Procedure:             Pre-Anesthesia Assessment:                        - Prior to the procedure, a History and Physical was                         performed, and patient medications and allergies were                         reviewed. The patient is competent. The risks and                         benefits of the procedure and the sedation options and                         risks were discussed with the patient. All questions                         were answered and informed consent was obtained.                         Patient identification and proposed procedure were                         verified by the physician, the nurse, the                         anesthesiologist, the anesthetist and the technician                         in the endoscopy suite. Mental Status Examination:                         alert and oriented. Airway Examination: normal                         oropharyngeal airway and neck mobility. Respiratory  Examination: clear to auscultation. CV Examination:                         normal. Prophylactic Antibiotics: The patient does not                         require prophylactic antibiotics. Prior                          Anticoagulants: The patient has taken no previous                         anticoagulant or antiplatelet agents. ASA Grade                         Assessment: III - A patient with severe systemic                         disease. After reviewing the risks and benefits, the                         patient was deemed in satisfactory condition to                         undergo the procedure. The anesthesia plan was to use                         monitored anesthesia care (MAC). Immediately prior to                         administration of medications, the patient was                         re-assessed for adequacy to receive sedatives. The                         heart rate, respiratory rate, oxygen saturations,                         blood pressure, adequacy of pulmonary ventilation, and                         response to care were monitored throughout the                         procedure. The physical status of the patient was                         re-assessed after the procedure.                        After obtaining informed consent, the colonoscope was                         passed under direct vision. Throughout the procedure,                         the patient's blood pressure, pulse, and oxygen  saturations were monitored continuously. The                         Colonoscope was introduced through the anus and                         advanced to the the cecum, identified by appendiceal                         orifice and ileocecal valve. The colonoscopy was                         performed without difficulty. The patient tolerated                         the procedure well. The quality of the bowel                         preparation was good. Findings:      The perianal and digital rectal examinations were normal.      A 3 mm polyp was found in the ascending colon. The polyp was sessile.       The polyp was removed with a cold snare.  Resection and retrieval were       complete. Estimated blood loss was minimal.      A 1 mm polyp was found in the ascending colon. The polyp was sessile.       The polyp was removed with a jumbo cold forceps. Resection and retrieval       were complete. Estimated blood loss was minimal.      A 1 mm polyp was found in the transverse colon. The polyp was sessile.       The polyp was removed with a jumbo cold forceps. Resection and retrieval       were complete. Estimated blood loss was minimal.      A 2 mm polyp was found in the transverse colon. The polyp was sessile.       The polyp was removed with a cold snare. Resection and retrieval were       complete. Estimated blood loss was minimal.      Two sessile polyps were found in the descending colon. The polyps were 2       to 3 mm in size. These polyps were removed with a cold snare. Resection       and retrieval were complete. Estimated blood loss was minimal.      A tattoo was seen in the descending colon. The tattoo site appeared       normal.      Multiple small-mouthed diverticula were found in the sigmoid colon.      Internal hemorrhoids were found during retroflexion. The hemorrhoids       were Grade I (internal hemorrhoids that do not prolapse).      The exam was otherwise without abnormality on direct and retroflexion       views. Impression:            - One 3 mm polyp in the ascending colon, removed with                         a cold snare. Resected and retrieved.                        -  One 1 mm polyp in the ascending colon, removed with                         a jumbo cold forceps. Resected and retrieved.                        - One 1 mm polyp in the transverse colon, removed with                         a jumbo cold forceps. Resected and retrieved.                        - One 2 mm polyp in the transverse colon, removed with                         a cold snare. Resected and retrieved.                        - Two 2 to 3  mm polyps in the descending colon,                         removed with a cold snare. Resected and retrieved.                        - A tattoo was seen in the descending colon. The                         tattoo site appeared normal.                        - Diverticulosis in the sigmoid colon.                        - Internal hemorrhoids.                        - The examination was otherwise normal on direct and                         retroflexion views. Recommendation:        - Discharge patient to home.                        - Resume previous diet.                        - Continue present medications.                        - Await pathology results.                        - Repeat colonoscopy is not recommended due to current                         age (4 years or older) for surveillance.                        - Return to referring physician as previously  scheduled. Procedure Code(s):     --- Professional ---                        [phone], Colonoscopy, flexible; with removal of                         tumor(s), polyp(s), or other lesion(s) by snare                         technique                        [id number], 37, Colonoscopy, flexible; with biopsy, single                         or multiple Diagnosis Code(s):     --- Professional ---                        K63.5, Polyp of colon                        Z86.010, Personal history of colonic polyps                        K64.0, First degree hemorrhoids                        K57.30, Diverticulosis of large intestine without                         perforation or abscess without bleeding CPT copyright 2019 American Medical Association. All rights reserved. The codes documented in this report are preliminary and upon coder review may  be revised to meet current compliance requirements. Andrey Farmer MD, MD 09/27/2021 11:25:34 AM Number of Addenda: 0 Note Initiated On: 09/27/2021 10:49 AM Total  Procedure Duration: 0 hours 19 minutes 53 seconds  Estimated Blood Loss:  Estimated blood loss was minimal.      [facility]

## 2021-09-27 NOTE — H&P (Signed)
Outpatient short stay form Pre-procedure 09/27/2021  Lesly Rubenstein, MD  Primary Physician: Juluis Pitch, MD  Reason for visit:  Surveillance  History of present illness:    81 y/o lady with history of RA ad multiple adenomatous polyps with last colonoscopy in 2019 with some Ta's here for surveillance colonoscopy. No blood thinners. No family history of GI malignancies. No blood thinners. No significant abdominal surgeries besides inguinal hernia repair when she was a kid.    Current Facility-Administered Medications:    0.9 %  sodium chloride infusion, , Intravenous, Continuous, Felise Georgia, Hilton Cork, MD, Last Rate: 20 mL/hr at 09/27/21 1049, Restarted at 09/27/21 1050  Medications Prior to Admission  Medication Sig Dispense Refill Last Dose   albuterol (VENTOLIN HFA) 108 (90 Base) MCG/ACT inhaler Inhale 1-2 puffs into the lungs every 6 (six) hours as needed for wheezing or shortness of breath. 18 g 0 09/27/2021   aspirin 81 MG EC tablet Take by mouth.   Past Month   Cyanocobalamin 1000 MCG TBCR Take by mouth.   Past Week   hydroxychloroquine (PLAQUENIL) 200 MG tablet Take 1 tablet by mouth 2 (two) times daily.   Past Week   meloxicam (MOBIC) 7.5 MG tablet Take by mouth.   Past Month   methotrexate (RHEUMATREX) 2.5 MG tablet Take by mouth.   Past Week   ALPRAZolam (XANAX) 0.5 MG tablet Take 0.5 mg by mouth every 8 (eight) hours as needed for anxiety. (Patient not taking: Reported on 09/27/2021)   Not Taking   azithromycin (ZITHROMAX) 250 MG tablet Take 1 tablet (250 mg total) by mouth daily. Take first 2 tablets together, then 1 every day until finished. 6 tablet 0    calcium-vitamin D (OSCAL WITH D) 500-200 MG-UNIT tablet Take 1 tablet by mouth 2 (two) times daily.      cyclobenzaprine (FLEXERIL) 5 MG tablet Take one tab twice a day, ( may cause drowsiness) for 15 days      folic acid (FOLVITE) 1 MG tablet Take by mouth.      OMEGA-3 FATTY ACIDS PO Take by mouth.      pantoprazole  (PROTONIX) 40 MG tablet Take 40 mg by mouth daily. (Patient not taking: Reported on 09/27/2021)   Not Taking   vitamin C (ASCORBIC ACID) 500 MG tablet Take 500 mg by mouth daily.        No Known Allergies   Past Medical History:  Diagnosis Date   Allergic genetic state    Arthritis    Bladder spasms    GERD (gastroesophageal reflux disease)    HLD (hyperlipidemia)     Review of systems:  Otherwise negative.    Physical Exam  Gen: Alert, oriented. Appears stated age.  HEENT: PERRLA. Lungs: No respiratory distress CV: RRR Abd: soft, benign, no masses Ext: No edema    Planned procedures: Proceed with colonoscopy. The patient understands the nature of the planned procedure, indications, risks, alternatives and potential complications including but not limited to bleeding, infection, perforation, damage to internal organs and possible oversedation/side effects from anesthesia. The patient agrees and gives consent to proceed.  Please refer to procedure notes for findings, recommendations and patient disposition/instructions.     Lesly Rubenstein, MD [facility] Gastroenterology

## 2021-09-27 NOTE — Anesthesia Preprocedure Evaluation (Signed)
Anesthesia Evaluation  Patient identified by MRN, date of birth, ID band Patient awake    Reviewed: Allergy & Precautions, H&P , NPO status , Patient's Chart, lab work & pertinent test results, reviewed documented beta blocker date and time   Airway Mallampati: II   Neck ROM: full    Dental  (+) Poor Dentition   Pulmonary neg pulmonary ROS,    Pulmonary exam normal        Cardiovascular Exercise Tolerance: Good negative cardio ROS Normal cardiovascular exam Rhythm:regular Rate:Normal     Neuro/Psych negative neurological ROS  negative psych ROS   GI/Hepatic Neg liver ROS, GERD  Medicated,  Endo/Other  negative endocrine ROS  Renal/GU negative Renal ROS  negative genitourinary   Musculoskeletal   Abdominal   Peds  Hematology negative hematology ROS (+)   Anesthesia Other Findings Past Medical History: No date: Allergic genetic state No date: Arthritis No date: Bladder spasms No date: GERD (gastroesophageal reflux disease) No date: HLD (hyperlipidemia) Past Surgical History: No date: COLONOSCOPY 03/26/2018: COLONOSCOPY WITH PROPOFOL; N/A     Comment:  Procedure: COLONOSCOPY WITH PROPOFOL;  Surgeon:               Lollie Sails, MD;  Location: [facility] ENDOSCOPY;                Service: Endoscopy;  Laterality: N/A; No date: ESOPHAGOGASTRODUODENOSCOPY No date: HERNIA REPAIR No date: TONSILLECTOMY BMI    Body Mass Index: 29.66 kg/m     Reproductive/Obstetrics negative OB ROS                             Anesthesia Physical Anesthesia Plan  ASA: 2  Anesthesia Plan: General   Post-op Pain Management:    Induction:   PONV Risk Score and Plan:   Airway Management Planned:   Additional Equipment:   Intra-op Plan:   Post-operative Plan:   Informed Consent: I have reviewed the patients History and Physical, chart, labs and discussed the procedure including the risks,  benefits and alternatives for the proposed anesthesia with the patient or authorized representative who has indicated his/her understanding and acceptance.     Dental Advisory Given  Plan Discussed with: CRNA  Anesthesia Plan Comments:         Anesthesia Quick Evaluation

## 2021-09-28 ENCOUNTER — Encounter: Payer: Self-pay | Admitting: Gastroenterology

## 2021-09-28 NOTE — Anesthesia Postprocedure Evaluation (Signed)
Anesthesia Post Note  Patient: Kim Bentley  Procedure(s) Performed: COLONOSCOPY  Patient location during evaluation: PACU Anesthesia Type: General Level of consciousness: awake and alert Pain management: pain level controlled Vital Signs Assessment: post-procedure vital signs reviewed and stable Respiratory status: spontaneous breathing, nonlabored ventilation, respiratory function stable and patient connected to nasal cannula oxygen Cardiovascular status: blood pressure returned to baseline and stable Postop Assessment: no apparent nausea or vomiting Anesthetic complications: no   No notable events documented.   Last Vitals:  Vitals:   09/27/21 1126 09/27/21 1136  BP: 115/69 (!) 102/58  Pulse: 80 79  Resp: (!) 32   Temp: (!) 35.8 C   SpO2: 93% 96%    Last Pain:  Vitals:   09/27/21 1136  TempSrc:   PainSc: 0-No pain                 Molli Barrows

## 2021-09-29 LAB — SURGICAL PATHOLOGY

## 2021-10-13 ENCOUNTER — Ambulatory Visit
Admission: RE | Admit: 2021-10-13 | Discharge: 2021-10-13 | Disposition: A | Discharge status: Home / Self Care | Payer: Medicare HMO | Source: Ambulatory Visit | Attending: Family Medicine | Admitting: Family Medicine

## 2021-10-13 DIAGNOSIS — Z1231 Encounter for screening mammogram for malignant neoplasm of breast: Secondary | ICD-10-CM | POA: Diagnosis not present

## 2021-10-14 DIAGNOSIS — R053 Chronic cough: Secondary | ICD-10-CM | POA: Diagnosis not present

## 2021-10-14 DIAGNOSIS — Z79899 Other long term (current) drug therapy: Secondary | ICD-10-CM | POA: Diagnosis not present

## 2021-10-14 DIAGNOSIS — M0579 Rheumatoid arthritis with rheumatoid factor of multiple sites without organ or systems involvement: Secondary | ICD-10-CM | POA: Diagnosis not present

## 2021-10-14 DIAGNOSIS — M7521 Bicipital tendinitis, right shoulder: Secondary | ICD-10-CM | POA: Diagnosis not present

## 2021-11-08 ENCOUNTER — Ambulatory Visit
Admission: EM | Admit: 2021-11-08 | Discharge: 2021-11-08 | Disposition: A | Discharge status: Home / Self Care | Payer: Medicare HMO

## 2021-11-08 ENCOUNTER — Encounter: Payer: Self-pay | Admitting: Emergency Medicine

## 2021-11-08 DIAGNOSIS — J189 Pneumonia, unspecified organism: Secondary | ICD-10-CM | POA: Diagnosis not present

## 2021-11-08 MED ORDER — AMOXICILLIN-POT CLAVULANATE 875-125 MG PO TABS: 1.0000 | ORAL_TABLET | Freq: Two times a day (BID) | ORAL | 0 refills | Status: AC

## 2021-11-08 MED ORDER — AZITHROMYCIN 250 MG PO TABS: 250.0000 mg | ORAL_TABLET | Freq: Every day | ORAL | 0 refills | Status: DC

## 2021-11-08 MED ORDER — HYDROCOD POLI-CHLORPHE POLI ER 10-8 MG/5ML PO SUER: 5.0000 mL | Freq: Two times a day (BID) | ORAL | 0 refills | Status: DC | PRN

## 2021-11-08 NOTE — Discharge Instructions (Signed)
Today you are being treated for pneumonia based on your examination and your timeline of illness, unfortunately I am unable to confirm by x-ray  Take Augmentin every morning and every evening for 10 days  Take azithromycin as directed on packaging  Ideally should begin to see improvement in your symptoms in about 48 hours and steady progression from there  You may use Tussionex every 12 hours to help manage coughing, be mindful this medication may make you drowsy, may use over-the-counter Delsym in addition  You may continue Mucinex and NyQuil if they have been providing you some comfort  May attempt any of the following below:    You can take Tylenol and/or Ibuprofen as needed for fever reduction and pain relief.   For cough: honey 1/2 to 1 teaspoon (you can dilute the honey in water or another fluid).  You can also use guaifenesin and dextromethorphan for cough. You can use a humidifier for chest congestion and cough.  If you don't have a humidifier, you can sit in the bathroom with the hot shower running.      For sore throat: try warm salt water gargles, cepacol lozenges, throat spray, warm tea or water with lemon/honey, popsicles or ice, or OTC cold relief medicine for throat discomfort.   For congestion: take a daily anti-histamine like Zyrtec, Claritin, and a oral decongestant, such as pseudoephedrine.  You can also use Flonase 1-2 sprays in each nostril daily.   It is important to stay hydrated: drink plenty of fluids (water, gatorade/powerade/pedialyte, juices, or teas) to keep your throat moisturized and help further relieve irritation/discomfort.

## 2021-11-08 NOTE — ED Provider Notes (Signed)
Kim Bentley    CSN: [id number] Arrival date & time: 11/08/21  0539      History   Chief Complaint Chief Complaint  Patient presents with   Cough   Shortness of Breath    HPI Kim Bentley is a 81 y.o. female.   Patient presents with a productive cough with green sputum, chest congestion and generalized headaches for 3 to 4 weeks.  Has associated shortness of breath only when coughing and has been using inhaler for management which has been effective.  No known sick contacts.  Tolerating food and liquids.  Denies fever, chills, body aches, nasal congestion, sore throat, ear pain, wheezing, chest pain or tightness.  History of hyperlipidemia, GERD.  Patient is to be evaluated for asthma tomorrow by pulmonologist.  Past Medical History:  Diagnosis Date   Allergic genetic state    Arthritis    Bladder spasms    GERD (gastroesophageal reflux disease)    HLD (hyperlipidemia)     There are no problems to display for this patient.   Past Surgical History:  Procedure Laterality Date   COLONOSCOPY     COLONOSCOPY N/A 09/27/2021   Procedure: COLONOSCOPY;  Surgeon: Lesly Rubenstein, MD;  Location: [facility] ENDOSCOPY;  Service: Endoscopy;  Laterality: N/A;   COLONOSCOPY WITH PROPOFOL N/A 03/26/2018   Procedure: COLONOSCOPY WITH PROPOFOL;  Surgeon: Lollie Sails, MD;  Location: [facility] ENDOSCOPY;  Service: Endoscopy;  Laterality: N/A;   ESOPHAGOGASTRODUODENOSCOPY     HERNIA REPAIR     TONSILLECTOMY      OB History   No obstetric history on file.      Home Medications    Prior to Admission medications   Medication Sig Start Date End Date Taking? Authorizing Provider  albuterol (VENTOLIN HFA) 108 (90 Base) MCG/ACT inhaler Inhale 1-2 puffs into the lungs every 6 (six) hours as needed for wheezing or shortness of breath. 07/07/21  Yes Sharion Balloon, NP  amoxicillin-clavulanate (AUGMENTIN) 875-125 MG tablet Take 1 tablet by mouth every 12 (twelve) hours for 10 days.  11/08/21 11/18/21 Yes Caven Perine, Leitha Schuller, NP  aspirin 81 MG EC tablet Take by mouth.   Yes [provider]  calcium-vitamin D (OSCAL WITH D) 500-200 MG-UNIT tablet Take 1 tablet by mouth 2 (two) times daily.   Yes [provider]  chlorpheniramine-HYDROcodone (TUSSIONEX PENNKINETIC ER) 10-8 MG/5ML Take 5 mLs by mouth every 12 (twelve) hours as needed for cough. 11/08/21  Yes Love Milbourne, Leitha Schuller, NP  Cyanocobalamin 1000 MCG TBCR Take by mouth.   Yes [provider]  fluticasone-salmeterol (ADVAIR) 250-50 MCG/ACT AEPB Inhale into the lungs. 09/24/21 09/24/22 Yes [provider]  folic acid (FOLVITE) 1 MG tablet Take by mouth. 09/02/21 09/02/22 Yes [provider]  hydroxychloroquine (PLAQUENIL) 200 MG tablet Take 1 tablet by mouth 2 (two) times daily. 10/15/20  Yes [provider]  meloxicam (MOBIC) 7.5 MG tablet Take by mouth. 12/06/20  Yes [provider]  methotrexate (RHEUMATREX) 2.5 MG tablet Take by mouth. 10/15/20  Yes [provider]  OMEGA-3 FATTY ACIDS PO Take by mouth.   Yes [provider]  vitamin C (ASCORBIC ACID) 500 MG tablet Take 500 mg by mouth daily.   Yes [provider]  ALPRAZolam (XANAX) 0.5 MG tablet Take 0.5 mg by mouth every 8 (eight) hours as needed for anxiety. Patient not taking: Reported on 09/27/2021    [provider]  azithromycin (ZITHROMAX) 250 MG tablet Take 1 tablet (250  mg total) by mouth daily. Take first 2 tablets together, then 1 every day until finished. 11/08/21   Hans Eden, NP  cyclobenzaprine (FLEXERIL) 5 MG tablet Take one tab twice a day, ( may cause drowsiness) for 15 days 07/19/19   [provider]  pantoprazole (PROTONIX) 40 MG tablet Take 40 mg by mouth daily. Patient not taking: Reported on 09/27/2021    [provider]    Family History Family History  Problem Relation Age of Onset   Breast cancer Sister 21    Social History Social  History   Tobacco Use   Smoking status: Never   Smokeless tobacco: Never  Vaping Use   Vaping Use: Never used  Substance Use Topics   Alcohol use: Never   Drug use: Never     Allergies   Patient has no known allergies.   Review of Systems Review of Systems  Constitutional: Negative.   HENT: Negative.    Respiratory:  Positive for cough and shortness of breath. Negative for apnea, choking, chest tightness, wheezing and stridor.   Cardiovascular: Negative.   Skin: Negative.   Neurological:  Positive for headaches. Negative for dizziness, tremors, seizures, syncope, facial asymmetry, speech difficulty, weakness, light-headedness and numbness.     Physical Exam Triage Vital Signs ED Triage Vitals  Enc Vitals Group     BP 11/08/21 1021 133/77     Pulse Rate 11/08/21 1021 78     Resp 11/08/21 1021 18     Temp 11/08/21 1021 98.2 F (36.8 C)     Temp Source 11/08/21 1021 Oral     SpO2 11/08/21 1021 95 %     Weight --      Height --      Head Circumference --      Peak Flow --      Pain Score 11/08/21 1018 0     Pain Loc --      Pain Edu? --      Excl. in [place]? --    No data found.  Updated Vital Signs BP 133/77 (BP Location: Left Arm)   Pulse 78   Temp 98.2 F (36.8 C) (Oral)   Resp 18   SpO2 95%   Visual Acuity Right Eye Distance:   Left Eye Distance:   Bilateral Distance:    Right Eye Near:   Left Eye Near:    Bilateral Near:     Physical Exam Constitutional:      Appearance: Normal appearance. She is well-developed.  HENT:     Head: Normocephalic.     Right Ear: Tympanic membrane, ear canal and external ear normal.     Left Ear: Tympanic membrane, ear canal and external ear normal.     Nose: Nose normal.     Mouth/Throat:     Mouth: Mucous membranes are moist.     Pharynx: Oropharynx is clear.  Eyes:     Extraocular Movements: Extraocular movements intact.  Cardiovascular:     Rate and Rhythm: Normal rate and regular rhythm.     Pulses:  Normal pulses.     Heart sounds: Normal heart sounds.  Pulmonary:     Effort: Pulmonary effort is normal.     Breath sounds: Rhonchi present.     Comments: Harsh congested cough witnessed Musculoskeletal:     Cervical back: Normal range of motion and neck supple.  Skin:    General: Skin is warm and dry.  Neurological:     Mental Status:  She is alert and oriented to person, place, and time. Mental status is at baseline.      UC Treatments / Results  Labs (all labs ordered are listed, but only abnormal results are displayed) Labs Reviewed - No data to display  EKG   Radiology No results found.  Procedures Procedures (including critical care time)  Medications Ordered in UC Medications - No data to display  Initial Impression / Assessment and Plan / UC Course  I have reviewed the triage vital signs and the nursing notes.  Pertinent labs & imaging results that were available during my care of the patient were reviewed by me and considered in my medical decision making (see chart for details).  Community acquired pneumonia  Vital signs are stable and patient is in no signs of distress, O2 saturation 95% on room air, congestion and rhonchi are heard within all lobes, ask patient to forcefully call us with no lung clearing and as symptoms have stayed for 3 to 4 weeks we will begin treatment for pneumonia, unable to complete x-ray imaging today due to staffing, Augmentin 10-day course and a Z-Pak prescribed as well as Tussionex for management of coughing as patient endorses that Tessalon and Promethazine DM were not effective, advised continued use of inhalers as needed and as directed, may continue over-the-counter medications for additional supportive care, may follow-up with urgent care or PCP if symptoms persist or worsen Final Clinical Impressions(s) / UC Diagnoses   Final diagnoses:  Community acquired pneumonia, unspecified laterality     Discharge Instructions       Today you are being treated for pneumonia based on your examination and your timeline of illness, unfortunately I am unable to confirm by x-ray  Take Augmentin every morning and every evening for 10 days  Take azithromycin as directed on packaging  Ideally should begin to see improvement in your symptoms in about 48 hours and steady progression from there  You may use Tussionex every 12 hours to help manage coughing, be mindful this medication may make you drowsy, may use over-the-counter Delsym in addition  You may continue Mucinex and NyQuil if they have been providing you some comfort  May attempt any of the following below:    You can take Tylenol and/or Ibuprofen as needed for fever reduction and pain relief.   For cough: honey 1/2 to 1 teaspoon (you can dilute the honey in water or another fluid).  You can also use guaifenesin and dextromethorphan for cough. You can use a humidifier for chest congestion and cough.  If you don't have a humidifier, you can sit in the bathroom with the hot shower running.      For sore throat: try warm salt water gargles, cepacol lozenges, throat spray, warm tea or water with lemon/honey, popsicles or ice, or OTC cold relief medicine for throat discomfort.   For congestion: take a daily anti-histamine like Zyrtec, Claritin, and a oral decongestant, such as pseudoephedrine.  You can also use Flonase 1-2 sprays in each nostril daily.   It is important to stay hydrated: drink plenty of fluids (water, gatorade/powerade/pedialyte, juices, or teas) to keep your throat moisturized and help further relieve irritation/discomfort.    ED Prescriptions     Medication Sig Dispense Auth. Provider   azithromycin (ZITHROMAX) 250 MG tablet Take 1 tablet (250 mg total) by mouth daily. Take first 2 tablets together, then 1 every day until finished. 6 tablet Lowella Petties R, NP   amoxicillin-clavulanate (AUGMENTIN) 875-125 MG  tablet Take 1 tablet by mouth every 12  (twelve) hours for 10 days. 20 tablet Shane Badeaux, Vincente Liberty R, NP   chlorpheniramine-HYDROcodone (TUSSIONEX PENNKINETIC ER) 10-8 MG/5ML Take 5 mLs by mouth every 12 (twelve) hours as needed for cough. 115 mL Denim Kalmbach, Leitha Schuller, NP      I have reviewed the PDMP during this encounter.   Hans Eden, NP 11/08/21 1045

## 2021-11-08 NOTE — ED Triage Notes (Addendum)
Pt presents with cough, wheezing, chest congestion, and SOB for several weeks. Pt has not seen anyone for her symptoms and has tried OTC medication.

## 2021-11-09 ENCOUNTER — Telehealth: Payer: Self-pay

## 2021-11-09 NOTE — Telephone Encounter (Signed)
Patient's sister called asking for patient to be seen today. I explained to her that dsk will not be in office until next week when her appointment is. She stated patient is having issues and needs to be seen. I suggested she call patient's pcp to see if they can see her-Toni

## 2021-11-11 ENCOUNTER — Other Ambulatory Visit: Payer: Self-pay

## 2021-11-11 ENCOUNTER — Emergency Department: Payer: Medicare HMO

## 2021-11-11 ENCOUNTER — Encounter: Payer: Self-pay | Admitting: Emergency Medicine

## 2021-11-11 ENCOUNTER — Emergency Department
Admission: EM | Admit: 2021-11-11 | Discharge: 2021-11-11 | Disposition: A | Discharge status: Home / Self Care | Payer: Medicare HMO | Attending: Emergency Medicine | Admitting: Emergency Medicine

## 2021-11-11 DIAGNOSIS — R0789 Other chest pain: Secondary | ICD-10-CM | POA: Diagnosis not present

## 2021-11-11 DIAGNOSIS — J9801 Acute bronchospasm: Secondary | ICD-10-CM | POA: Insufficient documentation

## 2021-11-11 DIAGNOSIS — R059 Cough, unspecified: Secondary | ICD-10-CM | POA: Diagnosis not present

## 2021-11-11 DIAGNOSIS — R0781 Pleurodynia: Secondary | ICD-10-CM | POA: Diagnosis not present

## 2021-11-11 LAB — CBC WITH DIFFERENTIAL/PLATELET
Abs Immature Granulocytes: 0.03 10*3/uL (ref 0.00–0.07)
Basophils Absolute: 0.1 10*3/uL (ref 0.0–0.1)
Basophils Relative: 1 %
Eosinophils Absolute: 0.2 10*3/uL (ref 0.0–0.5)
Eosinophils Relative: 3 %
HCT: 37.4 % (ref 36.0–46.0)
Hemoglobin: 12.4 g/dL (ref 12.0–15.0)
Immature Granulocytes: 0 %
Lymphocytes Relative: 21 %
Lymphs Abs: 1.6 10*3/uL (ref 0.7–4.0)
MCH: 30.2 pg (ref 26.0–34.0)
MCHC: 33.2 g/dL (ref 30.0–36.0)
MCV: 91.2 fL (ref 80.0–100.0)
Monocytes Absolute: 0.6 10*3/uL (ref 0.1–1.0)
Monocytes Relative: 8 %
Neutro Abs: 4.8 10*3/uL (ref 1.7–7.7)
Neutrophils Relative %: 67 %
Platelets: 206 10*3/uL (ref 150–400)
RBC: 4.1 MIL/uL (ref 3.87–5.11)
RDW: 14 % (ref 11.5–15.5)
WBC: 7.3 10*3/uL (ref 4.0–10.5)
nRBC: 0 % (ref 0.0–0.2)

## 2021-11-11 LAB — BASIC METABOLIC PANEL
Anion gap: 5 (ref 5–15)
BUN: 15 mg/dL (ref 8–23)
CO2: 27 mmol/L (ref 22–32)
Calcium: 9.1 mg/dL (ref 8.9–10.3)
Chloride: 105 mmol/L (ref 98–111)
Creatinine, Ser: 0.65 mg/dL (ref 0.44–1.00)
GFR, Estimated: >60 mL/min (ref >60)
Glucose, Bld: 128 mg/dL — ABNORMAL HIGH (ref 70–99)
Potassium: 3.9 mmol/L (ref 3.5–5.1)
Sodium: 137 mmol/L (ref 135–145)

## 2021-11-11 MED ORDER — LIDOCAINE 5 % EX PTCH
1.0000 | MEDICATED_PATCH | CUTANEOUS | Status: DC
  Administered 2021-11-11: 1 via TRANSDERMAL
  Filled 2021-11-11: qty 1

## 2021-11-11 MED ORDER — LIDOCAINE 5 % EX PTCH: 1.0000 | MEDICATED_PATCH | Freq: Two times a day (BID) | CUTANEOUS | 0 refills | Status: DC

## 2021-11-11 MED ORDER — PREDNISONE 20 MG PO TABS
60.0000 mg | ORAL_TABLET | Freq: Once | ORAL | Status: AC
  Administered 2021-11-11: 60 mg via ORAL
  Filled 2021-11-11: qty 3

## 2021-11-11 MED ORDER — ALBUTEROL SULFATE HFA 108 (90 BASE) MCG/ACT IN AERS: 2.0000 | INHALATION_SPRAY | Freq: Four times a day (QID) | RESPIRATORY_TRACT | 0 refills | Status: DC | PRN

## 2021-11-11 MED ORDER — PREDNISONE 20 MG PO TABS: 60.0000 mg | ORAL_TABLET | Freq: Every day | ORAL | 0 refills | Status: AC

## 2021-11-11 MED ORDER — IPRATROPIUM-ALBUTEROL 0.5-2.5 (3) MG/3ML IN SOLN
9.0000 mL | Freq: Once | RESPIRATORY_TRACT | Status: AC
  Administered 2021-11-11: 9 mL via RESPIRATORY_TRACT
  Filled 2021-11-11: qty 9

## 2021-11-11 NOTE — ED Provider Notes (Signed)
[facility] Provider Note    Event Date/Time   First MD Initiated Contact with Patient 11/11/21 1120     (approximate)   History   Chief Complaint Fall and Cough   HPI  Kim Bentley is a 81 y.o. female with past medical history of hyperlipidemia and GERD who presents to the ED complaining of chest wall pain and cough.  Patient reports that she fell 4 days ago after tripping on a mat at her grandson's summer camp.  She states that she hit her upper abdomen and right chest wall, denies hitting her head or losing consciousness.  She has been dealing with soreness over her right chest since then.  She also states that she has been dealing with a cough productive of whitish sputum over the past 2 weeks.  It has been slowly getting worse and she was seen at urgent care for this 2 days ago, when she was prescribed Augmentin and azithromycin.  She denies any fevers or shortness of breath, does feel like she has been wheezing.  She has an albuterol inhaler that she uses at home with partial relief, denies any history of asthma or COPD but is scheduled to see a pulmonologist next week for this.     Physical Exam   Triage Vital Signs: ED Triage Vitals  Enc Vitals Group     BP 11/11/21 0759 135/79     Pulse Rate 11/11/21 0759 76     Resp 11/11/21 0759 18     Temp 11/11/21 0759 98.6 F (37 C)     Temp Source 11/11/21 0759 Oral     SpO2 11/11/21 0759 96 %     Weight 11/11/21 0801 175 lb (79.4 kg)     Height 11/11/21 0801 '5\' 1"'$  (1.549 m)     Head Circumference --      Peak Flow --      Pain Score 11/11/21 0801 7     Pain Loc --      Pain Edu? --      Excl. in [place]? --     Most recent vital signs: Vitals:   11/11/21 1118 11/11/21 1259  BP: 140/80 138/78  Pulse: 80 88  Resp: 18 18  Temp: 98 F (36.7 C)   SpO2: 96% 96%    Constitutional: Alert and oriented. Eyes: Conjunctivae are normal. Head: Atraumatic. Nose: No congestion/rhinnorhea. Mouth/Throat:  Mucous membranes are moist.  Cardiovascular: Normal rate, regular rhythm. Grossly normal heart sounds.  2+ radial pulses bilaterally. Respiratory: Normal respiratory effort.  No retractions. Lungs with expiratory wheezing throughout.  Right chest wall tenderness to palpation noted. Gastrointestinal: Soft and nontender. No distention. Musculoskeletal: No lower extremity tenderness nor edema.  Neurologic:  Normal speech and language. No gross focal neurologic deficits are appreciated.    ED Results / Procedures / Treatments   Labs (all labs ordered are listed, but only abnormal results are displayed) Labs Reviewed  BASIC METABOLIC PANEL - Abnormal; Notable for the following components:      Result Value   Glucose, Bld 128 (*)    All other components within normal limits  CBC WITH DIFFERENTIAL/PLATELET     EKG  ED ECG REPORT I, Blake Divine, the attending physician, personally viewed and interpreted this ECG.   Date: 11/11/2021  EKG Time: 8:08  Rate: 72  Rhythm: normal sinus rhythm  Axis: Normal  Intervals:none  ST&T Change: None  RADIOLOGY Chest x-ray reviewed and interpreted by me with no  infiltrate, edema, or effusion.  PROCEDURES:  Critical Care performed: No  Procedures   MEDICATIONS ORDERED IN ED: Medications  lidocaine (LIDODERM) 5 % 1 patch (1 patch Transdermal Patch Applied 11/11/21 1258)  predniSONE (DELTASONE) tablet 60 mg (60 mg Oral Given 11/11/21 1245)  ipratropium-albuterol (DUONEB) 0.5-2.5 (3) MG/3ML nebulizer solution 9 mL (9 mLs Nebulization Given 11/11/21 1245)     IMPRESSION / MDM / ASSESSMENT AND PLAN / ED COURSE  I reviewed the triage vital signs and the nursing notes.                              81 y.o. female with past medical history of hyperlipidemia and GERD who presents to the ED complaining of productive cough for the past 2 weeks, now with some pain in her right chest wall after falling earlier this week.  Patient's presentation  is most consistent with acute presentation with potential threat to life or bodily function.  Differential diagnosis includes, but is not limited to, ACS, PE, pneumonia, pleural effusion, CHF, bronchospasm, rib fracture, pneumothorax, hemothorax, bronchitis.  Patient well-appearing and in no acute distress, vital signs are unremarkable and EKG shows no evidence of arrhythmia or ischemia.  She is breathing comfortably on room air with O2 sats of 96%, but does have significant wheezing.  This is likely contributing to her worsening cough over the past 2 weeks, chest x-ray shows no signs of pneumonia, rib fracture, or other acute process.  We will treat with steroids and DuoNebs.  Her chest pain is reproducible with palpation along the right side and suspect pain is due to chest wall contusion, no findings to suggest ACS, PE, or dissection.  Labs are reassuring with no significant anemia, leukocytosis, electrolyte abnormality, or AKI.  Patient reports feeling much better following breathing treatments, steroids, and Lidoderm patch.  Wheezing almost completely resolved on reassessment and she continues to breathe comfortably on room air.  Chest wall pain also improved following Lidoderm patch.  She is appropriate for discharge home with PCP follow-up, also states she has an appointment with pulmonologist early next week.  She was prescribed course of steroids as well as refill for albuterol, was counseled to return to the ED for new or worsening symptoms.  Patient agrees with plan.      FINAL CLINICAL IMPRESSION(S) / ED DIAGNOSES   Final diagnoses:  Bronchospasm  Chest wall pain     Rx / DC Orders   ED Discharge Orders          Ordered    albuterol (VENTOLIN HFA) 108 (90 Base) MCG/ACT inhaler  Every 6 hours PRN       Note to Pharmacy: Please supply with spacer   11/11/21 1436    predniSONE (DELTASONE) 20 MG tablet  Daily with breakfast        11/11/21 1436    lidocaine (LIDODERM) 5 %  Every  12 hours        11/11/21 1436             Note:  This document was prepared using Dragon voice recognition software and may include unintentional dictation errors.   Blake Divine, MD 11/11/21 [phone]

## 2021-11-11 NOTE — ED Notes (Signed)
See triage note  Presents with pain to right lateral rib area  States she fell on Monday  Was seen by her PCP and dx'd pne  Placed on 2 antibiotics  But is having increased pain with movement or breathing

## 2021-11-11 NOTE — ED Triage Notes (Signed)
Patient arrives ambulatory by POV c/o cough and congestion ongoing for about 3 weeks. Was seen at [facility] and started on antibiotics. States she fell on the 17th- her foot got tripped while trying to step up. Patient c/o right sided rib cage and radiating around her back since fall. States diarrhea started yesterday.

## 2021-11-16 ENCOUNTER — Ambulatory Visit: Payer: Medicare HMO | Admitting: Internal Medicine

## 2021-11-16 ENCOUNTER — Encounter: Payer: Self-pay | Admitting: Internal Medicine

## 2021-11-16 VITALS — BP 140/62 | HR 84 | Temp 98.2°F | Resp 16 | Ht 61.0 in | Wt 175.0 lb

## 2021-11-16 DIAGNOSIS — R0789 Other chest pain: Secondary | ICD-10-CM | POA: Diagnosis not present

## 2021-11-16 DIAGNOSIS — M0579 Rheumatoid arthritis with rheumatoid factor of multiple sites without organ or systems involvement: Secondary | ICD-10-CM

## 2021-11-16 DIAGNOSIS — R0602 Shortness of breath: Secondary | ICD-10-CM | POA: Diagnosis not present

## 2021-11-16 DIAGNOSIS — J849 Interstitial pulmonary disease, unspecified: Secondary | ICD-10-CM | POA: Diagnosis not present

## 2021-11-16 NOTE — Patient Instructions (Signed)
Pulmonary Fibrosis  Pulmonary fibrosis is a type of lung disease that causes scarring. Over time, the scar tissue builds up in the air sacs of your lungs (alveoli). This makes it hard for you to breathe because less oxygen gets into your bloodstream. Scarring from pulmonary fibrosis is permanent and may lead to other serious health problems. What are the causes? There are many different causes of pulmonary fibrosis. In some cases, the cause is not known. This is called idiopathic pulmonary fibrosis. Other causes include: Exposure to chemicals and substances found in agricultural, farm, construction, or factory work. These include mold, asbestos, silica, metal dusts, and toxic fumes. Sarcoidosis. In this disease, areas of inflammatory cells (granulomas) form and most often affect the lungs. Autoimmune diseases. These include diseases such as rheumatoid arthritis, systemic sclerosis, or connective tissue disease. Taking certain medicines. These include drugs used in radiation therapy or used to treat seizures, heart problems, and some infections. What increases the risk? You are more likely to develop this condition if: You have a family history of the disease. You are an older person. The condition is more common in older adults. You have a history of smoking. You have a job that exposes you to certain chemicals. You have gastroesophageal reflux disease (GERD). What are the signs or symptoms? Symptoms of this condition include: Difficulty breathing that gets worse with activity. Shortness of breath (dyspnea). Dry, hacking cough. Rapid, shallow breathing during exercise or while at rest. Other symptoms may include: Loss of appetite or weight loss Tiredness (fatigue) or weakness. Bluish skin and lips. Rounded and enlarged fingertips (clubbing). How is this diagnosed? This condition may be diagnosed based on: Your symptoms and medical history. A physical exam. You may also have tests,  including: A test that involves looking inside your lungs with an instrument (bronchoscopy). Imaging studies of your lungs and heart. Tests to measure how well you are breathing (pulmonary function tests). Blood tests. Tests to see how well your lungs work while you are walking (pulmonary stress test). A procedure to remove a lung tissue sample to look at it under a microscope (biopsy). How is this treated? There is no cure for pulmonary fibrosis. Treatment focuses on managing symptoms and preventing scarring from getting worse. This may include: Medicines, such as: Steroids to prevent permanent lung changes. Medicines to suppress your body's defense system (immune system). Medicines to help with lung function by reducing inflammation or scarring. Ongoing monitoring with X-rays and lab work. Oxygen therapy. Pulmonary rehabilitation. Surgery. In some cases, a lung transplant is possible. Follow these instructions at home:  Medicines Take over-the-counter and prescription medicines only as told by your health care provider. Keep your vaccinations up to date as recommended by your health care provider. Activity Get regular exercise, but do not pick activities that are too strenuous for you. Ask your health care provider what activities are safe for you. If you have physical limitations, you may get exercise by walking, using a stationary bike, or doing chair exercises. Ask your health care provider about using oxygen while exercising. Do breathing exercises as told by your health care provider. Plan rest periods when you get tired. General instructions Do not use any products that contain nicotine or tobacco. These products include cigarettes, chewing tobacco, and vaping devices, such as e-cigarettes. If you need help quitting, ask your health care provider. If you are exposed to chemicals and substances at work, make sure that you wear a mask or respirator at all times. Learn to   manage  stress. If you need help to do this, ask your health care provider. Join a pulmonary rehabilitation program or a support group for people with pulmonary fibrosis. Eat small meals often so you do not get too full. Overeating can make breathing trouble worse. Maintain a healthy weight. Lose weight if you need to. Keep all follow-up visits. This is important. Where to find more information American Lung Association: www.lung.org National Heart, Lung, and Blood Institute: www.nhlbi.nih.gov Pulmonary Fibrosis Foundation: pulmonaryfibrosis.org Contact a health care provider if: You have symptoms that do not get better with medicines. You are not able to be as active as usual. You have trouble taking a deep breath. You have a fever or chills. You have blue lips or skin. You have a lot of headaches. You cough up mucus that is dark in color. You have feelings of depression or sadness. You are unable to sleep because it is hard to breathe. Get help right away if: Your symptoms suddenly worsen. You have chest pain. You cough up blood. You get very confused or sleepy. These symptoms may be an emergency. Get help right away. Call 911. Do not wait to see if the symptoms will go away. Do not drive yourself to the hospital. Summary Pulmonary fibrosis is a type of lung disease that causes scar tissue to build up in the air sacs of your lungs (alveoli) over time. This makes it hard for you to breathe because less oxygen gets into your bloodstream. Scarring from pulmonary fibrosis is permanent and may lead to other serious health problems. You are more likely to develop this condition if you have a family history of the condition or a job that exposes you to certain chemicals. There is no cure for pulmonary fibrosis. Treatment focuses on managing symptoms and preventing scarring from getting worse. This information is not intended to replace advice given to you by your health care provider. Make sure  you discuss any questions you have with your health care provider. Document Revised: 12/01/2020 Document Reviewed: 12/01/2020 Elsevier Patient Education  2023 Elsevier Inc.  

## 2021-11-16 NOTE — Progress Notes (Signed)
[facility] [place],  [address]  Pulmonary Sleep Medicine   Office Visit Note  Patient Name: Kim Bentley DOB: 31-May-1940 MRN [id number]  Date of Service: 11/16/2021  Complaints/HPI: She states that she apparently tripped and fell and hit her chest. She has been having trouble with her breathing. She was given steroids and also was on albuterol. This occurred a week ago. She states that she is better but not quite back to baseline. She also has RA for a while. She sees rheum for that. She has been on Methtrexate meloxicam. She has noted some SOB no recent PFT done for DLCO. She has never smoked. 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 No family history that is pertinent. She has no history of asthma. She has gained weight. She has not had a recent CT and no recent PFT  ROS  General: (-) fever, (-) chills, (-) night sweats, (-) weakness Skin: (-) rashes, (-) itching,. Eyes: (-) visual changes, (-) redness, (-) itching. Nose and Sinuses: (-) nasal stuffiness or itchiness, (-) postnasal drip, (-) nosebleeds, (-) sinus trouble. Mouth and Throat: (-) sore throat, (-) hoarseness. Neck: (-) swollen glands, (-) enlarged thyroid, (-) neck pain. Respiratory: - cough, (-) bloody sputum, + shortness of breath, - wheezing. Cardiovascular: - ankle swelling, (-) chest pain. Lymphatic: (-) lymph node enlargement. Neurologic: (-) numbness, (-) tingling. Psychiatric: (-) anxiety, (-) depression   Current Medication: Outpatient Encounter Medications as of 11/16/2021  Medication Sig   albuterol (VENTOLIN HFA) 108 (90 Base) MCG/ACT inhaler Inhale 2 puffs into the lungs every 6 (six) hours as needed for wheezing or shortness of breath.   ALPRAZolam (XANAX) 0.5 MG tablet Take 0.5 mg by mouth every 8 (eight) hours as needed for anxiety. (Patient not taking: Reported on 09/27/2021)   amoxicillin-clavulanate (AUGMENTIN) 875-125 MG tablet Take 1 tablet by mouth every 12 (twelve)  hours for 10 days.   aspirin 81 MG EC tablet Take by mouth.   azithromycin (ZITHROMAX) 250 MG tablet Take 1 tablet (250 mg total) by mouth daily. Take first 2 tablets together, then 1 every day until finished.   calcium-vitamin D (OSCAL WITH D) 500-200 MG-UNIT tablet Take 1 tablet by mouth 2 (two) times daily.   chlorpheniramine-HYDROcodone (TUSSIONEX PENNKINETIC ER) 10-8 MG/5ML Take 5 mLs by mouth every 12 (twelve) hours as needed for cough.   Cyanocobalamin 1000 MCG TBCR Take by mouth.   cyclobenzaprine (FLEXERIL) 5 MG tablet Take one tab twice a day, ( may cause drowsiness) for 15 days   fluticasone-salmeterol (ADVAIR) 250-50 MCG/ACT AEPB Inhale into the lungs.   folic acid (FOLVITE) 1 MG tablet Take by mouth.   hydroxychloroquine (PLAQUENIL) 200 MG tablet Take 1 tablet by mouth 2 (two) times daily.   lidocaine (LIDODERM) 5 % Place 1 patch onto the skin every 12 (twelve) hours. Remove & Discard patch within 12 hours or as directed by MD   meloxicam (MOBIC) 7.5 MG tablet Take by mouth.   methotrexate (RHEUMATREX) 2.5 MG tablet Take by mouth.   OMEGA-3 FATTY ACIDS PO Take by mouth.   pantoprazole (PROTONIX) 40 MG tablet Take 40 mg by mouth daily. (Patient not taking: Reported on 09/27/2021)   predniSONE (DELTASONE) 20 MG tablet Take 3 tablets (60 mg total) by mouth daily with breakfast for 5 days.   vitamin C (ASCORBIC ACID) 500 MG tablet Take 500 mg by mouth daily.   No facility-administered encounter medications on file as of 11/16/2021.  Surgical History: Past Surgical History:  Procedure Laterality Date   COLONOSCOPY     COLONOSCOPY N/A 09/27/2021   Procedure: COLONOSCOPY;  Surgeon: Lesly Rubenstein, MD;  Location: [facility] ENDOSCOPY;  Service: Endoscopy;  Laterality: N/A;   COLONOSCOPY WITH PROPOFOL N/A 03/26/2018   Procedure: COLONOSCOPY WITH PROPOFOL;  Surgeon: Lollie Sails, MD;  Location: [facility] ENDOSCOPY;  Service: Endoscopy;  Laterality: N/A;   ESOPHAGOGASTRODUODENOSCOPY      HERNIA REPAIR     TONSILLECTOMY      Medical History: Past Medical History:  Diagnosis Date   Allergic genetic state    Arthritis    Bladder spasms    GERD (gastroesophageal reflux disease)    HLD (hyperlipidemia)     Family History: Family History  Problem Relation Age of Onset   Breast cancer Sister 94    Social History: Social History   Socioeconomic History   Marital status: Married    Spouse name: Not on file   Number of children: Not on file   Years of education: Not on file   Highest education level: Not on file  Occupational History   Not on file  Tobacco Use   Smoking status: Never   Smokeless tobacco: Never  Vaping Use   Vaping Use: Never used  Substance and Sexual Activity   Alcohol use: Never   Drug use: Never   Sexual activity: Not on file  Other Topics Concern   Not on file  Social History Narrative   Not on file   Social Determinants of Health   Financial Resource Strain: Not on file  Food Insecurity: Not on file  Transportation Needs: Not on file  Physical Activity: Not on file  Stress: Not on file  Social Connections: Not on file  Intimate Partner Violence: Not on file    Vital Signs: Resp. rate 16, height '5\' 1"'$  (1.549 m), weight 175 lb (79.4 kg).  Examination: General Appearance: The patient is well-developed, well-nourished, and in no distress. Skin: Gross inspection of skin unremarkable. Head: normocephalic, no gross deformities. Eyes: no gross deformities noted. ENT: ears appear grossly normal no exudates. Neck: Supple. No thyromegaly. No LAD. Respiratory: no rhonchi noted at this time. Cardiovascular: Normal S1 and S2 without murmur or rub. Extremities: No cyanosis. pulses are equal. Neurologic: Alert and oriented. No involuntary movements.  LABS: Recent Results (from the past 2160 hour(s))  POCT rapid strep A     Status: Normal   Collection Time: 08/24/21  7:30 PM  Result Value Ref Range   Rapid Strep A Screen Negative  Negative  Surgical pathology     Status: None   Collection Time: 09/27/21 10:30 AM  Result Value Ref Range   SURGICAL PATHOLOGY      SURGICAL PATHOLOGY CASE: [id number] PATIENT: Kim Bentley Surgical Pathology Report     Specimen Submitted: A. Colon polyp x2, ascending; cbx (1) c. snare (1) B. Colon polyp x2, transverse; cbx (1) c. snare (1) C. 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 Colon polyps, diverticulosis, hemorrhoids     DIAGNOSIS: A. COLON POLYP X 2, ASCENDING; COLD BIOPSY (1) AND COLD SNARE (1): - TUBULAR ADENOMAS, MULTIPLE FRAGMENTS. - NEGATIVE FOR HIGH-GRADE DYSPLASIA AND MALIGNANCY.  B.  COLON POLYP X 2, TRANSVERSE; COLD BIOPSY (1) AND COLD SNARE (1): - TUBULAR ADENOMAS, 3 FRAGMENTS. - NEGATIVE FOR HIGH-GRADE DYSPLASIA AND MALIGNANCY.  C.  COLON POLYP X 2, DESCENDING; COLD SNARE: - TUBULAR ADENOMA, NEGATIVE FOR HIGH-GRADE  DYSPLASIA AND MALIGNANCY. - HYPERPLASTIC POLYP, NEGATIVE FOR DYSPLASIA AND MALIGNANCY.  GROSS DESCRIPTION: A. Labeled: Ascending colon polyps x2, 1-cbx, 1-cold snare Received: Formalin Coll ection time: 10:30 AM on 09/27/2021 Placed into formalin time: 10:30 AM on 09/27/2021 Tissue fragment(s): Multiple Size: Aggregate, 1.4 x 1.2 x 0.3 cm Description: Received are fragments of tan soft tissue admixed with intestinal debris.  The ratio of soft tissue to intestinal debris is 90: 10.  The largest soft tissue fragment has a resection margin which is inked green.  This fragment is serially sectioned. Entirely submitted in cassettes 1-2 with the serially sectioned fragment in cassette 1 and the remaining fragments in cassette 2.  B. Labeled: Transverse colon polyps x2, 1-cold snare, 1-cbx Received: Formalin Collection time: 11:14 AM on 09/27/2021 Placed into formalin time: 11:14 AM on 09/27/2021 Tissue fragment(s): 3 Size: Aggregate, 1 x 0.4 x 0.2 cm Description: Tan soft tissue  fragments Entirely submitted in 1 cassette.  C. Labeled: Cold snare descending colon polyp x2 Received: Formalin Collection time: 11:18 AM on 09/27/2021 Placed into formalin time: 11:19 AM  on 09/27/2021 Tissue fragment(s): 2 Size: Range from 0.9 to 1 cm Description: Received are 2 fragments of tan-pink soft tissue.  The resection margins are differentially inked and the fragments are trisected. Entirely submitted in 1 cassette.  RB 09/27/2021  Final Diagnosis performed by Bryan Lemma, MD.   Electronically signed 09/29/2021 4:01:10PM The electronic signature indicates that the named Attending Pathologist has evaluated the specimen Technical component performed at [facility], [address] [place], [place] [address] Lab: [phone] Dir: Rush Farmer, MD, MMM  Professional component performed at [facility], [facility], [place], [place], [place] [address] Lab: [phone] Dir: Kathi Simpers, MD   CBC with Differential     Status: None   Collection Time: 11/11/21  8:08 AM  Result Value Ref Range   WBC 7.3 4.0 - 10.5 K/uL   RBC 4.10 3.87 - 5.11 MIL/uL   Hemoglobin 12.4 12.0 - 15.0 g/dL   HCT 37.4 36.0 - 46.0 %   MCV 91.2 80.0 - 100.0 fL   MCH 30.2 26.0 - 34.0 pg   MCHC 33.2 30.0 - 36.0 g/dL   RDW 14.0 11.5 - 15.5 %   Platelets 206 150 - 400 K/uL   nRBC 0.0 0.0 - 0.2 %   Neutrophils Relative % 67 %   Neutro Abs 4.8 1.7 - 7.7 K/uL   Lymphocytes Relative 21 %   Lymphs Abs 1.6 0.7 - 4.0 K/uL   Monocytes Relative 8 %   Monocytes Absolute 0.6 0.1 - 1.0 K/uL   Eosinophils Relative 3 %   Eosinophils Absolute 0.2 0.0 - 0.5 K/uL   Basophils Relative 1 %   Basophils Absolute 0.1 0.0 - 0.1 K/uL   Immature Granulocytes 0 %   Abs Immature Granulocytes 0.03 0.00 - 0.07 K/uL    Comment: Performed at [facility], [address], [place], [place] [address]  Basic metabolic panel     Status: Abnormal   Collection Time: 11/11/21  8:08 AM  Result Value Ref  Range   Sodium 137 135 - 145 mmol/L   Potassium 3.9 3.5 - 5.1 mmol/L   Chloride 105 98 - 111 mmol/L   CO2 27 22 - 32 mmol/L   Glucose, Bld 128 (H) 70 - 99 mg/dL    Comment: Glucose reference range applies only to samples taken after fasting for at least 8 hours.   BUN 15 8 -  23 mg/dL   Creatinine, Ser 0.65 0.44 - 1.00 mg/dL   Calcium 9.1 8.9 - 10.3 mg/dL   GFR, Estimated >60 >60 mL/min    Comment: (NOTE) Calculated using the CKD-EPI Creatinine Equation (2021)    Anion gap 5 5 - 15    Comment: Performed at [facility], [address] [place], [place] [address]    Radiology: DG Chest 2 View  Result Date: 11/11/2021 CLINICAL DATA:  Cough.  Rib pain after fall. EXAM: CHEST - 2 VIEW COMPARISON:  October 26, 2011. FINDINGS: Stable cardiomediastinal silhouette. Both lungs are clear. The visualized skeletal structures are unremarkable. IMPRESSION: No active cardiopulmonary disease. Electronically Signed   By: Marijo Conception M.D.   On: 11/11/2021 08:28    No results found.  DG Chest 2 View  Result Date: 11/11/2021 CLINICAL DATA:  Cough.  Rib pain after fall. EXAM: CHEST - 2 VIEW COMPARISON:  October 26, 2011. FINDINGS: Stable cardiomediastinal silhouette. Both lungs are clear. The visualized skeletal structures are unremarkable. IMPRESSION: No active cardiopulmonary disease. Electronically Signed   By: Marijo Conception M.D.   On: 11/11/2021 08:28      Assessment and Plan: Patient Active Problem List   Diagnosis Date Noted   Shortness of breath 11/16/2021   Rheumatoid arthritis involving multiple sites with positive rheumatoid factor ([place]) 11/16/2021   Other chest pain 11/16/2021   ILD (interstitial lung disease) ([place]) 11/16/2021   1. Shortness of breath Likely from the fall and splinting. Still she does have RA and need to evaluate for any pulm involvement - Pulmonary function test; Future  2. Rheumatoid arthritis involving multiple sites with positive rheumatoid factor  (HCC) Has on methotrexate will get PFT to assess DLCO Lung volumes  3. Other chest pain From fal does not appear to be cardiac related  4. ILD (interstitial lung disease) ([place]) PFT and high res CT ordered - CT Chest High Resolution; Future    General Counseling: I have discussed the findings of the evaluation and examination with [facility].  I have also discussed any further diagnostic evaluation thatmay be needed or ordered today. Keajah verbalizes understanding of the findings of todays visit. We also reviewed her medications today and discussed drug interactions and side effects including but not limited excessive drowsiness and altered mental states. We also discussed that there is always a risk not just to her but also people around her. she has been encouraged to call the office with any questions or concerns that should arise related to todays visit.  No orders of the defined types were placed in this encounter.    Time spent: 46  I have personally obtained a history, examined the patient, evaluated laboratory and imaging results, formulated the assessment and plan and placed orders.    Allyne Gee, MD [facility] Pulmonary and Critical Care Sleep medicine

## 2021-11-22 ENCOUNTER — Ambulatory Visit
Admission: RE | Admit: 2021-11-22 | Discharge: 2021-11-22 | Disposition: A | Discharge status: Home / Self Care | Payer: Medicare HMO | Source: Ambulatory Visit | Attending: Internal Medicine | Admitting: Internal Medicine

## 2021-11-22 DIAGNOSIS — R918 Other nonspecific abnormal finding of lung field: Secondary | ICD-10-CM | POA: Diagnosis not present

## 2021-11-22 DIAGNOSIS — I2510 Atherosclerotic heart disease of native coronary artery without angina pectoris: Secondary | ICD-10-CM | POA: Diagnosis not present

## 2021-11-22 DIAGNOSIS — I358 Other nonrheumatic aortic valve disorders: Secondary | ICD-10-CM | POA: Diagnosis not present

## 2021-11-22 DIAGNOSIS — J849 Interstitial pulmonary disease, unspecified: Secondary | ICD-10-CM | POA: Diagnosis not present

## 2021-11-23 ENCOUNTER — Telehealth: Payer: Self-pay

## 2021-11-23 NOTE — Telephone Encounter (Signed)
Pt called asking for CT results and I advised pt that I would talk with DR Laurelyn Sickle and let him know she was requesting the results.  I notified Dr Humphrey Rolls and advised pt was asking about results.

## 2021-11-29 ENCOUNTER — Encounter: Payer: Self-pay | Admitting: Internal Medicine

## 2021-11-29 ENCOUNTER — Ambulatory Visit: Payer: Medicare HMO | Admitting: Internal Medicine

## 2021-11-29 VITALS — BP 140/78 | HR 93 | Temp 98.1°F | Resp 16 | Ht 61.0 in | Wt 173.2 lb

## 2021-11-29 DIAGNOSIS — R918 Other nonspecific abnormal finding of lung field: Secondary | ICD-10-CM | POA: Diagnosis not present

## 2021-11-29 DIAGNOSIS — M0579 Rheumatoid arthritis with rheumatoid factor of multiple sites without organ or systems involvement: Secondary | ICD-10-CM

## 2021-11-29 DIAGNOSIS — K219 Gastro-esophageal reflux disease without esophagitis: Secondary | ICD-10-CM

## 2021-11-29 NOTE — Progress Notes (Signed)
[facility] [place], [place] [address]  Pulmonary Sleep Medicine   Office Visit Note  Patient Name: Kim Bentley DOB: [birth year] MRN [id number]  Date of Service: 11/29/2021  Complaints/HPI: Pulmonary Mass she had a mass noted on the CT scan we discussed the findings and the possibilities.  I think she needs to have a repeat evaluation with a PET scan to be done to see if there is any increased uptake in the area of concern.  She denies having any cough and she denies having any hemoptysis.  Occasional shortness of breath is noted.  ROS  General: (-) fever, (-) chills, (-) night sweats, (-) weakness Skin: (-) rashes, (-) itching,. Eyes: (-) visual changes, (-) redness, (-) itching. Nose and Sinuses: (-) nasal stuffiness or itchiness, (-) postnasal drip, (-) nosebleeds, (-) sinus trouble. Mouth and Throat: (-) sore throat, (-) hoarseness. Neck: (-) swollen glands, (-) enlarged thyroid, (-) neck pain. Respiratory: - cough, (-) bloody sputum, + shortness of breath, + wheezing. Cardiovascular: - ankle swelling, (-) chest pain. Lymphatic: (-) lymph node enlargement. Neurologic: (-) numbness, (-) tingling. Psychiatric: (-) anxiety, (-) depression   Current Medication: Outpatient Encounter Medications as of 11/29/2021  Medication Sig   albuterol (VENTOLIN HFA) 108 (90 Base) MCG/ACT inhaler Inhale 2 puffs into the lungs every 6 (six) hours as needed for wheezing or shortness of breath.   ALPRAZolam (XANAX) 0.5 MG tablet Take 0.5 mg by mouth every 8 (eight) hours as needed for anxiety.   aspirin 81 MG EC tablet Take by mouth.   calcium-vitamin D (OSCAL WITH D) 500-200 MG-UNIT tablet Take 1 tablet by mouth 2 (two) times daily.   Cyanocobalamin 1000 MCG TBCR Take by mouth.   cyclobenzaprine (FLEXERIL) 5 MG tablet Take one tab twice a day, ( may cause drowsiness) for 15 days   fluticasone-salmeterol (ADVAIR) 250-50 MCG/ACT AEPB Inhale into the lungs.   folic acid  (FOLVITE) 1 MG tablet Take by mouth.   hydroxychloroquine (PLAQUENIL) 200 MG tablet Take 1 tablet by mouth 2 (two) times daily.   lidocaine (LIDODERM) 5 % Place 1 patch onto the skin every 12 (twelve) hours. Remove & Discard patch within 12 hours or as directed by MD   meloxicam (MOBIC) 7.5 MG tablet Take by mouth.   methotrexate (RHEUMATREX) 2.5 MG tablet Take by mouth.   OMEGA-3 FATTY ACIDS PO Take by mouth.   pantoprazole (PROTONIX) 40 MG tablet Take 40 mg by mouth daily.   vitamin C (ASCORBIC ACID) 500 MG tablet Take 500 mg by mouth daily.   [DISCONTINUED] chlorpheniramine-HYDROcodone (TUSSIONEX PENNKINETIC ER) 10-8 MG/5ML Take 5 mLs by mouth every 12 (twelve) hours as needed for cough. (Patient not taking: Reported on 11/29/2021)   No facility-administered encounter medications on file as of 11/29/2021.    Surgical History: Past Surgical History:  Procedure Laterality Date   COLONOSCOPY     COLONOSCOPY N/A 09/27/2021   Procedure: COLONOSCOPY;  Surgeon: Lesly Rubenstein, MD;  Location: [facility] ENDOSCOPY;  Service: Endoscopy;  Laterality: N/A;   COLONOSCOPY WITH PROPOFOL N/A 03/26/2018   Procedure: COLONOSCOPY WITH PROPOFOL;  Surgeon: Lollie Sails, MD;  Location: [facility] ENDOSCOPY;  Service: Endoscopy;  Laterality: N/A;   ESOPHAGOGASTRODUODENOSCOPY     HERNIA REPAIR     TONSILLECTOMY      Medical History: Past Medical History:  Diagnosis Date   Allergic genetic state    Arthritis    Bladder spasms    GERD (gastroesophageal reflux disease)  HLD (hyperlipidemia)     Family History: Family History  Problem Relation Age of Onset   Breast cancer Sister 67    Social History: Social History   Socioeconomic History   Marital status: Married    Spouse name: Not on file   Number of children: Not on file   Years of education: Not on file   Highest education level: Not on file  Occupational History   Not on file  Tobacco Use   Smoking status: Never   Smokeless tobacco:  Never  Vaping Use   Vaping Use: Never used  Substance and Sexual Activity   Alcohol use: Never   Drug use: Never   Sexual activity: Not on file  Other Topics Concern   Not on file  Social History Narrative   Not on file   Social Determinants of Health   Financial Resource Strain: Not on file  Food Insecurity: Not on file  Transportation Needs: Not on file  Physical Activity: Not on file  Stress: Not on file  Social Connections: Not on file  Intimate Partner Violence: Not on file    Vital Signs: Blood pressure (!) 140/78, pulse 93, temperature 98.1 F (36.7 C), resp. rate 16, height '5\' 1"'$  (1.549 m), weight 173 lb 3.2 oz (78.6 kg), SpO2 94 %.  Examination: General Appearance: The patient is well-developed, well-nourished, and in no distress. Skin: Gross inspection of skin unremarkable. Head: normocephalic, no gross deformities. Eyes: no gross deformities noted. ENT: ears appear grossly normal no exudates. Neck: Supple. No thyromegaly. No LAD. Respiratory: no rhonchi. Cardiovascular: Normal S1 and S2 without murmur or rub. Extremities: No cyanosis. pulses are equal. Neurologic: Alert and oriented. No involuntary movements.  LABS: Recent Results (from the past 2160 hour(s))  Surgical pathology     Status: None   Collection Time: 09/27/21 10:30 AM  Result Value Ref Range   SURGICAL PATHOLOGY      SURGICAL PATHOLOGY CASE: [id number] PATIENT: [name] Surgical Pathology Report     Specimen Submitted: A. Colon polyp x2, ascending; cbx (1) c. snare (1) B. Colon polyp x2, transverse; cbx (1) c. snare (1) C. 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 Colon polyps, diverticulosis, hemorrhoids     DIAGNOSIS: A. COLON POLYP X 2, ASCENDING; COLD BIOPSY (1) AND COLD SNARE (1): - TUBULAR ADENOMAS, MULTIPLE FRAGMENTS. - NEGATIVE FOR HIGH-GRADE DYSPLASIA AND MALIGNANCY.  B.  COLON POLYP X 2, TRANSVERSE;  COLD BIOPSY (1) AND COLD SNARE (1): - TUBULAR ADENOMAS, 3 FRAGMENTS. - NEGATIVE FOR HIGH-GRADE DYSPLASIA AND MALIGNANCY.  C.  COLON POLYP X 2, DESCENDING; COLD SNARE: - TUBULAR ADENOMA, NEGATIVE FOR HIGH-GRADE DYSPLASIA AND MALIGNANCY. - HYPERPLASTIC POLYP, NEGATIVE FOR DYSPLASIA AND MALIGNANCY.  GROSS DESCRIPTION: A. Labeled: Ascending colon polyps x2, 1-cbx, 1-cold snare Received: Formalin Coll ection time: 10:30 AM on 09/27/2021 Placed into formalin time: 10:30 AM on 09/27/2021 Tissue fragment(s): Multiple Size: Aggregate, 1.4 x 1.2 x 0.3 cm Description: Received are fragments of tan soft tissue admixed with intestinal debris.  The ratio of soft tissue to intestinal debris is 90: 10.  The largest soft tissue fragment has a resection margin which is inked green.  This fragment is serially sectioned. Entirely submitted in cassettes 1-2 with the serially sectioned fragment in cassette 1 and the remaining fragments in cassette 2.  B. Labeled: Transverse colon polyps x2, 1-cold snare, 1-cbx Received: Formalin Collection time: 11:14 AM on 09/27/2021 Placed into formalin time: 11:14 AM  on 09/27/2021 Tissue fragment(s): 3 Size: Aggregate, 1 x 0.4 x 0.2 cm Description: Tan soft tissue fragments Entirely submitted in 1 cassette.  C. Labeled: Cold snare descending colon polyp x2 Received: Formalin Collection time: 11:18 AM on 09/27/2021 Placed into formalin time: 11:19 AM  on 09/27/2021 Tissue fragment(s): 2 Size: Range from 0.9 to 1 cm Description: Received are 2 fragments of tan-pink soft tissue.  The resection margins are differentially inked and the fragments are trisected. Entirely submitted in 1 cassette.  RB 09/27/2021  Final Diagnosis performed by Bryan Lemma, MD.   Electronically signed 09/29/2021 4:01:10PM The electronic signature indicates that the named Attending Pathologist has evaluated the specimen Technical component performed at [facility], [address], [place], [place] [address]  Lab: [phone] Dir: Rush Farmer, MD, MMM  Professional component performed at [facility], [facility], [place], [place], [place] [address] Lab: [phone] Dir: Kathi Simpers, MD   CBC with Differential     Status: None   Collection Time: 11/11/21  8:08 AM  Result Value Ref Range   WBC 7.3 4.0 - 10.5 K/uL   RBC 4.10 3.87 - 5.11 MIL/uL   Hemoglobin 12.4 12.0 - 15.0 g/dL   HCT 37.4 36.0 - 46.0 %   MCV 91.2 80.0 - 100.0 fL   MCH 30.2 26.0 - 34.0 pg   MCHC 33.2 30.0 - 36.0 g/dL   RDW 14.0 11.5 - 15.5 %   Platelets 206 150 - 400 K/uL   nRBC 0.0 0.0 - 0.2 %   Neutrophils Relative % 67 %   Neutro Abs 4.8 1.7 - 7.7 K/uL   Lymphocytes Relative 21 %   Lymphs Abs 1.6 0.7 - 4.0 K/uL   Monocytes Relative 8 %   Monocytes Absolute 0.6 0.1 - 1.0 K/uL   Eosinophils Relative 3 %   Eosinophils Absolute 0.2 0.0 - 0.5 K/uL   Basophils Relative 1 %   Basophils Absolute 0.1 0.0 - 0.1 K/uL   Immature Granulocytes 0 %   Abs Immature Granulocytes 0.03 0.00 - 0.07 K/uL    Comment: Performed at [facility], [address] [place], [place] [address]  Basic metabolic panel     Status: Abnormal   Collection Time: 11/11/21  8:08 AM  Result Value Ref Range   Sodium 137 135 - 145 mmol/L   Potassium 3.9 3.5 - 5.1 mmol/L   Chloride 105 98 - 111 mmol/L   CO2 27 22 - 32 mmol/L   Glucose, Bld 128 (H) 70 - 99 mg/dL    Comment: Glucose reference range applies only to samples taken after fasting for at least 8 hours.   BUN 15 8 - 23 mg/dL   Creatinine, Ser 0.65 0.44 - 1.00 mg/dL   Calcium 9.1 8.9 - 10.3 mg/dL   GFR, Estimated >60 >60 mL/min    Comment: (NOTE) Calculated using the CKD-EPI Creatinine Equation (2021)    Anion gap 5 5 - 15    Comment: Performed at [facility], [address] [place],  [address]    Radiology: CT Chest High Resolution  Result Date: 11/23/2021 CLINICAL DATA:  Interstitial lung disease EXAM: CT CHEST WITHOUT  CONTRAST TECHNIQUE: Multidetector CT imaging of the chest was performed following the standard protocol without intravenous contrast. High resolution imaging of the lungs, as well as inspiratory and expiratory imaging, was performed. RADIATION DOSE REDUCTION: This exam was performed according to the departmental dose-optimization program which includes automated exposure control, adjustment of the mA  and/or kV according to patient size and/or use of iterative reconstruction technique. COMPARISON:  Chest CT dated June 06, 2010 FINDINGS: Cardiovascular: Normal heart size. No pericardial effusion. Mitral annular calcifications. Mild aortic valve calcifications. Normal caliber thoracic aorta with mild calcified plaque. Mediastinum/Nodes: Thyroid and esophagus are unremarkable. Mildly enlarged right subcarinal lymph node measuring 1.1 cm in short axis on series 12, image 42, likely reactive. Lungs/Pleura: Central airways are patent. No significant air trapping. Large rounded consolidations of the right upper lobe and right lower lobe measuring 3.1 x 2.4 cm on series 2, image 24 and 2.5 x 0.9 cm on image 87. Smaller irregular nodular opacity of the posterior right upper lobe measuring 11 x 8 mm on series 8, image 72. Small solid pulmonary nodule of the right upper lobe measuring 3 mm on image 85. Linear consolidation of the left lower lobe with associated tiny 1-2 mm pulmonary nodules. No pleural effusion or pneumothorax. No evidence of fibrotic interstitial lung disease. Upper Abdomen: Cholelithiasis.  No acute abnormality. Musculoskeletal: Acute appearing fractures of the anterior 5th and 6th ribs. IMPRESSION: 1. Large rounded dense consolidations of the right upper lobe and right lower lobe, differential considerations include atypical infectious process, inflammatory process such as organizing pneumonia, or neoplasm. Recommend short-term follow-up chest CT in 3 months and/or tissue sampling. 2. Linear  consolidation of the left lower lobe with associated tiny pulmonary nodules, likely sequela of aspiration or infection. Recommend attention on follow-up. 3. Additional smaller irregular solid pulmonary nodule is seen in the posterior right upper lobe. Recommend attention on follow-up. 4. Mild aortic valve calcifications, findings can be seen in the setting of aortic sclerosis or stenosis. Correlate with echocardiography. 5. Acute appearing fractures of the anterior 5th and 6th ribs. 6. Aortic Atherosclerosis (ICD10-I70.0). Electronically Signed   By: Yetta Glassman M.D.   On: 11/23/2021 09:26    No results found.  CT Chest High Resolution  Result Date: 11/23/2021 CLINICAL DATA:  Interstitial lung disease EXAM: CT CHEST WITHOUT CONTRAST TECHNIQUE: Multidetector CT imaging of the chest was performed following the standard protocol without intravenous contrast. High resolution imaging of the lungs, as well as inspiratory and expiratory imaging, was performed. RADIATION DOSE REDUCTION: This exam was performed according to the departmental dose-optimization program which includes automated exposure control, adjustment of the mA and/or kV according to patient size and/or use of iterative reconstruction technique. COMPARISON:  Chest CT dated June 06, 2010 FINDINGS: Cardiovascular: Normal heart size. No pericardial effusion. Mitral annular calcifications. Mild aortic valve calcifications. Normal caliber thoracic aorta with mild calcified plaque. Mediastinum/Nodes: Thyroid and esophagus are unremarkable. Mildly enlarged right subcarinal lymph node measuring 1.1 cm in short axis on series 12, image 42, likely reactive. Lungs/Pleura: Central airways are patent. No significant air trapping. Large rounded consolidations of the right upper lobe and right lower lobe measuring 3.1 x 2.4 cm on series 2, image 24 and 2.5 x 0.9 cm on image 87. Smaller irregular nodular opacity of the posterior right upper lobe measuring 11  x 8 mm on series 8, image 72. Small solid pulmonary nodule of the right upper lobe measuring 3 mm on image 85. Linear consolidation of the left lower lobe with associated tiny 1-2 mm pulmonary nodules. No pleural effusion or pneumothorax. No evidence of fibrotic interstitial lung disease. Upper Abdomen: Cholelithiasis.  No acute abnormality. Musculoskeletal: Acute appearing fractures of the anterior 5th and 6th ribs. IMPRESSION: 1. Large rounded dense consolidations of the right upper lobe and right lower lobe, differential  considerations include atypical infectious process, inflammatory process such as organizing pneumonia, or neoplasm. Recommend short-term follow-up chest CT in 3 months and/or tissue sampling. 2. Linear consolidation of the left lower lobe with associated tiny pulmonary nodules, likely sequela of aspiration or infection. Recommend attention on follow-up. 3. Additional smaller irregular solid pulmonary nodule is seen in the posterior right upper lobe. Recommend attention on follow-up. 4. Mild aortic valve calcifications, findings can be seen in the setting of aortic sclerosis or stenosis. Correlate with echocardiography. 5. Acute appearing fractures of the anterior 5th and 6th ribs. 6. Aortic Atherosclerosis (ICD10-I70.0). Electronically Signed   By: Yetta Glassman M.D.   On: 11/23/2021 09:26   DG Chest 2 View  Result Date: 11/11/2021 CLINICAL DATA:  Cough.  Rib pain after fall. EXAM: CHEST - 2 VIEW COMPARISON:  October 26, 2011. FINDINGS: Stable cardiomediastinal silhouette. Both lungs are clear. The visualized skeletal structures are unremarkable. IMPRESSION: No active cardiopulmonary disease. Electronically Signed   By: Marijo Conception M.D.   On: 11/11/2021 08:28      Assessment and Plan: Patient Active Problem List   Diagnosis Date Noted   Shortness of breath 11/16/2021   Rheumatoid arthritis involving multiple sites with positive rheumatoid factor ([place]) 11/16/2021   Other chest pain  11/16/2021   ILD (interstitial lung disease) ([place]) 11/16/2021    1. Pulmonary mass We will go ahead and get her scheduled for a PET scan imaging to be done to assess if there is any abnormal uptake - NM PET Image Initial (PI) Skull Base To Thigh (F-18 FDG); Future  2. GERD without esophagitis Discussed reflux prevention and also the risk of aspiration.  She will try to elevate her bed and continue with PPIs and H2 blockers  3. Rheumatoid arthritis involving multiple sites with positive rheumatoid factor ([place]) Follow-up with her primary rheumatologist.  Alternatively the abnormal findings on the imaging studies may reflect rheumatological issues involving the lungs.  General Counseling: I have discussed the findings of the evaluation and examination with [facility].  I have also discussed any further diagnostic evaluation thatmay be needed or ordered today. Mabry verbalizes understanding of the findings of todays visit. We also reviewed her medications today and discussed drug interactions and side effects including but not limited excessive drowsiness and altered mental states. We also discussed that there is always a risk not just to her but also people around her. she has been encouraged to call the office with any questions or concerns that should arise related to todays visit.  No orders of the defined types were placed in this encounter.    Time spent: 88  I have personally obtained a history, examined the patient, evaluated laboratory and imaging results, formulated the assessment and plan and placed orders.    Allyne Gee, MD [facility] Pulmonary and Critical Care Sleep medicine

## 2021-12-01 ENCOUNTER — Ambulatory Visit: Payer: Medicare HMO | Admitting: Internal Medicine

## 2021-12-01 DIAGNOSIS — R0602 Shortness of breath: Secondary | ICD-10-CM | POA: Diagnosis not present

## 2021-12-08 ENCOUNTER — Other Ambulatory Visit: Payer: Self-pay | Admitting: Nurse Practitioner

## 2021-12-08 DIAGNOSIS — R918 Other nonspecific abnormal finding of lung field: Secondary | ICD-10-CM

## 2021-12-09 ENCOUNTER — Ambulatory Visit: Payer: Medicare HMO | Admitting: Internal Medicine

## 2021-12-09 ENCOUNTER — Encounter
Admission: RE | Admit: 2021-12-09 | Discharge: 2021-12-09 | Disposition: A | Discharge status: Home / Self Care | Payer: Medicare HMO | Source: Ambulatory Visit | Attending: Internal Medicine | Admitting: Internal Medicine

## 2021-12-09 DIAGNOSIS — R918 Other nonspecific abnormal finding of lung field: Secondary | ICD-10-CM | POA: Diagnosis not present

## 2021-12-09 DIAGNOSIS — R911 Solitary pulmonary nodule: Secondary | ICD-10-CM | POA: Diagnosis not present

## 2021-12-09 DIAGNOSIS — J849 Interstitial pulmonary disease, unspecified: Secondary | ICD-10-CM | POA: Insufficient documentation

## 2021-12-09 LAB — GLUCOSE, CAPILLARY: Glucose-Capillary: 113 mg/dL — ABNORMAL HIGH (ref 70–99)

## 2021-12-09 MED ORDER — FLUDEOXYGLUCOSE F - 18 (FDG) INJECTION
9.0000 | Freq: Once | INTRAVENOUS | Status: AC
  Administered 2021-12-09: 9.61 via INTRAVENOUS

## 2021-12-12 NOTE — Procedures (Signed)
[facility] [address] [place], [address]    Complete Pulmonary Function Testing Interpretation:  FINDINGS:  Forced vital capacity is mildly decreased.  FEV1 is 1.19 L which is 68% of predicted and is mildly decreased. FEV1 FVC ratio is mildly decreased.  Postbronchodilator no significant change in FEV1.  Total lung capacity is mildly decreased.  Residual volume is normal.  Residual on total capacity ratio is increased.  FRC was normal.  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

## 2021-12-13 ENCOUNTER — Encounter: Payer: Self-pay | Admitting: Internal Medicine

## 2021-12-13 ENCOUNTER — Ambulatory Visit: Payer: Medicare HMO | Admitting: Internal Medicine

## 2021-12-13 VITALS — BP 162/69 | HR 92 | Temp 98.0°F | Resp 16 | Ht 61.0 in | Wt 175.8 lb

## 2021-12-13 DIAGNOSIS — J449 Chronic obstructive pulmonary disease, unspecified: Secondary | ICD-10-CM | POA: Diagnosis not present

## 2021-12-13 DIAGNOSIS — J849 Interstitial pulmonary disease, unspecified: Secondary | ICD-10-CM | POA: Diagnosis not present

## 2021-12-13 DIAGNOSIS — M0579 Rheumatoid arthritis with rheumatoid factor of multiple sites without organ or systems involvement: Secondary | ICD-10-CM | POA: Diagnosis not present

## 2021-12-13 DIAGNOSIS — R918 Other nonspecific abnormal finding of lung field: Secondary | ICD-10-CM | POA: Diagnosis not present

## 2021-12-13 LAB — PULMONARY FUNCTION TEST

## 2021-12-13 MED ORDER — BREZTRI AEROSPHERE 160-9-4.8 MCG/ACT IN AERO: 2.0000 | INHALATION_SPRAY | Freq: Two times a day (BID) | RESPIRATORY_TRACT | 11 refills | Status: DC

## 2021-12-13 NOTE — Progress Notes (Signed)
[facility] [place], [place] [address]  Pulmonary Sleep Medicine   Office Visit Note  Patient Name: Kim Bentley DOB: 11-22-1940 MRN [id number]  Date of Service: 12/13/2021  Complaints/HPI: Follow up for PET and PFT. She had PET scan done shows a low SUV and no major change from the last CT scan. We discussed the findings and options of surgical or oncology referral and she would like to wait for 6 months for [place] to repeat a CT scan of the chest. PFT shows MILD COPD. She states currently is on albuterol and advair. She still has some SOB. She is using the inhalers as needed.   ROS  General: (-) fever, (-) chills, (-) night sweats, (-) weakness Skin: (-) rashes, (-) itching,. Eyes: (-) visual changes, (-) redness, (-) itching. Nose and Sinuses: (-) nasal stuffiness or itchiness, (-) postnasal drip, (-) nosebleeds, (-) sinus trouble. Mouth and Throat: (-) sore throat, (-) hoarseness. Neck: (-) swollen glands, (-) enlarged thyroid, (-) neck pain. Respiratory: - cough, (-) bloody sputum, + shortness of breath, - wheezing. Cardiovascular: - ankle swelling, (-) chest pain. Lymphatic: (-) lymph node enlargement. Neurologic: (-) numbness, (-) tingling. Psychiatric: (-) anxiety, (-) depression   Current Medication: Outpatient Encounter Medications as of 12/13/2021  Medication Sig   albuterol (VENTOLIN HFA) 108 (90 Base) MCG/ACT inhaler Inhale 2 puffs into the lungs every 6 (six) hours as needed for wheezing or shortness of breath.   ALPRAZolam (XANAX) 0.5 MG tablet Take 0.5 mg by mouth every 8 (eight) hours as needed for anxiety.   aspirin 81 MG EC tablet Take by mouth.   calcium-vitamin D (OSCAL WITH D) 500-200 MG-UNIT tablet Take 1 tablet by mouth 2 (two) times daily.   Cyanocobalamin 1000 MCG TBCR Take by mouth.   cyclobenzaprine (FLEXERIL) 5 MG tablet Take one tab twice a day, ( may cause drowsiness) for 15 days   fluticasone-salmeterol (ADVAIR) 250-50  MCG/ACT AEPB Inhale into the lungs.   folic acid (FOLVITE) 1 MG tablet Take by mouth.   hydroxychloroquine (PLAQUENIL) 200 MG tablet Take 1 tablet by mouth 2 (two) times daily.   lidocaine (LIDODERM) 5 % Place 1 patch onto the skin every 12 (twelve) hours. Remove & Discard patch within 12 hours or as directed by MD   meloxicam (MOBIC) 7.5 MG tablet Take by mouth.   methotrexate (RHEUMATREX) 2.5 MG tablet Take by mouth.   OMEGA-3 FATTY ACIDS PO Take by mouth.   pantoprazole (PROTONIX) 40 MG tablet Take 40 mg by mouth daily.   vitamin C (ASCORBIC ACID) 500 MG tablet Take 500 mg by mouth daily.   No facility-administered encounter medications on file as of 12/13/2021.    Surgical History: Past Surgical History:  Procedure Laterality Date   COLONOSCOPY     COLONOSCOPY N/A 09/27/2021   Procedure: COLONOSCOPY;  Surgeon: Lesly Rubenstein, MD;  Location: [facility] ENDOSCOPY;  Service: Endoscopy;  Laterality: N/A;   COLONOSCOPY WITH PROPOFOL N/A 03/26/2018   Procedure: COLONOSCOPY WITH PROPOFOL;  Surgeon: Lollie Sails, MD;  Location: [facility] ENDOSCOPY;  Service: Endoscopy;  Laterality: N/A;   ESOPHAGOGASTRODUODENOSCOPY     HERNIA REPAIR     TONSILLECTOMY      Medical History: Past Medical History:  Diagnosis Date   Allergic genetic state    Arthritis    Bladder spasms    GERD (gastroesophageal reflux disease)    HLD (hyperlipidemia)     Family History: Family History  Problem Relation Age of  Onset   Breast cancer Sister 30    Social History: Social History   Socioeconomic History   Marital status: Married    Spouse name: Not on file   Number of children: Not on file   Years of education: Not on file   Highest education level: Not on file  Occupational History   Not on file  Tobacco Use   Smoking status: Never   Smokeless tobacco: Never  Vaping Use   Vaping Use: Never used  Substance and Sexual Activity   Alcohol use: Never   Drug use: Never   Sexual activity: Not on  file  Other Topics Concern   Not on file  Social History Narrative   Not on file   Social Determinants of Health   Financial Resource Strain: Not on file  Food Insecurity: Not on file  Transportation Needs: Not on file  Physical Activity: Not on file  Stress: Not on file  Social Connections: Not on file  Intimate Partner Violence: Not on file    Vital Signs: There were no vitals taken for this visit.  Examination: General Appearance: The patient is well-developed, well-nourished, and in no distress. Skin: Gross inspection of skin unremarkable. Head: normocephalic, no gross deformities. Eyes: no gross deformities noted. ENT: ears appear grossly normal no exudates. Neck: Supple. No thyromegaly. No LAD. Respiratory: few rhonchi noted. Cardiovascular: Normal S1 and S2 without murmur or rub. Extremities: No cyanosis. pulses are equal. Neurologic: Alert and oriented. No involuntary movements.  LABS: Recent Results (from the past 2160 hour(s))  Surgical pathology     Status: None   Collection Time: 09/27/21 10:30 AM  Result Value Ref Range   SURGICAL PATHOLOGY      SURGICAL PATHOLOGY CASE: [id number] PATIENT: Kim Bentley Surgical Pathology Report     Specimen Submitted: A. Colon polyp x2, ascending; cbx (1) c. snare (1) B. Colon polyp x2, transverse; cbx (1) c. snare (1) C. 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 Colon polyps, diverticulosis, hemorrhoids     DIAGNOSIS: A. COLON POLYP X 2, ASCENDING; COLD BIOPSY (1) AND COLD SNARE (1): - TUBULAR ADENOMAS, MULTIPLE FRAGMENTS. - NEGATIVE FOR HIGH-GRADE DYSPLASIA AND MALIGNANCY.  B.  COLON POLYP X 2, TRANSVERSE; COLD BIOPSY (1) AND COLD SNARE (1): - TUBULAR ADENOMAS, 3 FRAGMENTS. - NEGATIVE FOR HIGH-GRADE DYSPLASIA AND MALIGNANCY.  C.  COLON POLYP X 2, DESCENDING; COLD SNARE: - TUBULAR ADENOMA, NEGATIVE FOR HIGH-GRADE DYSPLASIA AND MALIGNANCY. -  HYPERPLASTIC POLYP, NEGATIVE FOR DYSPLASIA AND MALIGNANCY.  GROSS DESCRIPTION: A. Labeled: Ascending colon polyps x2, 1-cbx, 1-cold snare Received: Formalin Coll ection time: 10:30 AM on 09/27/2021 Placed into formalin time: 10:30 AM on 09/27/2021 Tissue fragment(s): Multiple Size: Aggregate, 1.4 x 1.2 x 0.3 cm Description: Received are fragments of tan soft tissue admixed with intestinal debris.  The ratio of soft tissue to intestinal debris is 90: 10.  The largest soft tissue fragment has a resection margin which is inked green.  This fragment is serially sectioned. Entirely submitted in cassettes 1-2 with the serially sectioned fragment in cassette 1 and the remaining fragments in cassette 2.  B. Labeled: Transverse colon polyps x2, 1-cold snare, 1-cbx Received: Formalin Collection time: 11:14 AM on 09/27/2021 Placed into formalin time: 11:14 AM on 09/27/2021 Tissue fragment(s): 3 Size: Aggregate, 1 x 0.4 x 0.2 cm Description: Tan soft tissue fragments Entirely submitted in 1 cassette.  C. Labeled: Cold snare descending colon polyp x2 Received: Formalin Collection  time: 11:18 AM on 09/27/2021 Placed into formalin time: 11:19 AM  on 09/27/2021 Tissue fragment(s): 2 Size: Range from 0.9 to 1 cm Description: Received are 2 fragments of tan-pink soft tissue.  The resection margins are differentially inked and the fragments are trisected. Entirely submitted in 1 cassette.  RB 09/27/2021  Final Diagnosis performed by Bryan Lemma, MD.   Electronically signed 09/29/2021 4:01:10PM The electronic signature indicates that the named Attending Pathologist has evaluated the specimen Technical component performed at [facility], [address] [place], [place] [address] Lab: [phone] Dir: Rush Farmer, MD, MMM  Professional component performed at [facility], [facility], [place], [place], [place] [address] Lab: [phone] Dir: Kathi Simpers, MD   CBC with Differential      Status: None   Collection Time: 11/11/21  8:08 AM  Result Value Ref Range   WBC 7.3 4.0 - 10.5 K/uL   RBC 4.10 3.87 - 5.11 MIL/uL   Hemoglobin 12.4 12.0 - 15.0 g/dL   HCT 37.4 36.0 - 46.0 %   MCV 91.2 80.0 - 100.0 fL   MCH 30.2 26.0 - 34.0 pg   MCHC 33.2 30.0 - 36.0 g/dL   RDW 14.0 11.5 - 15.5 %   Platelets 206 150 - 400 K/uL   nRBC 0.0 0.0 - 0.2 %   Neutrophils Relative % 67 %   Neutro Abs 4.8 1.7 - 7.7 K/uL   Lymphocytes Relative 21 %   Lymphs Abs 1.6 0.7 - 4.0 K/uL   Monocytes Relative 8 %   Monocytes Absolute 0.6 0.1 - 1.0 K/uL   Eosinophils Relative 3 %   Eosinophils Absolute 0.2 0.0 - 0.5 K/uL   Basophils Relative 1 %   Basophils Absolute 0.1 0.0 - 0.1 K/uL   Immature Granulocytes 0 %   Abs Immature Granulocytes 0.03 0.00 - 0.07 K/uL    Comment: Performed at [facility], [address] [place], [place] [address]  Basic metabolic panel     Status: Abnormal   Collection Time: 11/11/21  8:08 AM  Result Value Ref Range   Sodium 137 135 - 145 mmol/L   Potassium 3.9 3.5 - 5.1 mmol/L   Chloride 105 98 - 111 mmol/L   CO2 27 22 - 32 mmol/L   Glucose, Bld 128 (H) 70 - 99 mg/dL    Comment: Glucose reference range applies only to samples taken after fasting for at least 8 hours.   BUN 15 8 - 23 mg/dL   Creatinine, Ser 0.65 0.44 - 1.00 mg/dL   Calcium 9.1 8.9 - 10.3 mg/dL   GFR, Estimated >60 >60 mL/min    Comment: (NOTE) Calculated using the CKD-EPI Creatinine Equation (2021)    Anion gap 5 5 - 15    Comment: Performed at [facility], [address] [place], [place] [address]  Glucose, capillary     Status: Abnormal   Collection Time: 12/09/21 12:16 PM  Result Value Ref Range   Glucose-Capillary 113 (H) 70 - 99 mg/dL    Comment: Glucose reference range applies only to samples taken after fasting for at least 8 hours.    Radiology: NM PET Image Initial (PI) Skull Base To Thigh (F-18 FDG)  Result Date: 12/11/2021 CLINICAL DATA:  Initial treatment  strategy for pulmonary nodule. EXAM: NUCLEAR MEDICINE PET SKULL BASE TO THIGH TECHNIQUE: 9.6 mCi F-18 FDG was injected intravenously. Full-ring PET imaging was performed from the skull base to thigh after the radiotracer. CT data was obtained  and used for attenuation correction and anatomic localization. Fasting blood glucose: 113 mg/dl COMPARISON:  High-resolution chest CT 11/22/2021 FINDINGS: Mediastinal blood pool activity: SUV max 2.1 Liver activity: SUV max NA NECK: No hypermetabolic lymph nodes in the neck. Incidental CT findings: None. CHEST: 3.4 cm right upper lobe nodular opacity is not substantially changed since prior CT scan when it was measured at 3.1 cm. Lesion shows low level FDG accumulation with SUV max = 2.7. Posterior left lower lobe nodule is somewhat motion obscured on CT imaging but also appears not substantially changed at 2.4 cm today compared to 2.5 cm previously. SUV max = 1.7. Stable 11 mm irregular subsolid nodule posterior right upper lobe on image 71/2 without hypermetabolism on PET imaging. No hypermetabolic lymphadenopathy in the mediastinum or either hilum. Incidental CT findings: Mild atherosclerotic calcification is noted in the wall of the thoracic aorta. Mitral annular calcification evident. ABDOMEN/PELVIS: No abnormal hypermetabolic activity within the liver, pancreas, adrenal glands, or spleen. No hypermetabolic lymph nodes in the abdomen or pelvis. Incidental CT findings: 2 cm calcified gallstone evident. There is mild atherosclerotic calcification of the abdominal aorta without aneurysm. SKELETON: No focal hypermetabolic activity to suggest skeletal metastasis. Low level FDG uptake is identified with nonacute anterior right rib fractures. Incidental CT findings: None. IMPRESSION: 1. The nodular consolidative opacities seen previously in the right upper and lower lobes are similar in size in the 2-3 week history since prior CT. Both show low level FDG accumulation and may be  infectious/inflammatory. As well differentiated or low-grade neoplasm can be poorly FDG avid, close continued follow-up warranted. 2. Tiny 11 mm irregular subsolid nodule posterior right upper lobe shows no hypermetabolism on PET imaging. Continued attention on follow-up recommended. 3. No unexpected or suspicious hypermetabolic activity in the neck, chest, abdomen, or pelvis. 4. Nonacute anterior right rib fractures again noted. 5.  Aortic Atherosclerois (ICD10-170.0) Electronically Signed   By: Misty Stanley M.D.   On: 12/11/2021 12:29    NM PET Image Initial (PI) Skull Base To Thigh (F-18 FDG)  Result Date: 12/11/2021 CLINICAL DATA:  Initial treatment strategy for pulmonary nodule. EXAM: NUCLEAR MEDICINE PET SKULL BASE TO THIGH TECHNIQUE: 9.6 mCi F-18 FDG was injected intravenously. Full-ring PET imaging was performed from the skull base to thigh after the radiotracer. CT data was obtained and used for attenuation correction and anatomic localization. Fasting blood glucose: 113 mg/dl COMPARISON:  High-resolution chest CT 11/22/2021 FINDINGS: Mediastinal blood pool activity: SUV max 2.1 Liver activity: SUV max NA NECK: No hypermetabolic lymph nodes in the neck. Incidental CT findings: None. CHEST: 3.4 cm right upper lobe nodular opacity is not substantially changed since prior CT scan when it was measured at 3.1 cm. Lesion shows low level FDG accumulation with SUV max = 2.7. Posterior left lower lobe nodule is somewhat motion obscured on CT imaging but also appears not substantially changed at 2.4 cm today compared to 2.5 cm previously. SUV max = 1.7. Stable 11 mm irregular subsolid nodule posterior right upper lobe on image 71/2 without hypermetabolism on PET imaging. No hypermetabolic lymphadenopathy in the mediastinum or either hilum. Incidental CT findings: Mild atherosclerotic calcification is noted in the wall of the thoracic aorta. Mitral annular calcification evident. ABDOMEN/PELVIS: No abnormal  hypermetabolic activity within the liver, pancreas, adrenal glands, or spleen. No hypermetabolic lymph nodes in the abdomen or pelvis. Incidental CT findings: 2 cm calcified gallstone evident. There is mild atherosclerotic calcification of the abdominal aorta without aneurysm. SKELETON: No focal hypermetabolic activity  to suggest skeletal metastasis. Low level FDG uptake is identified with nonacute anterior right rib fractures. Incidental CT findings: None. IMPRESSION: 1. The nodular consolidative opacities seen previously in the right upper and lower lobes are similar in size in the 2-3 week history since prior CT. Both show low level FDG accumulation and may be infectious/inflammatory. As well differentiated or low-grade neoplasm can be poorly FDG avid, close continued follow-up warranted. 2. Tiny 11 mm irregular subsolid nodule posterior right upper lobe shows no hypermetabolism on PET imaging. Continued attention on follow-up recommended. 3. No unexpected or suspicious hypermetabolic activity in the neck, chest, abdomen, or pelvis. 4. Nonacute anterior right rib fractures again noted. 5.  Aortic Atherosclerois (ICD10-170.0) Electronically Signed   By: Misty Stanley M.D.   On: 12/11/2021 12:29    NM PET Image Initial (PI) Skull Base To Thigh (F-18 FDG)  Result Date: 12/11/2021 CLINICAL DATA:  Initial treatment strategy for pulmonary nodule. EXAM: NUCLEAR MEDICINE PET SKULL BASE TO THIGH TECHNIQUE: 9.6 mCi F-18 FDG was injected intravenously. Full-ring PET imaging was performed from the skull base to thigh after the radiotracer. CT data was obtained and used for attenuation correction and anatomic localization. Fasting blood glucose: 113 mg/dl COMPARISON:  High-resolution chest CT 11/22/2021 FINDINGS: Mediastinal blood pool activity: SUV max 2.1 Liver activity: SUV max NA NECK: No hypermetabolic lymph nodes in the neck. Incidental CT findings: None. CHEST: 3.4 cm right upper lobe nodular opacity is not  substantially changed since prior CT scan when it was measured at 3.1 cm. Lesion shows low level FDG accumulation with SUV max = 2.7. Posterior left lower lobe nodule is somewhat motion obscured on CT imaging but also appears not substantially changed at 2.4 cm today compared to 2.5 cm previously. SUV max = 1.7. Stable 11 mm irregular subsolid nodule posterior right upper lobe on image 71/2 without hypermetabolism on PET imaging. No hypermetabolic lymphadenopathy in the mediastinum or either hilum. Incidental CT findings: Mild atherosclerotic calcification is noted in the wall of the thoracic aorta. Mitral annular calcification evident. ABDOMEN/PELVIS: No abnormal hypermetabolic activity within the liver, pancreas, adrenal glands, or spleen. No hypermetabolic lymph nodes in the abdomen or pelvis. Incidental CT findings: 2 cm calcified gallstone evident. There is mild atherosclerotic calcification of the abdominal aorta without aneurysm. SKELETON: No focal hypermetabolic activity to suggest skeletal metastasis. Low level FDG uptake is identified with nonacute anterior right rib fractures. Incidental CT findings: None. IMPRESSION: 1. The nodular consolidative opacities seen previously in the right upper and lower lobes are similar in size in the 2-3 week history since prior CT. Both show low level FDG accumulation and may be infectious/inflammatory. As well differentiated or low-grade neoplasm can be poorly FDG avid, close continued follow-up warranted. 2. Tiny 11 mm irregular subsolid nodule posterior right upper lobe shows no hypermetabolism on PET imaging. Continued attention on follow-up recommended. 3. No unexpected or suspicious hypermetabolic activity in the neck, chest, abdomen, or pelvis. 4. Nonacute anterior right rib fractures again noted. 5.  Aortic Atherosclerois (ICD10-170.0) Electronically Signed   By: Misty Stanley M.D.   On: 12/11/2021 12:29   CT Chest High Resolution  Result Date:  11/23/2021 CLINICAL DATA:  Interstitial lung disease EXAM: CT CHEST WITHOUT CONTRAST TECHNIQUE: Multidetector CT imaging of the chest was performed following the standard protocol without intravenous contrast. High resolution imaging of the lungs, as well as inspiratory and expiratory imaging, was performed. RADIATION DOSE REDUCTION: This exam was performed according to the departmental dose-optimization program which  includes automated exposure control, adjustment of the mA and/or kV according to patient size and/or use of iterative reconstruction technique. COMPARISON:  Chest CT dated June 06, 2010 FINDINGS: Cardiovascular: Normal heart size. No pericardial effusion. Mitral annular calcifications. Mild aortic valve calcifications. Normal caliber thoracic aorta with mild calcified plaque. Mediastinum/Nodes: Thyroid and esophagus are unremarkable. Mildly enlarged right subcarinal lymph node measuring 1.1 cm in short axis on series 12, image 42, likely reactive. Lungs/Pleura: Central airways are patent. No significant air trapping. Large rounded consolidations of the right upper lobe and right lower lobe measuring 3.1 x 2.4 cm on series 2, image 24 and 2.5 x 0.9 cm on image 87. Smaller irregular nodular opacity of the posterior right upper lobe measuring 11 x 8 mm on series 8, image 72. Small solid pulmonary nodule of the right upper lobe measuring 3 mm on image 85. Linear consolidation of the left lower lobe with associated tiny 1-2 mm pulmonary nodules. No pleural effusion or pneumothorax. No evidence of fibrotic interstitial lung disease. Upper Abdomen: Cholelithiasis.  No acute abnormality. Musculoskeletal: Acute appearing fractures of the anterior 5th and 6th ribs. IMPRESSION: 1. Large rounded dense consolidations of the right upper lobe and right lower lobe, differential considerations include atypical infectious process, inflammatory process such as organizing pneumonia, or neoplasm. Recommend short-term  follow-up chest CT in 3 months and/or tissue sampling. 2. Linear consolidation of the left lower lobe with associated tiny pulmonary nodules, likely sequela of aspiration or infection. Recommend attention on follow-up. 3. Additional smaller irregular solid pulmonary nodule is seen in the posterior right upper lobe. Recommend attention on follow-up. 4. Mild aortic valve calcifications, findings can be seen in the setting of aortic sclerosis or stenosis. Correlate with echocardiography. 5. Acute appearing fractures of the anterior 5th and 6th ribs. 6. Aortic Atherosclerosis (ICD10-I70.0). Electronically Signed   By: Yetta Glassman M.D.   On: 11/23/2021 09:26      Assessment and Plan: Patient Active Problem List   Diagnosis Date Noted   Shortness of breath 11/16/2021   Rheumatoid arthritis involving multiple sites with positive rheumatoid factor ([place]) 11/16/2021   Other chest pain 11/16/2021   ILD (interstitial lung disease) ([place]) 11/16/2021    1. Pulmonary mass LOW SUV mass. Will schedule for a follow up CT chest. I personally looked at the PET with the patient and reviewed the details. Option to see consultation given will wait for 6 months.  She did mention she may consider seeing Dr. Rogue Bussing at a later time when we spoke. - CT Chest High Resolution; Future  2. Rheumatoid arthritis involving multiple sites with positive rheumatoid factor ([place]) Follow up with Rheum  3. Obstructive chronic bronchitis without exacerbation (H[facility]) Gave samples and script with [facility]  4. ILD (interstitial lung disease) ([place])  - CT Chest High Resolution; Future   General Counseling: I have discussed the findings of the evaluation and examination with [facility].  I have also discussed any further diagnostic evaluation thatmay be needed or ordered today. Nayra verbalizes understanding of the findings of todays visit. We also reviewed her medications today and discussed drug interactions and side effects including  but not limited excessive drowsiness and altered mental states. We also discussed that there is always a risk not just to her but also people around her. she has been encouraged to call the office with any questions or concerns that should arise related to todays visit.  No orders of the defined types were placed in this encounter.  Time spent: 81  I have personally obtained a history, examined the patient, evaluated laboratory and imaging results, formulated the assessment and plan and placed orders.    Allyne Gee, MD [facility] Pulmonary and Critical Care Sleep medicine

## 2021-12-14 ENCOUNTER — Ambulatory Visit: Payer: Medicare HMO | Admitting: Internal Medicine

## 2021-12-24 ENCOUNTER — Ambulatory Visit: Payer: Medicare HMO | Admitting: Internal Medicine

## 2021-12-24 ENCOUNTER — Other Ambulatory Visit: Payer: Medicare HMO

## 2021-12-31 ENCOUNTER — Encounter: Payer: Self-pay | Admitting: Internal Medicine

## 2021-12-31 ENCOUNTER — Inpatient Hospital Stay: Payer: Medicare HMO

## 2021-12-31 ENCOUNTER — Inpatient Hospital Stay: Payer: Medicare HMO | Attending: Internal Medicine | Admitting: Internal Medicine

## 2021-12-31 ENCOUNTER — Encounter: Payer: Self-pay | Admitting: *Deleted

## 2021-12-31 DIAGNOSIS — R918 Other nonspecific abnormal finding of lung field: Secondary | ICD-10-CM | POA: Diagnosis not present

## 2021-12-31 DIAGNOSIS — J449 Chronic obstructive pulmonary disease, unspecified: Secondary | ICD-10-CM | POA: Insufficient documentation

## 2021-12-31 NOTE — Progress Notes (Unsigned)
No concerns. 

## 2021-12-31 NOTE — Progress Notes (Signed)
Met with patient during initial consult with Dr. Rogue Bussing to discuss recent imaging findings and next steps. All questions answered during visit. Informed pt that will discuss her case at conference next Thursday and she will be called to review recommendations. Contact info given and instructed pt to call with any questions or needs. Pt verbalized understanding. Nothing further needed at this time.

## 2021-12-31 NOTE — Assessment & Plan Note (Signed)
1. The nodular consolidative opacities seen previously in the right upper and lower lobes are similar in size in the 2-3 week history since prior CT. Both show low level FDG accumulation and may be infectious/inflammatory. As well differentiated or low-grade neoplasm can be poorly FDG avid, close continued follow-up warranted. 2. Tiny 11 mm irregular subsolid nodule posterior right upper lobe shows no hypermetabolism on PET imaging. Continued attention on follow-up recommended. 3. No unexpected or suspicious hypermetabolic activity in the neck, chest, abdomen, or pelvis. 4. Nonacute anterior right rib fractures again noted. 5.  Aortic Atherosclerois (ICD10-170.0)  # COugh/COPD- recommend antihistamines..   # DISPOSITION: # no labs-  # follow up TBD- Dr.B

## 2021-12-31 NOTE — Progress Notes (Signed)
[place] NOTE  Patient Care Team: Juluis Pitch, MD as PCP - General (Family Medicine) Telford Nab, RN as Oncology Nurse Navigator  CHIEF COMPLAINTS/PURPOSE OF CONSULTATION: lung nodule/mass  # IMPRESSION: 1. The nodular consolidative opacities seen previously in the right upper and lower lobes are similar in size in the 2-3 week history since prior CT. Both show low level FDG accumulation and may be infectious/inflammatory. As well differentiated or low-grade neoplasm can be poorly FDG avid, close continued follow-up warranted. 2. Tiny 11 mm irregular subsolid nodule posterior right upper lobe shows no hypermetabolism on PET imaging. Continued attention on follow-up recommended. 3. No unexpected or suspicious hypermetabolic activity in the neck, chest, abdomen, or pelvis. 4. Nonacute anterior right rib fractures again noted. 5.  Aortic Atherosclerois (ICD10-170.0)   Oncology History   No history exists.     HISTORY OF PRESENTING ILLNESS: Alone.  Ambulating independently.  Kim Bentley 81 y.o.  female NO history of smoking is here for further evaluation and recommendations for lung mass.   Patient had a fall.  On incidentally further work-up noted to have right-sided lung nodules which led to further work-up including a CAT scan and PET scan.  Patient has mild COPD; patient has mild to moderate cough.  She is on inhalers.  Review of Systems  Constitutional:  Positive for malaise/fatigue. Negative for chills, diaphoresis, fever and weight loss.  HENT:  Negative for nosebleeds and sore throat.   Eyes:  Negative for double vision.  Respiratory:  Positive for cough. Negative for hemoptysis, sputum production, shortness of breath and wheezing.   Cardiovascular:  Negative for chest pain, palpitations, orthopnea and leg swelling.  Gastrointestinal:  Negative for abdominal pain, blood in stool, constipation, diarrhea, heartburn, melena, nausea and  vomiting.  Genitourinary:  Negative for dysuria, frequency and urgency.  Musculoskeletal:  Positive for back pain and joint pain.  Skin: Negative.  Negative for itching and rash.  Neurological:  Negative for dizziness, tingling, focal weakness, weakness and headaches.  Endo/Heme/Allergies:  Does not bruise/bleed easily.  Psychiatric/Behavioral:  Negative for depression. The patient is not nervous/anxious and does not have insomnia.      MEDICAL HISTORY:  Past Medical History:  Diagnosis Date  . Allergic genetic state   . Arthritis   . Bladder spasms   . GERD (gastroesophageal reflux disease)   . HLD (hyperlipidemia)     SURGICAL HISTORY: Past Surgical History:  Procedure Laterality Date  . COLONOSCOPY    . COLONOSCOPY N/A 09/27/2021   Procedure: COLONOSCOPY;  Surgeon: Lesly Rubenstein, MD;  Location: [facility] ENDOSCOPY;  Service: Endoscopy;  Laterality: N/A;  . COLONOSCOPY WITH PROPOFOL N/A 03/26/2018   Procedure: COLONOSCOPY WITH PROPOFOL;  Surgeon: Lollie Sails, MD;  Location: [facility] ENDOSCOPY;  Service: Endoscopy;  Laterality: N/A;  . ESOPHAGOGASTRODUODENOSCOPY    . HERNIA REPAIR    . TONSILLECTOMY      SOCIAL HISTORY: Social History   Socioeconomic History  . Marital status: Married    Spouse name: Not on file  . Number of children: Not on file  . Years of education: Not on file  . Highest education level: Not on file  Occupational History  . Not on file  Tobacco Use  . Smoking status: Never  . Smokeless tobacco: Never  Vaping Use  . Vaping Use: Never used  Substance and Sexual Activity  . Alcohol use: Never  . Drug use: Never  . Sexual activity: Not on file  Other Topics  Concern  . Not on file  Social History Narrative  . Not on file   Social Determinants of Health   Financial Resource Strain: Not on file  Food Insecurity: Not on file  Transportation Needs: No Transportation Needs (12/31/2021)   PRAPARE - Transportation   . Lack of Transportation  (Medical): No   . Lack of Transportation (Non-Medical): No  Physical Activity: Not on file  Stress: Not on file  Social Connections: Not on file  Intimate Partner Violence: Not on file    FAMILY HISTORY: Family History  Problem Relation Age of Onset  . Diabetes Mother   . Obesity Mother   . Breast cancer Sister 55  . Healthy Brother   . Brain cancer Brother     ALLERGIES:  has No Known Allergies.  MEDICATIONS:  Current Outpatient Medications  Medication Sig Dispense Refill  . albuterol (VENTOLIN HFA) 108 (90 Base) MCG/ACT inhaler Inhale 2 puffs into the lungs every 6 (six) hours as needed for wheezing or shortness of breath. 8 g 0  . aspirin 81 MG EC tablet Take by mouth.    . calcium-vitamin D (OSCAL WITH D) 500-200 MG-UNIT tablet Take 1 tablet by mouth 2 (two) times daily.    . Cyanocobalamin 1000 MCG TBCR Take by mouth.    . folic acid (FOLVITE) 1 MG tablet Take by mouth.    . hydroxychloroquine (PLAQUENIL) 200 MG tablet Take 1 tablet by mouth 2 (two) times daily.    . meloxicam (MOBIC) 7.5 MG tablet Take by mouth.    . methotrexate (RHEUMATREX) 2.5 MG tablet Take by mouth.    . vitamin C (ASCORBIC ACID) 500 MG tablet Take 500 mg by mouth daily.    Marland Kitchen ALPRAZolam (XANAX) 0.5 MG tablet Take 0.5 mg by mouth every 8 (eight) hours as needed for anxiety. (Patient not taking: Reported on 12/31/2021)    . Budeson-Glycopyrrol-Formoterol (BREZTRI AEROSPHERE) 160-9-4.8 MCG/ACT AERO Inhale 2 puffs into the lungs 2 (two) times daily. (Patient not taking: Reported on 12/31/2021) 10.7 g 11  . cyclobenzaprine (FLEXERIL) 5 MG tablet Take one tab twice a day, ( may cause drowsiness) for 15 days (Patient not taking: Reported on 12/31/2021)    . lidocaine (LIDODERM) 5 % Place 1 patch onto the skin every 12 (twelve) hours. Remove & Discard patch within 12 hours or as directed by MD 10 patch 0  . OMEGA-3 FATTY ACIDS PO Take by mouth. (Patient not taking: Reported on 12/31/2021)    . pantoprazole (PROTONIX)  40 MG tablet Take 40 mg by mouth daily. (Patient not taking: Reported on 12/31/2021)     No current facility-administered medications for this visit.      Marland Kitchen  PHYSICAL EXAMINATION: ECOG PERFORMANCE STATUS: 1 - Symptomatic but completely ambulatory  Vitals:   12/31/21 1353  BP: (!) 142/62  Pulse: 81  Temp: 98.1 F (36.7 C)  SpO2: 95%   Filed Weights   12/31/21 1353  Weight: 173 lb 9.6 oz (78.7 kg)    Physical Exam Vitals and nursing note reviewed.  HENT:     Head: Normocephalic and atraumatic.     Mouth/Throat:     Pharynx: Oropharynx is clear.  Eyes:     Extraocular Movements: Extraocular movements intact.     Pupils: Pupils are equal, round, and reactive to light.  Cardiovascular:     Rate and Rhythm: Normal rate and regular rhythm.  Pulmonary:     Comments: Decreased breath sounds bilaterally.  Abdominal:  Palpations: Abdomen is soft.  Musculoskeletal:        General: Normal range of motion.     Cervical back: Normal range of motion.  Skin:    General: Skin is warm.  Neurological:     General: No focal deficit present.     Mental Status: She is alert and oriented to person, place, and time.  Psychiatric:        Behavior: Behavior normal.        Judgment: Judgment normal.     LABORATORY DATA:  I have reviewed the data as listed Lab Results  Component Value Date   WBC 7.3 11/11/2021   HGB 12.4 11/11/2021   HCT 37.4 11/11/2021   MCV 91.2 11/11/2021   PLT 206 11/11/2021   Recent Labs    11/11/21 0808  NA 137  K 3.9  CL 105  CO2 27  GLUCOSE 128*  BUN 15  CREATININE 0.65  CALCIUM 9.1  GFRNONAA >60    RADIOGRAPHIC STUDIES: I have personally reviewed the radiological images as listed and agreed with the findings in the report. NM PET Image Initial (PI) Skull Base To Thigh (F-18 FDG)  Result Date: 12/11/2021 CLINICAL DATA:  Initial treatment strategy for pulmonary nodule. EXAM: NUCLEAR MEDICINE PET SKULL BASE TO THIGH TECHNIQUE: 9.6 mCi F-18  FDG was injected intravenously. Full-ring PET imaging was performed from the skull base to thigh after the radiotracer. CT data was obtained and used for attenuation correction and anatomic localization. Fasting blood glucose: 113 mg/dl COMPARISON:  High-resolution chest CT 11/22/2021 FINDINGS: Mediastinal blood pool activity: SUV max 2.1 Liver activity: SUV max NA NECK: No hypermetabolic lymph nodes in the neck. Incidental CT findings: None. CHEST: 3.4 cm right upper lobe nodular opacity is not substantially changed since prior CT scan when it was measured at 3.1 cm. Lesion shows low level FDG accumulation with SUV max = 2.7. Posterior left lower lobe nodule is somewhat motion obscured on CT imaging but also appears not substantially changed at 2.4 cm today compared to 2.5 cm previously. SUV max = 1.7. Stable 11 mm irregular subsolid nodule posterior right upper lobe on image 71/2 without hypermetabolism on PET imaging. No hypermetabolic lymphadenopathy in the mediastinum or either hilum. Incidental CT findings: Mild atherosclerotic calcification is noted in the wall of the thoracic aorta. Mitral annular calcification evident. ABDOMEN/PELVIS: No abnormal hypermetabolic activity within the liver, pancreas, adrenal glands, or spleen. No hypermetabolic lymph nodes in the abdomen or pelvis. Incidental CT findings: 2 cm calcified gallstone evident. There is mild atherosclerotic calcification of the abdominal aorta without aneurysm. SKELETON: No focal hypermetabolic activity to suggest skeletal metastasis. Low level FDG uptake is identified with nonacute anterior right rib fractures. Incidental CT findings: None. IMPRESSION: 1. The nodular consolidative opacities seen previously in the right upper and lower lobes are similar in size in the 2-3 week history since prior CT. Both show low level FDG accumulation and may be infectious/inflammatory. As well differentiated or low-grade neoplasm can be poorly FDG avid, close  continued follow-up warranted. 2. Tiny 11 mm irregular subsolid nodule posterior right upper lobe shows no hypermetabolism on PET imaging. Continued attention on follow-up recommended. 3. No unexpected or suspicious hypermetabolic activity in the neck, chest, abdomen, or pelvis. 4. Nonacute anterior right rib fractures again noted. 5.  Aortic Atherosclerois (ICD10-170.0) Electronically Signed   By: Misty Stanley M.D.   On: 12/11/2021 12:29    ASSESSMENT & PLAN:   Mass of upper lobe of right lung #  Abnormal right upper and lower lobe consolidation/nodular lesions- AUG 2023-PET scan low-level DG activity noted.  No evidence of any hilar/mediastinal adenopathy.  Otherwise no evidence of any distant suspicious malignant activity noted.  Will review imaging at the tumor conference.  Discussed options would include biopsy versus follow-up imaging.  After discussion at the tumor conference will reach out to Dr. Humphrey Rolls for follow-up plan.  #Discussed with patient-both malignant/benign etiologies of above nodular lesions.  CT scan 2012-noted to have incidental/similar nodular lesions.   # Cough/COPD-on inhalers/recommend addition of antihistamines.  If not improved refer to Dr. Laurelyn Sickle for further recommendations.  Thank you Dr.Khan for allowing me to participate in the care of your pleasant patient. Please do not hesitate to contact me with questions or concerns in the interim.  Discussed with [facility].  # DISPOSITION: # no labs-  # follow up TBD- Dr.B  # I reviewed the blood work- with the patient in detail; also reviewed the imaging independently [as summarized above]; and with the patient in detail.   # 45 minutes face-to-face with the patient discussing the above plan of care; more than 50% of time spent on prognosis/ natural history; counseling and coordination.       All questions were answered. The patient knows to call the clinic with any problems, questions or concerns.       Cammie Sickle, MD 01/03/2022 9:14 PM

## 2022-01-03 NOTE — Addendum Note (Signed)
Addended by: Telford Nab on: 01/03/2022 10:45 AM   Modules accepted: Orders

## 2022-01-06 ENCOUNTER — Encounter: Payer: Self-pay | Admitting: *Deleted

## 2022-01-06 ENCOUNTER — Telehealth: Payer: Self-pay

## 2022-01-06 DIAGNOSIS — R918 Other nonspecific abnormal finding of lung field: Secondary | ICD-10-CM

## 2022-01-06 NOTE — Progress Notes (Signed)
Attempted to contact pt to review recommendations from case conference. Left message for pt to return call.   Per Dr. Jacinto Reap and Dr. Humphrey Rolls, pt needs bronchoscopy and Dr. Humphrey Rolls would like for Dr. Patsey Berthold to evaluate pt for bronchoscopy. Will discuss with patient and place a referral if in agreement.

## 2022-01-06 NOTE — Progress Notes (Signed)
Spoke with patient and reviewed recommendations from conference. Pt is in agreement with referral to Dr. Patsey Berthold to discuss bronchoscopy. Informed pt that she will follow up with Dr. B about 1 week after her bronchoscopy to discuss her results. All questions answered during call. Instructed pt to call with any further questions or needs. Pt verbalized understanding.

## 2022-01-06 NOTE — Telephone Encounter (Signed)
Received epic secure chat from Dr. Patsey Berthold.  Patient will need apt for possible bx. Lm for patient to offer 01/11/2022 at 9:00

## 2022-01-06 NOTE — Addendum Note (Signed)
Addended by: Telford Nab on: 01/06/2022 02:21 PM   Modules accepted: Orders

## 2022-01-07 NOTE — Telephone Encounter (Signed)
Lm for patient.  

## 2022-01-07 NOTE — Telephone Encounter (Signed)
Spoke to patient and scheduled appt 01/12/2022 at 10:00. Nothing further needed.

## 2022-01-12 ENCOUNTER — Telehealth: Payer: Self-pay | Admitting: *Deleted

## 2022-01-12 ENCOUNTER — Ambulatory Visit: Payer: Medicare HMO | Admitting: Student in an Organized Health Care Education/Training Program

## 2022-01-12 ENCOUNTER — Inpatient Hospital Stay: Payer: Medicare HMO

## 2022-01-12 ENCOUNTER — Encounter: Payer: Self-pay | Admitting: Student in an Organized Health Care Education/Training Program

## 2022-01-12 ENCOUNTER — Other Ambulatory Visit
Admission: RE | Admit: 2022-01-12 | Discharge: 2022-01-12 | Disposition: A | Discharge status: Home / Self Care | Payer: Medicare HMO | Source: Ambulatory Visit | Attending: Student in an Organized Health Care Education/Training Program | Admitting: Student in an Organized Health Care Education/Training Program

## 2022-01-12 VITALS — BP 130/80 | HR 95 | Temp 97.8°F | Ht 61.0 in | Wt 171.4 lb

## 2022-01-12 DIAGNOSIS — R918 Other nonspecific abnormal finding of lung field: Secondary | ICD-10-CM | POA: Diagnosis not present

## 2022-01-12 DIAGNOSIS — Z23 Encounter for immunization: Secondary | ICD-10-CM | POA: Diagnosis not present

## 2022-01-12 LAB — CBC WITH DIFFERENTIAL/PLATELET
Abs Immature Granulocytes: 0.02 10*3/uL (ref 0.00–0.07)
Basophils Absolute: 0.1 10*3/uL (ref 0.0–0.1)
Basophils Relative: 1 %
Eosinophils Absolute: 0.2 10*3/uL (ref 0.0–0.5)
Eosinophils Relative: 2 %
HCT: 39 % (ref 36.0–46.0)
Hemoglobin: 13 g/dL (ref 12.0–15.0)
Immature Granulocytes: 0 %
Lymphocytes Relative: 27 %
Lymphs Abs: 1.9 10*3/uL (ref 0.7–4.0)
MCH: 30.7 pg (ref 26.0–34.0)
MCHC: 33.3 g/dL (ref 30.0–36.0)
MCV: 92.2 fL (ref 80.0–100.0)
Monocytes Absolute: 0.5 10*3/uL (ref 0.1–1.0)
Monocytes Relative: 7 %
Neutro Abs: 4.5 10*3/uL (ref 1.7–7.7)
Neutrophils Relative %: 63 %
Platelets: 206 10*3/uL (ref 150–400)
RBC: 4.23 MIL/uL (ref 3.87–5.11)
RDW: 13.6 % (ref 11.5–15.5)
WBC: 7.1 10*3/uL (ref 4.0–10.5)
nRBC: 0 % (ref 0.0–0.2)

## 2022-01-12 LAB — BASIC METABOLIC PANEL
Anion gap: 7 (ref 5–15)
BUN: 24 mg/dL — ABNORMAL HIGH (ref 8–23)
CO2: 28 mmol/L (ref 22–32)
Calcium: 9.8 mg/dL (ref 8.9–10.3)
Chloride: 102 mmol/L (ref 98–111)
Creatinine, Ser: 0.71 mg/dL (ref 0.44–1.00)
GFR, Estimated: >60 mL/min (ref >60)
Glucose, Bld: 101 mg/dL — ABNORMAL HIGH (ref 70–99)
Potassium: 4.1 mmol/L (ref 3.5–5.1)
Sodium: 137 mmol/L (ref 135–145)

## 2022-01-12 LAB — PROTIME-INR
INR: 1 (ref 0.8–1.2)
Prothrombin Time: 13.2 seconds (ref 11.4–15.2)

## 2022-01-12 LAB — APTT: aPTT: 32 seconds (ref 24–36)

## 2022-01-12 NOTE — Progress Notes (Signed)
Synopsis: Referred in pulmonary mass by Juluis Pitch, MD  Assessment & Plan:   1. Pulmonary mass  Presents with both RUL and RLL masses that are mildly FDG avid and with associated lymphadenopathy. She is a never smoker and doesn't have any respiratory symptoms at the moment. She is maintained on Breztri for a diagnosis of COPD and follows Dr. Humphrey Rolls for pulmonary. Her ECOG score is 0.  The patient is here to discuss their imaging abnormalities which include the RUL and RLL masses as well as lymphadenopathy. The differential includes malignancy in addition to opportunistic infections (fungal & non-tuberculous mycobacteria) given her immune suppression with methotrexate. Organizing pneumonia is also on the differential.  We discussed the importance of diagnosis and staging in lung malignancies, and the approach to obtaining a tissue diagnosis which would include robotic assisted navigational bronchoscopy with endobronchial ultrasound guided sampling.  We also discussed the risks associated with the procedure which include a 2% risk of pneumothorax, infection, bleeding, and nondiagnostic procedure in detail.  I explained that patients typically are able to return home the same day of the procedure, but in rare cases admission to the hospital for observation and treatment is required.  After our discussion, the patient elected to proceed with the procedure. Following the procedure, she will follow up with her pulmonologist Dr. Humphrey Rolls and oncologist Dr. Rogue Bussing.  Recommendations:  - CT SUPER D CHEST WO MONARCH PILOT; Future - APTT; Future - Protime-INR; Future - CBC w/Diff; Future - Basic metabolic panel; Future - Robotic assisted navigational bronchoscopy with EBUS for lymph node sampling  2. Need for immunization against influenza  - Flu Vaccine QUAD High Dose(Fluad)  No follow-ups on file.  I spent 60 minutes caring for this patient today, including preparing to see the patient,  obtaining and/or reviewing separately obtained history, performing a medically appropriate examination and/or evaluation, counseling and educating the patient/family/caregiver, ordering medications, tests, or procedures, and documenting clinical information in the electronic health record  Armando Reichert, MD [facility] 01/12/2022 1:21 PM    End of visit medications:  No orders of the defined types were placed in this encounter.    Current Outpatient Medications:    albuterol (VENTOLIN HFA) 108 (90 Base) MCG/ACT inhaler, Inhale 2 puffs into the lungs every 6 (six) hours as needed for wheezing or shortness of breath., Disp: 8 g, Rfl: 0   aspirin 81 MG EC tablet, Take by mouth., Disp: , Rfl:    calcium-vitamin D (OSCAL WITH D) 500-200 MG-UNIT tablet, Take 1 tablet by mouth 2 (two) times daily., Disp: , Rfl:    Cyanocobalamin 1000 MCG TBCR, Take by mouth., Disp: , Rfl:    folic acid (FOLVITE) 1 MG tablet, Take by mouth., Disp: , Rfl:    hydroxychloroquine (PLAQUENIL) 200 MG tablet, Take 1 tablet by mouth 2 (two) times daily., Disp: , Rfl:    meloxicam (MOBIC) 7.5 MG tablet, Take by mouth., Disp: , Rfl:    methotrexate (RHEUMATREX) 2.5 MG tablet, Take by mouth., Disp: , Rfl:    Omega-3 Fatty Acids (FISH OIL) 1000 MG CAPS, Take 1 capsule by mouth daily., Disp: , Rfl:    vitamin C (ASCORBIC ACID) 500 MG tablet, Take 500 mg by mouth daily., Disp: , Rfl:    Budeson-Glycopyrrol-Formoterol (BREZTRI AEROSPHERE) 160-9-4.8 MCG/ACT AERO, Inhale 2 puffs into the lungs 2 (two) times daily. (Patient not taking: Reported on 12/31/2021), Disp: 10.7 g, Rfl: 11   Subjective:   PATIENT ID: Kim Bentley  GENDER: female DOB: Sep 09, 1940, MRN: [id number]  Chief Complaint  Patient presents with   Consult    Fall, had cxr and then PET scan.  Grown since last scan.  Here for bx.    HPI  Kim Bentley is a pleasant 81 year old female patient of Dr. Devona Konig who is presenting to clinic for the  evaluation of a pulmonary mass.  She has been followed by Dr. Humphrey Rolls and was incidentally found to have a pulmonary mass after presenting with shortness of breath following a fall. She underwent a CT scan of the chest for evaluation of her shortness of breath by Dr. Humphrey Rolls which was notable for a large rounded consolidation in the RUL and RLL. She then underwent a PET/CT showing low FDG avidity in both masses. The CT scan was also notable for lymphadenopathy (4R and 7 on my review). She was referred to oncology and saw Dr. Rogue Bussing and was subsequently presented in tumor board. She is being referred for consideration of bronchoscopy for biopsy.  She reports feeling well and has no symptoms at the moment. She has no shortness of breath, cough, chest pain, chest tightness, or wheezing. She is anxious given the consideration of malignancy in the differential diagnosis of her pulmonary mass.  She is a non-smoker. Patient medical history is notable for rheumatoid arthritis for which she is on methotrexate. She is also maintained on Breztri for her COPD.   Ancillary information including prior medications, full medical/surgical/family/social histories, and PFTs (when available) are listed below and have been reviewed.   Review of Systems  Constitutional:  Negative for chills, diaphoresis, fever, malaise/fatigue and weight loss.  Respiratory:  Negative for cough, hemoptysis, sputum production, shortness of breath and wheezing.   Cardiovascular:  Negative for chest pain.     Objective:   Vitals:   01/12/22 1018  BP: 130/80  Pulse: 95  Temp: 97.8 F (36.6 C)  TempSrc: Oral  SpO2: 94%  Weight: 171 lb 6.4 oz (77.7 kg)  Height: '5\' 1"'$  (1.549 m)   94% on RA  BMI Readings from Last 3 Encounters:  01/12/22 32.39 kg/m  12/31/21 32.80 kg/m  12/13/21 33.22 kg/m   Wt Readings from Last 3 Encounters:  01/12/22 171 lb 6.4 oz (77.7 kg)  12/31/21 173 lb 9.6 oz (78.7 kg)  12/13/21 175 lb 12.8 oz  (79.7 kg)    Physical Exam Vitals reviewed.  Constitutional:      Appearance: Normal appearance. She is obese.  HENT:     Nose: Nose normal.     Mouth/Throat:     Mouth: Mucous membranes are moist.  Eyes:     Pupils: Pupils are equal, round, and reactive to light.  Cardiovascular:     Rate and Rhythm: Normal rate and regular rhythm.     Pulses: Normal pulses.     Heart sounds: Normal heart sounds.  Pulmonary:     Effort: Pulmonary effort is normal.     Breath sounds: Normal breath sounds.  Abdominal:     General: There is distension.     Palpations: Abdomen is soft.  Musculoskeletal:     Cervical back: Normal range of motion.  Skin:    General: Skin is warm.  Neurological:     General: No focal deficit present.     Mental Status: She is alert and oriented to person, place, and time. Mental status is at baseline.       Ancillary Information    Past Medical History:  Diagnosis Date   Allergic genetic state    Arthritis    Bladder spasms    GERD (gastroesophageal reflux disease)    HLD (hyperlipidemia)      Family History  Problem Relation Age of Onset   Diabetes Mother    Obesity Mother    Breast cancer Sister 64   Healthy Brother    Brain cancer Brother      Past Surgical History:  Procedure Laterality Date   COLONOSCOPY     COLONOSCOPY N/A 09/27/2021   Procedure: COLONOSCOPY;  Surgeon: Lesly Rubenstein, MD;  Location: [facility] ENDOSCOPY;  Service: Endoscopy;  Laterality: N/A;   COLONOSCOPY WITH PROPOFOL N/A 03/26/2018   Procedure: COLONOSCOPY WITH PROPOFOL;  Surgeon: Lollie Sails, MD;  Location: [facility] ENDOSCOPY;  Service: Endoscopy;  Laterality: N/A;   ESOPHAGOGASTRODUODENOSCOPY     HERNIA REPAIR     TONSILLECTOMY      Social History   Socioeconomic History   Marital status: Married    Spouse name: Not on file   Number of children: Not on file   Years of education: Not on file   Highest education level: Not on file  Occupational History    Not on file  Tobacco Use   Smoking status: Never    Passive exposure: Past   Smokeless tobacco: Never  Vaping Use   Vaping Use: Never used  Substance and Sexual Activity   Alcohol use: Never   Drug use: Never   Sexual activity: Not on file  Other Topics Concern   Not on file  Social History Narrative   Not on file   Social Determinants of Health   Financial Resource Strain: Not on file  Food Insecurity: Not on file  Transportation Needs: No Transportation Needs (12/31/2021)   PRAPARE - Transportation    Lack of Transportation (Medical): No    Lack of Transportation (Non-Medical): No  Physical Activity: Not on file  Stress: Not on file  Social Connections: Not on file  Intimate Partner Violence: Not on file     No Known Allergies   CBC    Component Value Date/Time   WBC 7.1 01/12/2022 1235   RBC 4.23 01/12/2022 1235   HGB 13.0 01/12/2022 1235   HGB 12.4 10/27/2011 0217   HCT 39.0 01/12/2022 1235   HCT 36.8 10/27/2011 0217   PLT 206 01/12/2022 1235   PLT 182 10/27/2011 0217   MCV 92.2 01/12/2022 1235   MCV 91 10/27/2011 0217   MCH 30.7 01/12/2022 1235   MCHC 33.3 01/12/2022 1235   RDW 13.6 01/12/2022 1235   RDW 12.9 10/27/2011 0217   LYMPHSABS 1.9 01/12/2022 1235   LYMPHSABS 2.3 10/27/2011 0217   MONOABS 0.5 01/12/2022 1235   MONOABS 0.7 10/27/2011 0217   EOSABS 0.2 01/12/2022 1235   EOSABS 0.2 10/27/2011 0217   BASOSABS 0.1 01/12/2022 1235   BASOSABS 0.0 10/27/2011 0217    Pulmonary Functions Testing Results:     No data to display          Outpatient Medications Prior to Visit  Medication Sig Dispense Refill   albuterol (VENTOLIN HFA) 108 (90 Base) MCG/ACT inhaler Inhale 2 puffs into the lungs every 6 (six) hours as needed for wheezing or shortness of breath. 8 g 0   aspirin 81 MG EC tablet Take by mouth.     calcium-vitamin D (OSCAL WITH D) 500-200 MG-UNIT tablet Take 1 tablet by mouth 2 (two) times daily.     Cyanocobalamin 1000 MCG  TBCR Take by  mouth.     folic acid (FOLVITE) 1 MG tablet Take by mouth.     hydroxychloroquine (PLAQUENIL) 200 MG tablet Take 1 tablet by mouth 2 (two) times daily.     meloxicam (MOBIC) 7.5 MG tablet Take by mouth.     methotrexate (RHEUMATREX) 2.5 MG tablet Take by mouth.     Omega-3 Fatty Acids (FISH OIL) 1000 MG CAPS Take 1 capsule by mouth daily.     vitamin C (ASCORBIC ACID) 500 MG tablet Take 500 mg by mouth daily.     Budeson-Glycopyrrol-Formoterol (BREZTRI AEROSPHERE) 160-9-4.8 MCG/ACT AERO Inhale 2 puffs into the lungs 2 (two) times daily. (Patient not taking: Reported on 12/31/2021) 10.7 g 11   ALPRAZolam (XANAX) 0.5 MG tablet Take 0.5 mg by mouth every 8 (eight) hours as needed for anxiety. (Patient not taking: Reported on 12/31/2021)     cyclobenzaprine (FLEXERIL) 5 MG tablet Take one tab twice a day, ( may cause drowsiness) for 15 days (Patient not taking: Reported on 12/31/2021)     lidocaine (LIDODERM) 5 % Place 1 patch onto the skin every 12 (twelve) hours. Remove & Discard patch within 12 hours or as directed by MD (Patient not taking: Reported on 01/12/2022) 10 patch 0   OMEGA-3 FATTY ACIDS PO Take by mouth. (Patient not taking: Reported on 01/12/2022)     pantoprazole (PROTONIX) 40 MG tablet Take 40 mg by mouth daily. (Patient not taking: Reported on 01/12/2022)     No facility-administered medications prior to visit.

## 2022-01-12 NOTE — Telephone Encounter (Signed)
For the code [phone] Prior Auth Not Required Refer # [id number]

## 2022-01-12 NOTE — Patient Instructions (Signed)
Today, we discussed the mass in your lung. You will need a procedure to biopsy the mass in your lung. We will attempt to have it scheduled for next week.

## 2022-01-12 NOTE — Telephone Encounter (Signed)
Called preop and spoke with Robin to schedule pre admit testing and covid testing.  Preop call scheduled for 9/25 at 1 pm. And the Covid testing is scheduled on 9/27 at 10:15 am.  Called and spoke with patient, gave her the pre procedure information.  She verbalized understanding.  She is going now to get her lab work done.  Nothing further needed.

## 2022-01-12 NOTE — Telephone Encounter (Signed)
01/21/2022 8/30 am Robotic with [facility] CTP code:  29021 Diagnosis code:  R91.8

## 2022-01-13 ENCOUNTER — Telehealth: Payer: Self-pay

## 2022-01-13 NOTE — Telephone Encounter (Signed)
Appt has been canceled.  Patient is aware and voiced her understanding.  Nothing further needed.

## 2022-01-13 NOTE — Telephone Encounter (Signed)
Per Dr. Genia Harold verbally-- 02/11/22 appt can be canceled, as patient follows with Dr. Humphrey Rolls.   Lm for patient to make her aware.

## 2022-01-17 ENCOUNTER — Encounter
Admission: RE | Admit: 2022-01-17 | Discharge: 2022-01-17 | Disposition: A | Discharge status: Home / Self Care | Payer: Medicare HMO | Source: Ambulatory Visit | Attending: Student in an Organized Health Care Education/Training Program | Admitting: Student in an Organized Health Care Education/Training Program

## 2022-01-17 ENCOUNTER — Other Ambulatory Visit: Payer: Self-pay

## 2022-01-17 HISTORY — DX: Pneumonia, unspecified organism: J18.9

## 2022-01-17 HISTORY — DX: Personal history of urinary calculi: Z87.442

## 2022-01-17 HISTORY — DX: Chronic obstructive pulmonary disease, unspecified: J44.9

## 2022-01-17 HISTORY — DX: Dyspnea, unspecified: R06.00

## 2022-01-17 NOTE — Patient Instructions (Addendum)
Your procedure is scheduled on: 01/21/22 - Friday Report to the Registration Desk on the 1st floor of the [facility]. To find out your arrival time, please call [phone] between 1PM - 3PM on: 01/20/22 - Thursday If your arrival time is 6:00 am, do not arrive prior to that time as the [facility] entrance doors do not open until 6:00 am.  REMEMBER: Instructions that are not followed completely may result in serious medical risk, up to and including death; or upon the discretion of your surgeon and anesthesiologist your surgery may need to be rescheduled.  Do not eat food or drink any fluids after midnight the night before surgery.  No gum chewing, lozengers or hard candies.  TAKE THESE MEDICATIONS THE MORNING OF SURGERY WITH A SIP OF WATER:  - hydroxychloroquine (PLAQUENIL)  - fluticasone-salmeterol (ADVAIR)   Use inhaler albuterol (VENTOLIN ) on the day of surgery and bring to the hospital.  One week prior to surgery: Stop Anti-inflammatories (NSAIDS) such as Advil, Aleve, Ibuprofen, Motrin, Naproxen, Naprosyn and Aspirin based products such as Excedrin, Goodys Powder, BC Powder.  Stop ANY OVER THE COUNTER supplements until after surgery.calcium-vitamin D (OSCAL WITH D) 937, folic acid (FOLVITE), Omega-3 Fatty Acids,vitamin C (ASCORBIC).   You may however, continue to take Tylenol if needed for pain up until the day of surgery.  No Alcohol for 24 hours before or after surgery.  No Smoking including e-cigarettes for 24 hours prior to surgery.  No chewable tobacco products for at least 6 hours prior to surgery.  No nicotine patches on the day of surgery.  Do not use any "recreational" drugs for at least a week prior to your surgery.  Please be advised that the combination of cocaine and anesthesia may have negative outcomes, up to and including death. If you test positive for cocaine, your surgery will be cancelled.  On the morning of surgery brush your teeth with toothpaste  and water, you may rinse your mouth with mouthwash if you wish. Do not swallow any toothpaste or mouthwash.  Do not wear jewelry, make-up, hairpins, clips or nail polish.  Do not wear lotions, powders, or perfumes.   Do not shave body from the neck down 48 hours prior to surgery just in case you cut yourself which could leave a site for infection.  Also, freshly shaved skin may become irritated if using the CHG soap.  Contact lenses, hearing aids and dentures may not be worn into surgery.  Do not bring valuables to the hospital. [facility] is not responsible for any missing/lost belongings or valuables.   Notify your doctor if there is any change in your medical condition (cold, fever, infection).  Wear comfortable clothing (specific to your surgery type) to the hospital.  After surgery, you can help prevent lung complications by doing breathing exercises.  Take deep breaths and cough every 1-2 hours. Your doctor may order a device called an Incentive Spirometer to help you take deep breaths. When coughing or sneezing, hold a pillow firmly against your incision with both hands. This is called "splinting." Doing this helps protect your incision. It also decreases belly discomfort.  If you are being admitted to the hospital overnight, leave your suitcase in the car. After surgery it may be brought to your room.  If you are being discharged the day of surgery, you will not be allowed to drive home. You will need a responsible adult (18 years or older) to drive you home and stay with  you that night.   If you are taking public transportation, you will need to have a responsible adult (18 years or older) with you. Please confirm with your physician that it is acceptable to use public transportation.   Please call the [facility] at [phone] if you have any questions about these instructions.  Surgery Visitation Policy:  Patients undergoing a surgery or procedure  may have two family members or support persons with them as long as the person is not COVID-19 positive or experiencing its symptoms.   Inpatient Visitation:    Visiting hours are 7 a.m. to 8 p.m. Up to four visitors are allowed at one time in a patient room, including children. The visitors may rotate out with other people during the day. One designated support person (adult) may remain overnight.

## 2022-01-18 ENCOUNTER — Telehealth: Payer: Self-pay | Admitting: Student in an Organized Health Care Education/Training Program

## 2022-01-18 NOTE — Telephone Encounter (Signed)
Spoke to patient.  She is questioning if her metal dental implant with affect CT. She is also questioning if she can have dental surgery on 01/24/2022 or if that is too soon after bronch on 01/21/2022.  Dr. Genia Harold, please advise. Thanks

## 2022-01-18 NOTE — Telephone Encounter (Signed)
Patient is aware of below message and voiced her understanding.  Nothing further needed.   

## 2022-01-19 ENCOUNTER — Ambulatory Visit
Admission: RE | Admit: 2022-01-19 | Discharge: 2022-01-19 | Disposition: A | Discharge status: Home / Self Care | Payer: Medicare HMO | Source: Ambulatory Visit | Attending: Student in an Organized Health Care Education/Training Program | Admitting: Student in an Organized Health Care Education/Training Program

## 2022-01-19 ENCOUNTER — Encounter
Admission: RE | Admit: 2022-01-19 | Discharge: 2022-01-19 | Disposition: A | Discharge status: Home / Self Care | Payer: Medicare HMO | Source: Ambulatory Visit | Attending: Student in an Organized Health Care Education/Training Program | Admitting: Student in an Organized Health Care Education/Training Program

## 2022-01-19 DIAGNOSIS — Z1152 Encounter for screening for COVID-19: Secondary | ICD-10-CM | POA: Insufficient documentation

## 2022-01-19 DIAGNOSIS — R918 Other nonspecific abnormal finding of lung field: Secondary | ICD-10-CM | POA: Diagnosis not present

## 2022-01-19 DIAGNOSIS — Z20822 Contact with and (suspected) exposure to covid-19: Secondary | ICD-10-CM

## 2022-01-20 DIAGNOSIS — Z79899 Other long term (current) drug therapy: Secondary | ICD-10-CM | POA: Diagnosis not present

## 2022-01-20 DIAGNOSIS — M0579 Rheumatoid arthritis with rheumatoid factor of multiple sites without organ or systems involvement: Secondary | ICD-10-CM | POA: Diagnosis not present

## 2022-01-20 DIAGNOSIS — R911 Solitary pulmonary nodule: Secondary | ICD-10-CM | POA: Diagnosis not present

## 2022-01-20 LAB — SARS CORONAVIRUS 2 (TAT 6-24 HRS): SARS Coronavirus 2: NEGATIVE

## 2022-01-20 MED ORDER — LACTATED RINGERS IV SOLN: INTRAVENOUS | Status: DC

## 2022-01-20 MED ORDER — ORAL CARE MOUTH RINSE: 15.0000 mL | Freq: Once | OROMUCOSAL | Status: AC

## 2022-01-20 MED ORDER — FAMOTIDINE 20 MG PO TABS
20.0000 mg | ORAL_TABLET | Freq: Once | ORAL | Status: AC
  Administered 2022-01-21: 20 mg via ORAL

## 2022-01-20 MED ORDER — CHLORHEXIDINE GLUCONATE 0.12 % MT SOLN: 15.0000 mL | Freq: Once | OROMUCOSAL | Status: AC

## 2022-01-20 MED ORDER — SODIUM CHLORIDE 0.9 % IV SOLN: Freq: Once | INTRAVENOUS | Status: DC

## 2022-01-21 ENCOUNTER — Ambulatory Visit: Payer: Medicare HMO

## 2022-01-21 ENCOUNTER — Ambulatory Visit: Payer: Medicare HMO | Admitting: Anesthesiology

## 2022-01-21 ENCOUNTER — Ambulatory Visit
Admission: RE | Admit: 2022-01-21 | Discharge: 2022-01-21 | Disposition: A | Discharge status: Home / Self Care | Payer: Medicare HMO | Attending: Student in an Organized Health Care Education/Training Program | Admitting: Student in an Organized Health Care Education/Training Program

## 2022-01-21 ENCOUNTER — Encounter
Admission: RE | Disposition: A | Discharge status: Home / Self Care | Payer: Self-pay | Source: Home / Self Care | Attending: Student in an Organized Health Care Education/Training Program

## 2022-01-21 ENCOUNTER — Encounter: Payer: Self-pay | Admitting: Student in an Organized Health Care Education/Training Program

## 2022-01-21 DIAGNOSIS — R918 Other nonspecific abnormal finding of lung field: Secondary | ICD-10-CM

## 2022-01-21 DIAGNOSIS — E785 Hyperlipidemia, unspecified: Secondary | ICD-10-CM | POA: Insufficient documentation

## 2022-01-21 DIAGNOSIS — B9689 Other specified bacterial agents as the cause of diseases classified elsewhere: Secondary | ICD-10-CM | POA: Diagnosis not present

## 2022-01-21 DIAGNOSIS — M069 Rheumatoid arthritis, unspecified: Secondary | ICD-10-CM | POA: Diagnosis not present

## 2022-01-21 DIAGNOSIS — K219 Gastro-esophageal reflux disease without esophagitis: Secondary | ICD-10-CM | POA: Diagnosis not present

## 2022-01-21 DIAGNOSIS — R062 Wheezing: Secondary | ICD-10-CM | POA: Insufficient documentation

## 2022-01-21 DIAGNOSIS — E669 Obesity, unspecified: Secondary | ICD-10-CM | POA: Insufficient documentation

## 2022-01-21 DIAGNOSIS — J449 Chronic obstructive pulmonary disease, unspecified: Secondary | ICD-10-CM | POA: Diagnosis not present

## 2022-01-21 DIAGNOSIS — R590 Localized enlarged lymph nodes: Secondary | ICD-10-CM | POA: Diagnosis not present

## 2022-01-21 DIAGNOSIS — Z6832 Body mass index (BMI) 32.0-32.9, adult: Secondary | ICD-10-CM | POA: Insufficient documentation

## 2022-01-21 DIAGNOSIS — R0602 Shortness of breath: Secondary | ICD-10-CM | POA: Insufficient documentation

## 2022-01-21 DIAGNOSIS — D381 Neoplasm of uncertain behavior of trachea, bronchus and lung: Secondary | ICD-10-CM | POA: Diagnosis not present

## 2022-01-21 HISTORY — DX: Other nonspecific abnormal finding of lung field: R91.8

## 2022-01-21 SURGERY — BRONCHOSCOPY, WITH BIOPSY USING ELECTROMAGNETIC NAVIGATION
Anesthesia: General | Laterality: Right

## 2022-01-21 MED ORDER — SUGAMMADEX SODIUM 200 MG/2ML IV SOLN
INTRAVENOUS | Status: DC | PRN
  Administered 2022-01-21: 200 mg via INTRAVENOUS

## 2022-01-21 MED ORDER — OXYCODONE HCL 5 MG/5ML PO SOLN: 5.0000 mg | Freq: Once | ORAL | Status: DC | PRN

## 2022-01-21 MED ORDER — SUCCINYLCHOLINE CHLORIDE 200 MG/10ML IV SOSY
PREFILLED_SYRINGE | INTRAVENOUS | Status: DC | PRN
  Administered 2022-01-21: 100 mg via INTRAVENOUS

## 2022-01-21 MED ORDER — FAMOTIDINE 20 MG PO TABS
ORAL_TABLET | ORAL | Status: AC
  Filled 2022-01-21: qty 1

## 2022-01-21 MED ORDER — FENTANYL CITRATE (PF) 100 MCG/2ML IJ SOLN
INTRAMUSCULAR | Status: DC | PRN
  Administered 2022-01-21: 50 ug via INTRAVENOUS

## 2022-01-21 MED ORDER — PROPOFOL 10 MG/ML IV BOLUS
INTRAVENOUS | Status: AC
  Filled 2022-01-21: qty 20

## 2022-01-21 MED ORDER — OXYCODONE HCL 5 MG PO TABS: 5.0000 mg | ORAL_TABLET | Freq: Once | ORAL | Status: DC | PRN

## 2022-01-21 MED ORDER — ONDANSETRON HCL 4 MG/2ML IJ SOLN
INTRAMUSCULAR | Status: DC | PRN
  Administered 2022-01-21: 4 mg via INTRAVENOUS

## 2022-01-21 MED ORDER — FENTANYL CITRATE (PF) 100 MCG/2ML IJ SOLN: 25.0000 ug | INTRAMUSCULAR | Status: DC | PRN

## 2022-01-21 MED ORDER — DROPERIDOL 2.5 MG/ML IJ SOLN: 0.6250 mg | Freq: Once | INTRAMUSCULAR | Status: DC | PRN

## 2022-01-21 MED ORDER — IPRATROPIUM-ALBUTEROL 0.5-2.5 (3) MG/3ML IN SOLN
3.0000 mL | RESPIRATORY_TRACT | Status: DC | PRN
  Administered 2022-01-21: 3 mL via RESPIRATORY_TRACT

## 2022-01-21 MED ORDER — PHENYLEPHRINE 80 MCG/ML (10ML) SYRINGE FOR IV PUSH (FOR BLOOD PRESSURE SUPPORT)
PREFILLED_SYRINGE | INTRAVENOUS | Status: DC | PRN
  Administered 2022-01-21 (×2): 80 ug via INTRAVENOUS

## 2022-01-21 MED ORDER — ACETAMINOPHEN 10 MG/ML IV SOLN: 1000.0000 mg | Freq: Once | INTRAVENOUS | Status: DC | PRN

## 2022-01-21 MED ORDER — PROPOFOL 1000 MG/100ML IV EMUL
INTRAVENOUS | Status: AC
  Filled 2022-01-21: qty 100

## 2022-01-21 MED ORDER — PROPOFOL 500 MG/50ML IV EMUL
INTRAVENOUS | Status: DC | PRN
  Administered 2022-01-21: 120 ug/kg/min via INTRAVENOUS

## 2022-01-21 MED ORDER — IPRATROPIUM-ALBUTEROL 0.5-2.5 (3) MG/3ML IN SOLN
RESPIRATORY_TRACT | Status: AC
  Filled 2022-01-21: qty 3

## 2022-01-21 MED ORDER — ROCURONIUM BROMIDE 100 MG/10ML IV SOLN
INTRAVENOUS | Status: DC | PRN
  Administered 2022-01-21: 10 mg via INTRAVENOUS
  Administered 2022-01-21 (×2): 30 mg via INTRAVENOUS

## 2022-01-21 MED ORDER — FENTANYL CITRATE (PF) 100 MCG/2ML IJ SOLN
INTRAMUSCULAR | Status: AC
  Filled 2022-01-21: qty 2

## 2022-01-21 MED ORDER — LIDOCAINE HCL (CARDIAC) PF 100 MG/5ML IV SOSY
PREFILLED_SYRINGE | INTRAVENOUS | Status: DC | PRN
  Administered 2022-01-21: 60 mg via INTRAVENOUS

## 2022-01-21 MED ORDER — PROMETHAZINE HCL 25 MG/ML IJ SOLN: 6.2500 mg | INTRAMUSCULAR | Status: DC | PRN

## 2022-01-21 MED ORDER — PROPOFOL 10 MG/ML IV BOLUS
INTRAVENOUS | Status: DC | PRN
  Administered 2022-01-21: 100 mg via INTRAVENOUS

## 2022-01-21 MED ORDER — DEXAMETHASONE SODIUM PHOSPHATE 10 MG/ML IJ SOLN
INTRAMUSCULAR | Status: DC | PRN
  Administered 2022-01-21: 10 mg via INTRAVENOUS

## 2022-01-21 MED ORDER — ROCURONIUM BROMIDE 10 MG/ML (PF) SYRINGE
PREFILLED_SYRINGE | INTRAVENOUS | Status: AC
  Filled 2022-01-21: qty 10

## 2022-01-21 MED ORDER — PHENYLEPHRINE 80 MCG/ML (10ML) SYRINGE FOR IV PUSH (FOR BLOOD PRESSURE SUPPORT)
PREFILLED_SYRINGE | INTRAVENOUS | Status: AC
  Filled 2022-01-21: qty 10

## 2022-01-21 MED ORDER — LIDOCAINE HCL (PF) 2 % IJ SOLN
INTRAMUSCULAR | Status: AC
  Filled 2022-01-21: qty 5

## 2022-01-21 MED ORDER — CHLORHEXIDINE GLUCONATE 0.12 % MT SOLN
OROMUCOSAL | Status: AC
  Administered 2022-01-21: 15 mL via OROMUCOSAL
  Filled 2022-01-21: qty 15

## 2022-01-21 NOTE — H&P (Signed)
NAME:  GENEVA BARRERO, MRN:  [id number], DOB:  November 15, 1940, LOS: 0 ADMISSION DATE:  01/21/2022, Procedure DATE:  01/21/2022 REFERRING MD:  Devona Konig, MD CHIEF COMPLAINT:  Pulmonary nodule   History of Present Illness:   Ms. Venti is a 81 year old female patient of Dr. Devona Konig who is presenting for the evaluation of a pulmonary mass.   She has been followed by Dr. Humphrey Rolls and was incidentally found to have a pulmonary mass after presenting with shortness of breath following a fall. She underwent a CT scan of the chest for evaluation of her shortness of breath by which was notable for a large rounded consolidation in the RUL and RLL. She then underwent a PET/CT showing low FDG avidity in both masses. The CT scan was also notable for lymphadenopathy (4R and 7). She was referred to oncology and saw Dr. Rogue Bussing and was subsequently presented in tumor board. She was referred for evaluation for robotic assisted navigational bronchoscopy.  Objective   Blood pressure (!) 150/73, pulse 88, temperature 97.6 F (36.4 C), temperature source Temporal, resp. rate 14, height '5\' 1"'$  (1.549 m), weight 77.1 kg, SpO2 96 %.       No intake or output data in the 24 hours ending 01/21/22 0757 Filed Weights   01/21/22 0707  Weight: 77.1 kg    Examination: Physical Exam Vitals reviewed.  Constitutional:      Appearance: Normal appearance. She is obese.  HENT:     Nose: Nose normal.     Mouth/Throat:     Mouth: Mucous membranes are moist.  Eyes:     Pupils: Pupils are equal, round, and reactive to light.  Cardiovascular:     Rate and Rhythm: Normal rate and regular rhythm.     Pulses: Normal pulses.     Heart sounds: Normal heart sounds.  Pulmonary:     Effort: Pulmonary effort is normal.     Breath sounds: Wheezing (scattered wheezing) present.  Abdominal:     General: There is distension.     Palpations: Abdomen is soft.  Musculoskeletal:     Cervical back: Normal range of motion.  Skin:     General: Skin is warm.  Neurological:     General: No focal deficit present.     Mental Status: She is alert and oriented to person, place, and time. Mental status is at baseline.     Assessment & Plan:   #Pulmonary Mass  Presented with both RUL and RLL masses that are mildly FDG avid with associated lymphadenopathy. The differential includes malignancy, infection, organizing pneumonia, or other inflammatory condition. Patient will undergo robotic assisted navigational bronchoscopy with EBUS for sampling of masses and lymphadenopathy. She is appropriate for the procedure this morning.   Labs   CBC: No results for input(s): "WBC", "NEUTROABS", "HGB", "HCT", "MCV", "PLT" in the last 168 hours.  Basic Metabolic Panel: No results for input(s): "NA", "K", "CL", "CO2", "GLUCOSE", "BUN", "CREATININE", "CALCIUM", "MG", "PHOS" in the last 168 hours. GFR: Estimated Creatinine Clearance: 53.6 mL/min (by C-G formula based on SCr of 0.71 mg/dL). No results for input(s): "PROCALCITON", "WBC", "LATICACIDVEN" in the last 168 hours.  Liver Function Tests: No results for input(s): "AST", "ALT", "ALKPHOS", "BILITOT", "PROT", "ALBUMIN" in the last 168 hours. No results for input(s): "LIPASE", "AMYLASE" in the last 168 hours. No results for input(s): "AMMONIA" in the last 168 hours.  ABG No results found for: "PHART", "PCO2ART", "PO2ART", "HCO3", "TCO2", "ACIDBASEDEF", "O2SAT"   Coagulation Profile:  No results for input(s): "INR", "PROTIME" in the last 168 hours.  Cardiac Enzymes: No results for input(s): "CKTOTAL", "CKMB", "CKMBINDEX", "TROPONINI" in the last 168 hours.  HbA1C: No results found for: "HGBA1C"  CBG: No results for input(s): "GLUCAP" in the last 168 hours.   Past Medical History:  She,  has a past medical history of Allergic genetic state, Arthritis, Bladder spasms, COPD (chronic obstructive pulmonary disease) ([place]), Dyspnea, GERD (gastroesophageal reflux disease), History of  kidney stones, HLD (hyperlipidemia), Pneumonia, and Pulmonary mass.   Surgical History:   Past Surgical History:  Procedure Laterality Date   COLONOSCOPY     COLONOSCOPY N/A 09/27/2021   Procedure: COLONOSCOPY;  Surgeon: Lesly Rubenstein, MD;  Location: [facility] ENDOSCOPY;  Service: Endoscopy;  Laterality: N/A;   COLONOSCOPY WITH PROPOFOL N/A 03/26/2018   Procedure: COLONOSCOPY WITH PROPOFOL;  Surgeon: Lollie Sails, MD;  Location: [facility] ENDOSCOPY;  Service: Endoscopy;  Laterality: N/A;   ESOPHAGOGASTRODUODENOSCOPY     HERNIA REPAIR     TONSILLECTOMY       Social History:   reports that she has never smoked. She has been exposed to tobacco smoke. She has never used smokeless tobacco. She reports that she does not drink alcohol and does not use drugs.   Family History:  Her family history includes Brain cancer in her brother; Breast cancer (age of onset: 18) in her sister; Diabetes in her mother; Healthy in her brother; Obesity in her mother.   Allergies No Known Allergies   Home Medications  Prior to Admission medications   Medication Sig Start Date End Date Taking? Authorizing Provider  acetaminophen (TYLENOL) 500 MG tablet Take 1,000 mg by mouth every 8 (eight) hours as needed.   Yes [provider]  albuterol (VENTOLIN HFA) 108 (90 Base) MCG/ACT inhaler Inhale 2 puffs into the lungs every 6 (six) hours as needed for wheezing or shortness of breath. 11/11/21  Yes Blake Divine, MD  aspirin 81 MG EC tablet Take by mouth.   Yes [provider]  calcium-vitamin D (OSCAL WITH D) 500-200 MG-UNIT tablet Take 1 tablet by mouth daily with breakfast.   Yes [provider]  Cyanocobalamin 1000 MCG TBCR Take by mouth.   Yes [provider]  fluticasone-salmeterol (ADVAIR) 250-50 MCG/ACT AEPB Inhale 1 puff into the lungs daily.   Yes [provider]  folic acid (FOLVITE) 1 MG tablet Take by mouth. 09/02/21 09/02/22 Yes [provider]   hydroxychloroquine (PLAQUENIL) 200 MG tablet Take 1 tablet by mouth 2 (two) times daily. 10/15/20  Yes [provider]  meloxicam (MOBIC) 7.5 MG tablet Take by mouth as needed. 12/06/20  Yes [provider]  methotrexate (RHEUMATREX) 2.5 MG tablet Take by mouth. 5 tablets 10/15/20  Yes [provider]  Omega-3 Fatty Acids (FISH OIL) 1000 MG CAPS Take 1 capsule by mouth daily.   Yes [provider]  vitamin C (ASCORBIC ACID) 500 MG tablet Take 500 mg by mouth daily.   Yes [provider]  Budeson-Glycopyrrol-Formoterol (BREZTRI AEROSPHERE) 160-9-4.8 MCG/ACT AERO Inhale 2 puffs into the lungs 2 (two) times daily. Patient not taking: Reported on 12/31/2021 12/13/21   Allyne Gee, MD     Armando Reichert, MD [facility] 01/21/2022 8:01 AM

## 2022-01-21 NOTE — Op Note (Signed)
Robotic Assisted Navigational Bronchoscopy   Indication: RLL and RUL mass, lymphadenopathy  Consent: Verbal/Written: obtained  Surgeon: Armando Reichert, MD Assistant: Renold Don, MD  Type of Anesthesia: General endotracheal  Procedures Performed:   Robotic bronchoscopy: Procedure consists of robotic navigation comprised of electromagnetics, optical pattern recognition and robotic kinematic data - to triangulate bronchoscope location during the procedure and provide accurate positional data to biopsy a lesion. Augmented fluoroscopy with Body Vision.  Procedure:   A History and Physical has been performed.  The patient's medications, allergies, and sensitivities have been reviewed. Please see anesthesia pre-procedural documentation in EPIC.    The patient had General Anesthesia administered by anesthesia staff.  There was an independent monitor for sedation present throughout the duration of the procedure.  The full record of the vitals, medicines, and patient demographics can be viewed on the anesthesia monitoring report.  The patient was brought to Procedure Room 2 in the OR area. After obtaining informed consent, the patient was first intubated by anesthesia staff using a #8.5 ET tube. Appropriate timeout was then taken. The tube was secured at 4 cm above the carina.  A Portex adapter was placed on the ET tube flange.  Once the patient was under adequate general anesthesia the Olympus therapeutic video bronchoscope was advanced and an airway survey was performed. Once the survey bronchoscopy was completed, registration for the augmented fluoroscopy (Body Vision) was then performed with the fluoroscopic C arm. Preoperative planning had been performed on the [employer] plan point laptop. Once this was completed, the robotic bronchoscope ET tube adapter was placed and ETT was cut to proper length and secured in mid plane. The [facility] robotic scope was then advanced through the ETT and  registration was performed successfully. Once registration was complete, we were satisfied with our virtual and live views matching up, we began by navigating to our target lesions (RLL then RUL). There was fair correlation between the robotic mapping and bronchoscopic mapping in the RLL and poor correlation in the right upper lobe. With the assistance of fused navigation, the bronchoscope was advanced to the RLL mass.  Positioning was confirmed with augmented fluoroscopy. Augmented fluoroscopy via Body Vision was utilized to optimize the position most favorable for biopsies, then the robotic bronchoscope was anchored to maintain position. Findings and specimen collections discussed below. Following sampling at the RLL, we attempted to navigate to the RUL. Given the degree of CT to body divergence, we did not obtain samples from the RUL. Robotic bronchoscope part of the procedure was then concluded.  The robotic catheter was removed and the arm was undocked. Linear EBUS bronchoscope was then used to perform invasive mediastinal staging.  Following this flexible bronchoscopy was again performed for therapeutic aspiration.  The patient tolerated the procedure well, was awoken from general anesthesia, and transferred to PACU in stable condition.  Findings:  The endotracheal tube is in good position.  The visualized portion of the trachea is of normal caliber.  The carina is sharp. The tracheobronchial tree was examined to at least the first subsegmental level.  Bronchial Mucosa and anatomy are normal; there are no endobronchial lesions and no secretions.    After flexible bronchoscopy we transitioned to robotic bronchoscopy with [facility] navigational guidance (registration and planning as above). We performed navigational bronchoscopy to the planned lesion which was in the RLL.  Sampling was performed under live fluoroscopy via C-arm as follows:  Transbronchial needle aspirations of RLL nodule were performed  using a fine 21 gauge)  needle and sent for routine cytology.  3 samples were obtained.  One sample was sent for culture.  Transbronchial biopsies of RLL nodule were performed using forceps and sent for histopathology examination. 5 samples were obtained.  Linear endobronchial ultrasound was then performed to multiple mediastinal stations at 11R superior, 7 and 4R. Real time EBUS guided transbronchial needle aspirations(21 gauge Olympus needle) at the following lymph node stations: 4 passes at station 11R superior, 4 passes at station 7, and 4 passes at station 4R. TBNA samples were sent for routine cytology.     Fluoroscopy: Augmented fluoroscopy (Body Vision) was utilized during the course of this procedure to assure that biopsies were taken in a safe manner under fluoroscopic guidance with spot films required.    Assessment and Plan/Additional Comments: CXR post procedure Await pathology report Await culture report from RLL mass Follow up with Dr. Devona Konig in clinic   Armando Reichert, MD [facility]

## 2022-01-21 NOTE — Discharge Instructions (Addendum)
AMBULATORY SURGERY  ?DISCHARGE INSTRUCTIONS ? ? ?The drugs that you were given will stay in your system until tomorrow so for the next 24 hours you should not: ? ?Drive an automobile ?Make any legal decisions ?Drink any alcoholic beverage ? ? ?You may resume regular meals tomorrow.  Today it is better to start with liquids and gradually work up to solid foods. ? ?You may eat anything you prefer, but it is better to start with liquids, then soup and crackers, and gradually work up to solid foods. ? ? ?Please notify your doctor immediately if you have any unusual bleeding, trouble breathing, redness and pain at the surgery site, drainage, fever, or pain not relieved by medication. ? ? ? ?Additional Instructions: ? ? ? ?Please contact your physician with any problems or Same Day Surgery at [phone], Monday through Friday 6 am to 4 pm, or [facility] Main number at [phone].  ?

## 2022-01-21 NOTE — Anesthesia Postprocedure Evaluation (Signed)
Anesthesia Post Note  Patient: Kim Bentley  Procedure(s) Performed: ROBOTIC ASSISTED NAVIGATIONAL BRONCHOSCOPY (Right)  Patient location during evaluation: PACU Anesthesia Type: General Level of consciousness: awake and alert Pain management: pain level controlled Vital Signs Assessment: post-procedure vital signs reviewed and stable Respiratory status: spontaneous breathing, nonlabored ventilation and respiratory function stable Cardiovascular status: blood pressure returned to baseline and stable Postop Assessment: no apparent nausea or vomiting Anesthetic complications: no   No notable events documented.   Last Vitals:  Vitals:   01/21/22 1215 01/21/22 1238  BP: (!) 143/61 (!) 147/60  Pulse: 84 89  Resp: 20 18  Temp: 36.4 C 36.8 C  SpO2: 94% 93%    Last Pain:  Vitals:   01/21/22 1238  TempSrc: Temporal  PainSc: 0-No pain                 [place] Ouch

## 2022-01-21 NOTE — Transfer of Care (Signed)
Immediate Anesthesia Transfer of Care Note  Patient: Kim Bentley  Procedure(s) Performed: ROBOTIC ASSISTED NAVIGATIONAL BRONCHOSCOPY (Right)  Patient Location: PACU  Anesthesia Type:General  Level of Consciousness: awake, drowsy and patient cooperative  Airway & Oxygen Therapy: Patient Spontanous Breathing and Patient connected to face mask oxygen  Post-op Assessment: Report given to RN and Post -op Vital signs reviewed and stable  Post vital signs: Reviewed and stable  Last Vitals:  Vitals Value Taken Time  BP 141/76 01/21/22 1056  Temp 36.9 C 01/21/22 1056  Pulse 87 01/21/22 1100  Resp 24 01/21/22 1100  SpO2 99 % 01/21/22 1100  Vitals shown include unvalidated device data.  Last Pain:  Vitals:   01/21/22 0707  TempSrc: Temporal  PainSc: 0-No pain         Complications: No notable events documented.

## 2022-01-21 NOTE — Anesthesia Preprocedure Evaluation (Addendum)
Anesthesia Evaluation  Patient identified by MRN, date of birth, ID band Patient awake    Reviewed: Allergy & Precautions, H&P , NPO status , Patient's Chart, lab work & pertinent test results, reviewed documented beta blocker date and time   Airway Mallampati: IV   Neck ROM: full    Dental no notable dental hx.    Pulmonary shortness of breath, COPD,  COPD inhaler,  Mass of upper lobe of right lung    + wheezing      Cardiovascular Exercise Tolerance: Good negative cardio ROS Normal cardiovascular exam Rhythm:regular Rate:Normal     Neuro/Psych negative neurological ROS  negative psych ROS   GI/Hepatic Neg liver ROS, GERD  Medicated,  Endo/Other  negative endocrine ROS  Renal/GU negative Renal ROS  negative genitourinary   Musculoskeletal  (+) Arthritis , Rheumatoid disorders,    Abdominal (+) + obese,   Peds  Hematology negative hematology ROS (+)   Anesthesia Other Findings Past Medical History: No date: Allergic genetic state No date: Arthritis No date: Bladder spasms No date: GERD (gastroesophageal reflux disease) No date: HLD (hyperlipidemia) Past Surgical History: No date: COLONOSCOPY 03/26/2018: COLONOSCOPY WITH PROPOFOL; N/A     Comment:  Procedure: COLONOSCOPY WITH PROPOFOL;  Surgeon:               Lollie Sails, MD;  Location: [facility] ENDOSCOPY;                Service: Endoscopy;  Laterality: N/A; No date: ESOPHAGOGASTRODUODENOSCOPY No date: HERNIA REPAIR No date: TONSILLECTOMY BMI    Body Mass Index: 29.66 kg/m     Reproductive/Obstetrics negative OB ROS                            Anesthesia Physical  Anesthesia Plan  ASA: 3  Anesthesia Plan: General   Post-op Pain Management: Minimal or no pain anticipated   Induction: Intravenous  PONV Risk Score and Plan: Ondansetron, Dexamethasone and Treatment may vary due to age or medical condition  Airway  Management Planned: Oral ETT  Additional Equipment:   Intra-op Plan:   Post-operative Plan: Extubation in OR  Informed Consent: I have reviewed the patients History and Physical, chart, labs and discussed the procedure including the risks, benefits and alternatives for the proposed anesthesia with the patient or authorized representative who has indicated his/her understanding and acceptance.     Dental Advisory Given  Plan Discussed with: CRNA and Anesthesiologist  Anesthesia Plan Comments: (Pt used albuterol this morning)       Anesthesia Quick Evaluation

## 2022-01-21 NOTE — Anesthesia Procedure Notes (Signed)
Procedure Name: Intubation Date/Time: 01/21/2022 8:39 AM  Performed by: Jerrye Noble, CRNAPre-anesthesia Checklist: Patient identified, Emergency Drugs available, Suction available and Patient being monitored Patient Re-evaluated:Patient Re-evaluated prior to induction Oxygen Delivery Method: Circle system utilized Preoxygenation: Pre-oxygenation with 100% oxygen Induction Type: IV induction Ventilation: Mask ventilation without difficulty Laryngoscope Size: McGraph and 3 Grade View: Grade I Tube type: Oral Tube size: 8.5 mm Number of attempts: 1 Airway Equipment and Method: Stylet and Oral airway Placement Confirmation: ETT inserted through vocal cords under direct vision, positive ETCO2 and breath sounds checked- equal and bilateral Secured at: 19 cm Tube secured with: Tape Dental Injury: Teeth and Oropharynx as per pre-operative assessment

## 2022-01-24 LAB — CYTOLOGY - NON PAP

## 2022-01-24 LAB — SURGICAL PATHOLOGY

## 2022-01-25 LAB — CULTURE, RESPIRATORY W GRAM STAIN: Gram Stain: NONE SEEN

## 2022-01-28 ENCOUNTER — Inpatient Hospital Stay: Payer: Medicare HMO | Attending: Internal Medicine | Admitting: Internal Medicine

## 2022-01-28 ENCOUNTER — Encounter: Payer: Self-pay | Admitting: Internal Medicine

## 2022-01-28 DIAGNOSIS — J449 Chronic obstructive pulmonary disease, unspecified: Secondary | ICD-10-CM | POA: Insufficient documentation

## 2022-01-28 DIAGNOSIS — R918 Other nonspecific abnormal finding of lung field: Secondary | ICD-10-CM | POA: Diagnosis not present

## 2022-01-28 NOTE — Progress Notes (Signed)
Occasional SOBr.

## 2022-01-28 NOTE — Assessment & Plan Note (Addendum)
#   Abnormal right upper and lower lobe consolidation/nodular lesions- AUG 2023-PET scan low-level FDG activity noted.  No evidence of any hilar/mediastinal adenopathy.  Otherwise no evidence of any distant suspicious malignant activity noted. SEP 29th- [Dr.Dgayli]- ENB-negative for malignancy.  Will discuss at tumor conference. Rn navigator reach to pt after the conference.  # ?  Bronchoscopy cultures positive for RALSTONIA PICKETTII-reached out to pulmonary regarding need for treatment. ?Cough-Will refer to pulmonary for further recommendations.   # DISPOSITION: # follow up as needed-Dr.B.

## 2022-01-28 NOTE — Progress Notes (Signed)
[facility] NOTE  Patient Care Team: Juluis Pitch, MD as PCP - General (Family Medicine) Telford Nab, RN as Oncology Nurse Navigator  CHIEF COMPLAINTS/PURPOSE OF CONSULTATION: lung nodule/mass  # IMPRESSION: 1. The nodular consolidative opacities seen previously in the right upper and lower lobes are similar in size in the 2-3 week history since prior CT. Both show low level FDG accumulation and may be infectious/inflammatory. As well differentiated or low-grade neoplasm can be poorly FDG avid, close continued follow-up warranted. 2. Tiny 11 mm irregular subsolid nodule posterior right upper lobe shows no hypermetabolism on PET imaging. Continued attention on follow-up recommended. 3. No unexpected or suspicious hypermetabolic activity in the neck, chest, abdomen, or pelvis. 4. Nonacute anterior right rib fractures again noted. 5.  Aortic Atherosclerois (ICD10-170.0)  AUG 2023-[CT scan-incidental status post fall; Dr.Khan ]PET scan low-level FDG activity noted.  No evidence of any hilar/mediastinal adenopathy.  Otherwise no evidence of any distant suspicious malignant activity noted. SEP 29th- [Dr.Dgayli]- ENB-negative for malignancy.  ? 2012-nodules on CT scan.    Oncology History   No history exists.     HISTORY OF PRESENTING ILLNESS: Alone.  Ambulating independently.  Kim Bentley 81 y.o.  female NO history of smoking I-and lung mass right lower and upper lobes is here to review the results of the biopsy.  Patient continues to have chronic mild cough.  Denies any worsening shortness of breath.  No fever no chills.  Review of Systems  Constitutional:  Positive for malaise/fatigue. Negative for chills, diaphoresis, fever and weight loss.  HENT:  Negative for nosebleeds and sore throat.   Eyes:  Negative for double vision.  Respiratory:  Positive for cough. Negative for hemoptysis, sputum production, shortness of breath and wheezing.    Cardiovascular:  Negative for chest pain, palpitations, orthopnea and leg swelling.  Gastrointestinal:  Negative for abdominal pain, blood in stool, constipation, diarrhea, heartburn, melena, nausea and vomiting.  Genitourinary:  Negative for dysuria, frequency and urgency.  Musculoskeletal:  Positive for back pain and joint pain.  Skin: Negative.  Negative for itching and rash.  Neurological:  Negative for dizziness, tingling, focal weakness, weakness and headaches.  Endo/Heme/Allergies:  Does not bruise/bleed easily.  Psychiatric/Behavioral:  Negative for depression. The patient is not nervous/anxious and does not have insomnia.      MEDICAL HISTORY:  Past Medical History:  Diagnosis Date   Allergic genetic state    Arthritis    Bladder spasms    COPD (chronic obstructive pulmonary disease) (HCC)    mild   Dyspnea    GERD (gastroesophageal reflux disease)    History of kidney stones    HLD (hyperlipidemia)    Pneumonia    Pulmonary mass     SURGICAL HISTORY: Past Surgical History:  Procedure Laterality Date   COLONOSCOPY     COLONOSCOPY N/A 09/27/2021   Procedure: COLONOSCOPY;  Surgeon: Lesly Rubenstein, MD;  Location: [facility] ENDOSCOPY;  Service: Endoscopy;  Laterality: N/A;   COLONOSCOPY WITH PROPOFOL N/A 03/26/2018   Procedure: COLONOSCOPY WITH PROPOFOL;  Surgeon: Lollie Sails, MD;  Location: [facility] ENDOSCOPY;  Service: Endoscopy;  Laterality: N/A;   ESOPHAGOGASTRODUODENOSCOPY     HERNIA REPAIR     TONSILLECTOMY      SOCIAL HISTORY: Social History   Socioeconomic History   Marital status: Widowed    Spouse name: Not on file   Number of children: Not on file   Years of education: Not on file   Highest education  level: Not on file  Occupational History   Not on file  Tobacco Use   Smoking status: Never    Passive exposure: Past   Smokeless tobacco: Never  Vaping Use   Vaping Use: Never used  Substance and Sexual Activity   Alcohol use: Never   Drug use:  Never   Sexual activity: Not on file  Other Topics Concern   Not on file  Social History Narrative   Lives alone, grandchild lives with her   Social Determinants of Health   Financial Resource Strain: Not on file  Food Insecurity: Not on file  Transportation Needs: No Transportation Needs (12/31/2021)   PRAPARE - Hydrologist (Medical): No    Lack of Transportation (Non-Medical): No  Physical Activity: Not on file  Stress: Not on file  Social Connections: Not on file  Intimate Partner Violence: Not on file    FAMILY HISTORY: Family History  Problem Relation Age of Onset   Diabetes Mother    Obesity Mother    Breast cancer Sister 38   Healthy Brother    Brain cancer Brother     ALLERGIES:  has No Known Allergies.  MEDICATIONS:  Current Outpatient Medications  Medication Sig Dispense Refill   acetaminophen (TYLENOL) 500 MG tablet Take 1,000 mg by mouth every 8 (eight) hours as needed.     albuterol (VENTOLIN HFA) 108 (90 Base) MCG/ACT inhaler Inhale 2 puffs into the lungs every 6 (six) hours as needed for wheezing or shortness of breath. 8 g 0   aspirin 81 MG EC tablet Take by mouth.     calcium-vitamin D (OSCAL WITH D) 500-200 MG-UNIT tablet Take 1 tablet by mouth daily with breakfast.     Cyanocobalamin 1000 MCG TBCR Take by mouth.     fluticasone-salmeterol (ADVAIR) 250-50 MCG/ACT AEPB Inhale 1 puff into the lungs daily.     folic acid (FOLVITE) 1 MG tablet Take by mouth.     hydroxychloroquine (PLAQUENIL) 200 MG tablet Take 1 tablet by mouth 2 (two) times daily.     meloxicam (MOBIC) 7.5 MG tablet Take by mouth as needed.     methotrexate (RHEUMATREX) 2.5 MG tablet Take by mouth. 5 tablets     Omega-3 Fatty Acids (FISH OIL) 1000 MG CAPS Take 1 capsule by mouth daily.     vitamin C (ASCORBIC ACID) 500 MG tablet Take 500 mg by mouth daily.     Budeson-Glycopyrrol-Formoterol (BREZTRI AEROSPHERE) 160-9-4.8 MCG/ACT AERO Inhale 2 puffs into the  lungs 2 (two) times daily. (Patient not taking: Reported on 12/31/2021) 10.7 g 11   No current facility-administered medications for this visit.      Marland Kitchen  PHYSICAL EXAMINATION: ECOG PERFORMANCE STATUS: 1 - Symptomatic but completely ambulatory  Vitals:   01/28/22 1000  BP: (!) 150/85  Pulse: 85  Resp: 18  Temp: 98.9 F (37.2 C)  SpO2: 96%    Filed Weights   01/28/22 1000  Weight: 171 lb 6.4 oz (77.7 kg)     Physical Exam Vitals and nursing note reviewed.  HENT:     Head: Normocephalic and atraumatic.     Mouth/Throat:     Pharynx: Oropharynx is clear.  Eyes:     Extraocular Movements: Extraocular movements intact.     Pupils: Pupils are equal, round, and reactive to light.  Cardiovascular:     Rate and Rhythm: Normal rate and regular rhythm.  Pulmonary:     Comments: Decreased breath sounds bilaterally.  Abdominal:     Palpations: Abdomen is soft.  Musculoskeletal:        General: Normal range of motion.     Cervical back: Normal range of motion.  Skin:    General: Skin is warm.  Neurological:     General: No focal deficit present.     Mental Status: She is alert and oriented to person, place, and time.  Psychiatric:        Behavior: Behavior normal.        Judgment: Judgment normal.      LABORATORY DATA:  I have reviewed the data as listed Lab Results  Component Value Date   WBC 7.1 01/12/2022   HGB 13.0 01/12/2022   HCT 39.0 01/12/2022   MCV 92.2 01/12/2022   PLT 206 01/12/2022   Recent Labs    11/11/21 0808 01/12/22 1235  NA 137 137  K 3.9 4.1  CL 105 102  CO2 27 28  GLUCOSE 128* 101*  BUN 15 24*  CREATININE 0.65 0.71  CALCIUM 9.1 9.8  GFRNONAA >60 >60    RADIOGRAPHIC STUDIES: I have personally reviewed the radiological images as listed and agreed with the findings in the report. DG Chest Port 1 View  Result Date: 01/21/2022 CLINICAL DATA:  Postprocedure EXAM: PORTABLE CHEST 1 VIEW COMPARISON:  None Available. FINDINGS: Persistent  RIGHT upper lobe mass. Normal cardiac silhouette. No pneumothorax. No pulmonary edema. Chronic elevation of the RIGHT hemidiaphragm. RIGHT lobe nodule seen on CT not appreciated. IMPRESSION: 1. No postprocedural complication. 2. Persistent RIGHT upper lobe mass. Electronically Signed   By: Suzy Bouchard M.D.   On: 01/21/2022 11:08   DG C-Arm 1-60 Min-No Report  Result Date: 01/21/2022 Fluoroscopy was utilized by the requesting physician.  No radiographic interpretation.   CT SUPER D CHEST WO MONARCH PILOT  Result Date: 01/20/2022 CLINICAL DATA:  Pre bronchoscopy planning CT. Pulmonary nodule identified on PET-CT scan. * Tracking Code: BO * EXAM: CT CHEST WITHOUT CONTRAST TECHNIQUE: Multidetector CT imaging of the chest was performed using thin slice collimation for electromagnetic bronchoscopy planning purposes, without intravenous contrast. RADIATION DOSE REDUCTION: This exam was performed according to the departmental dose-optimization program which includes automated exposure control, adjustment of the mA and/or kV according to patient size and/or use of iterative reconstruction technique. COMPARISON:  PET-CT 12/09/2021 FINDINGS: Cardiovascular: Annular mitral valvular calcification. Mediastinum/Nodes: No axillary or supraclavicular adenopathy. Mildly enlarged RIGHT lower paratracheal node measures 12 mm. No metabolic activity on comparison PET-CT scan. Lungs/Pleura: RIGHT upper lobe mass measures 3.6 x 2.2 cm unchanged from comparison PET-CT scan. Rounded focus of interstitial thickening in the posterior segment of the RIGHT upper lobe (image 42/3) measures 7 mm unchanged. Rounded nodule at the RIGHT lung base measuring 2.3 cm is unchanged. Upper Abdomen: Limited view of the liver, kidneys, pancreas are unremarkable. Normal adrenal glands. Gallstone noted Musculoskeletal: No aggressive osseous lesion. IMPRESSION: 1. Persistent RIGHT upper lobe mass. No change from recent PET-CT scan. 2. Persistent  round RIGHT lobe nodule unchanged from recent PET-CT scan. Electronically Signed   By: Suzy Bouchard M.D.   On: 01/20/2022 11:40    ASSESSMENT & PLAN:   Right lower lobe lung mass # Abnormal right upper and lower lobe consolidation/nodular lesions- AUG 2023-PET scan low-level FDG activity noted.  No evidence of any hilar/mediastinal adenopathy.  Otherwise no evidence of any distant suspicious malignant activity noted. SEP 29th- [Dr.Dgayli]- ENB-negative for malignancy.  Will discuss at tumor conference. Rn navigator reach to pt after the  conference.  # ?  Bronchoscopy cultures positive for RALSTONIA PICKETTII-reached out to pulmonary regarding need for treatment. ?Cough-Will refer to pulmonary for further recommendations.   # DISPOSITION: # follow up as needed-Dr.B.      All questions were answered. The patient knows to call the clinic with any problems, questions or concerns.       Cammie Sickle, MD 01/28/2022 10:40 AM

## 2022-02-01 ENCOUNTER — Telehealth: Payer: Self-pay | Admitting: Internal Medicine

## 2022-02-01 NOTE — Telephone Encounter (Signed)
Per patient's request, MR faxed to [facility] [phone]

## 2022-02-03 ENCOUNTER — Telehealth: Payer: Self-pay | Admitting: Student in an Organized Health Care Education/Training Program

## 2022-02-03 ENCOUNTER — Other Ambulatory Visit: Payer: Medicare HMO

## 2022-02-03 MED ORDER — PREDNISONE 10 MG PO TABS: 10.0000 mg | ORAL_TABLET | Freq: Every day | ORAL | 0 refills | Status: DC

## 2022-02-03 NOTE — Telephone Encounter (Signed)
Called and spoke to patient.  She has a scheduled appointment for 02/25/2022. She is questioning if she should be seen sooner due to persistent sx.  C/o non prod cough, SOB with exertion and talking and wheezing. Sx have been present for months. Denied f/c/s or additional sx.  Neg covid test 01/20/2022.  Dr. Mortimer Fries, please advise. Dr. Genia Harold is unavailable.

## 2022-02-03 NOTE — Telephone Encounter (Addendum)
Prednisone sent to preferred pharmacy.  Patient is aware of recommendations and voiced her understanding.  Nothing further needed.

## 2022-02-03 NOTE — Progress Notes (Signed)
Tumor Board Documentation  ACSA ESTEY was presented by Dr Rogue Bussing at our Tumor Board on 02/03/2022, which included representatives from medical oncology, pathology, radiology, research, navigation, internal medicine, pharmacy, surgical, radiation oncology, pulmonology, genetics.  Albena currently presents as a new patient, for [place], for new positive pathology with history of the following treatments: surgical intervention(s).  Additionally, we reviewed previous medical and familial history, history of present illness, and recent lab results along with all available histopathologic and imaging studies. The tumor board considered available treatment options and made the following recommendations: Active surveillance Pulmonology to fu with surveillance  The following procedures/referrals were also placed: No orders of the defined types were placed in this encounter.   Clinical Trial Status: not discussed   Staging used:  NA AJCC Staging:       Group: Lung mass  National site-specific guidelines   were discussed with respect to the case.  Tumor board is a meeting of clinicians from various specialty areas who evaluate and discuss patients for whom a multidisciplinary approach is being considered. Final determinations in the plan of care are those of the provider(s). The responsibility for follow up of recommendations given during tumor board is that of the provider.   Today's extended care, comprehensive team conference, Gisell was not present for the discussion and was not examined.   Multidisciplinary Tumor Board is a multidisciplinary case peer review process.  Decisions discussed in the Multidisciplinary Tumor Board reflect the opinions of the specialists present at the conference without having examined the patient.  Ultimately, treatment and diagnostic decisions rest with the primary provider(s) and the patient.

## 2022-02-04 ENCOUNTER — Telehealth: Payer: Self-pay | Admitting: *Deleted

## 2022-02-04 ENCOUNTER — Telehealth: Payer: Self-pay | Admitting: Internal Medicine

## 2022-02-04 NOTE — Telephone Encounter (Signed)
Discussed with ID, with regards to atypical infection noted in the chest/bronchoscopy biopsy.  Defer to pulmonary, Dr.Dygali. Lung navigator to inform patient of the plan.  GB

## 2022-02-04 NOTE — Telephone Encounter (Signed)
-----   Message from Cammie Sickle, MD sent at 01/28/2022 10:13 AM EDT ----- Regarding: cultures Hi Dr.Dgyali-any thoughts on the recent cultures from her bronchoscopy -RARE RALSTONIA PICKETTII?   Negative for malignancy.  Will discuss at tumor conference.   For now we will turn over the patient's surveillance/follow-up with Dr. Humphrey Rolls.   Abigail Teall-please follow-up with patient after the tumor conference.  Thanks GB

## 2022-02-04 NOTE — Telephone Encounter (Signed)
Reviewed recommendations from case conference with patient. Informed that she will be called with recommendations from infectious disease once received. Instructed to keep follow up appt with Dr. Genia Harold as scheduled and does not require any further follow up at the cancer center. Pt verbalized understanding. Nothing further needed at this time.

## 2022-02-08 ENCOUNTER — Ambulatory Visit: Payer: Medicare HMO | Admitting: Student in an Organized Health Care Education/Training Program

## 2022-02-08 ENCOUNTER — Encounter: Payer: Self-pay | Admitting: Student in an Organized Health Care Education/Training Program

## 2022-02-08 VITALS — BP 138/76 | HR 92 | Temp 97.8°F | Ht 61.0 in | Wt 173.0 lb

## 2022-02-08 DIAGNOSIS — R918 Other nonspecific abnormal finding of lung field: Secondary | ICD-10-CM | POA: Diagnosis not present

## 2022-02-08 DIAGNOSIS — R0602 Shortness of breath: Secondary | ICD-10-CM

## 2022-02-08 DIAGNOSIS — J4489 Other specified chronic obstructive pulmonary disease: Secondary | ICD-10-CM | POA: Diagnosis not present

## 2022-02-08 MED ORDER — TRELEGY ELLIPTA 100-62.5-25 MCG/ACT IN AEPB: 1.0000 | INHALATION_SPRAY | Freq: Every day | RESPIRATORY_TRACT | 11 refills | Status: DC

## 2022-02-08 MED ORDER — CIPROFLOXACIN HCL 750 MG PO TABS: 750.0000 mg | ORAL_TABLET | Freq: Two times a day (BID) | ORAL | 0 refills | Status: AC

## 2022-02-08 NOTE — Progress Notes (Signed)
Synopsis: Follow up on pulmonary masses as well as shortness of breath.  Assessment & Plan:   1. Pulmonary mass  Presents with both RUL and RLL masses that are mildly FDG avid and with associated lymphadenopathy. She underwent robotic assisted navigational bronchoscopy to the RLL mass, while the RUL mass was not biopsied given difficulty navigating to it. She also underwent EBUS of 3 stations. Samples from the RLL mass had show mild atypia with increased N:C ratio, but there was no finding of malignancy on the transbronchial biopsy or the FNA. Furthermore, FNA from EBUS at enlarged stations 7, 4R and 11R superior was also negative for malignancy with adequate tissue sampled.  The differential includes malignancy (diagnostic uncertainty remains given inability to sample the RUL and possibility of malignancy hiding within the RLL) in addition to infections (bacteria, fungal & non-tuberculous mycobacteria) given her immune suppression with methotrexate.   We discussed of nondiagnostic procedure in detail at the preprocedural visit as well as today. I explained that there still remains a possibility that the nodules might contain malignancy. That said, given that culture has grown [place], and the patient's relative immune suppression with methotrexate, I will favor treating with Ciprofloxacin for 7 days, and re-assessing with a repeat CT scan in 6 weeks.  - ciprofloxacin (CIPRO) 750 MG tablet; Take 1 tablet (750 mg total) by mouth 2 (two) times daily for 7 days.  Dispense: 14 tablet; Refill: 0 - CT CHEST WO CONTRAST; Future  #Shortness of breath  Patient has a history of obstruction on PFT's from 12/13/2021 with scooping of the expiratory flow volume loop. While her ratio is preserved (73%), she does have an impaired FEV1 of 1.19 at 68% predicted. 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 FVC is decreased and so is TLC, suggesting mild restriction. DLCO  was within normal. CT high resolution on 11/23/2021 did not show any signs of ILD. 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 I will prescribe her Trelegy and re-assess symptoms on follow up.  - Fluticasone-Umeclidin-Vilant (TRELEGY ELLIPTA) 100-62.5-25 MCG/ACT AEPB; Inhale 1 puff into the lungs daily.  Dispense: 1 each; Refill: 11   Return in about 6 weeks (around 03/22/2022).  I spent 30 minutes caring for this patient today, including preparing to see the patient, obtaining and/or reviewing separately obtained history, performing a medically appropriate examination and/or evaluation, counseling and educating the patient/family/caregiver, ordering medications, tests, or procedures, documenting clinical information in the electronic health record, and independently interpreting results (not separately reported/billed) and communicating results to the patient/family/caregiver  Armando Reichert, MD [facility] 02/08/2022 6:10 PM    End of visit medications:  Meds ordered this encounter  Medications   ciprofloxacin (CIPRO) 750 MG tablet    Sig: Take 1 tablet (750 mg total) by mouth 2 (two) times daily for 7 days.    Dispense:  14 tablet    Refill:  0   Fluticasone-Umeclidin-Vilant (TRELEGY ELLIPTA) 100-62.5-25 MCG/ACT AEPB    Sig: Inhale 1 puff into the lungs daily.    Dispense:  1 each    Refill:  11     Current Outpatient Medications:    acetaminophen (TYLENOL) 500 MG tablet, Take 1,000 mg by mouth every 8 (eight) hours as needed., Disp: , Rfl:    albuterol (VENTOLIN HFA) 108 (90 Base) MCG/ACT inhaler, Inhale 2 puffs into the lungs every 6 (six) hours as needed for wheezing or shortness of breath., Disp: 8 g,  Rfl: 0   aspirin 81 MG EC tablet, Take by mouth., Disp: , Rfl:    calcium-vitamin D (OSCAL WITH D) 500-200 MG-UNIT tablet, Take 1 tablet by mouth daily with breakfast., Disp: , Rfl:    ciprofloxacin (CIPRO) 750 MG tablet, Take 1 tablet  (750 mg total) by mouth 2 (two) times daily for 7 days., Disp: 14 tablet, Rfl: 0   Cyanocobalamin 1000 MCG TBCR, Take by mouth., Disp: , Rfl:    Fluticasone-Umeclidin-Vilant (TRELEGY ELLIPTA) 100-62.5-25 MCG/ACT AEPB, Inhale 1 puff into the lungs daily., Disp: 1 each, Rfl: 11   folic acid (FOLVITE) 1 MG tablet, Take by mouth., Disp: , Rfl:    hydroxychloroquine (PLAQUENIL) 200 MG tablet, Take 1 tablet by mouth 2 (two) times daily., Disp: , Rfl:    meloxicam (MOBIC) 7.5 MG tablet, Take by mouth as needed., Disp: , Rfl:    methotrexate (RHEUMATREX) 2.5 MG tablet, Take by mouth. 5 tablets, Disp: , Rfl:    Omega-3 Fatty Acids (FISH OIL) 1000 MG CAPS, Take 1 capsule by mouth daily., Disp: , Rfl:    predniSONE (DELTASONE) 10 MG tablet, Take 1 tablet (10 mg total) by mouth daily with breakfast., Disp: 7 tablet, Rfl: 0   vitamin C (ASCORBIC ACID) 500 MG tablet, Take 500 mg by mouth daily., Disp: , Rfl:    Subjective:   PATIENT ID: Kim Bentley GENDER: female DOB: [birth year], MRN: [id number]  Chief Complaint  Patient presents with   Follow-up    Chest congestion, prod cough with white mucus and wheezing.     HPI  Kim Bentley is a pleasant 81 year old female patient who is presenting to clinic for follow up on her pulmonary mass after her recent biopsy.    She was incidentally found to have a pulmonary mass after presenting with shortness of breath following a fall. She underwent a CT scan of the chest for evaluation of her shortness of breath by Dr. Humphrey Rolls which was notable for a large rounded consolidation in the RUL and RLL. She then underwent a PET/CT showing low FDG avidity in both masses. The CT scan was also notable for lymphadenopathy (4R and 7 on my review). She was referred to oncology and saw Dr. Rogue Bussing and was subsequently presented in tumor board and was subsequently referred for biopsy.  She underwent robotic assisted navigational bronchoscopy on 01/21/2022 to the RLL mass, in addition  EBUS to multiple stations. The results from the biopsy has returned benign, with cultures growing Ralstonia Picketti. Similarly, result from the EBUS has been negative for malignancy.   She reports feeling well and has no symptoms at the moment. She had some shortness of breath and cough a week ago and called our office. She was prescribed a short course of prednisone and she has since felt significantly better. She currently cough, chest pain, chest tightness, or wheezing. She is anxious given the consideration of malignancy in the differential diagnosis of her pulmonary mass.   She is a non-smoker. Patient medical history is notable for rheumatoid arthritis for which she is on methotrexate. She was supposed to be on Breztri but reports that it did not help much with her symptoms.  Ancillary information including prior medications, full medical/surgical/family/social histories, and PFTs (when available) are listed below and have been reviewed.   Review of Systems  Constitutional:  Negative for chills and fever.  Respiratory:  Positive for cough and shortness of breath. Negative for hemoptysis and sputum production.   Cardiovascular:  Negative for chest pain.  Skin:  Negative for rash.   Pathology Reports 01/21/2022  SURGICAL PATHOLOGY   CASE: [id number]  Surgical Pathology Report   DIAGNOSIS:  A. LUNG, RIGHT LOWER LOBE; EMB GUIDED BIOPSY:  - NOT DIAGNOSTIC OF MALIGNANCY.   Comment:  Biopsy sections display benign bronchial wall and alveolated lung  parenchyma with blood, organizing fibrin, and mildly thickened alveolar  septae.  Pneumocytes lining alveolar septae display mild atypia, with  increased N:C ratio and mild nuclear contour irregularities. The  significance of these changes is unclear. While a possible lepidic  pattern adenocarcinoma (formerly bronchoalveolar carcinoma/BAC) was  considered, the findings are not diagnostic.  Clinical and radiographic  correlation is  essential.   CYTOLOGY - NON PAP  CASE: [id number]  Non-Gynecological Cytology Report   Comment:   Smears predominantly show benign bronchial epithelial cells in a  background of blood and mixed inflammatory cells.  Few clusters of  bronchial epithelial cells display mild cytologic atypia, with increased  N:C ratio and mild nuclear contour irregularities.  The significance of  these changes is unclear. The findings are not diagnostic of  malignancy, and a reactive process is favored.  CYTOLOGY - NON PAP  CASE: [id number]  Non-Gynecological Cytology Report   DIAGNOSIS:  A. LYMPH NODE, STATION 7; EBUS GUIDED FINE-NEEDLE ASPIRATION:  - NONDIAGNOSTIC.  - MATURE LYMPHOCYTES, BLOOD, AND BENIGN BRONCHIAL EPITHELIAL CELLS.   Comment:  The specimen represents adequate lymph node sampling for staging.   CYTOLOGY - NON PAP  CASE: [id number]  Non-Gynecological Cytology Report   DIAGNOSIS:  A. LYMPH NODE, STATION 4R; EBUS GUIDED FINE-NEEDLE ASPIRATION:  - NONDIAGNOSTIC.  - MATURE LYMPHOCYTES AND BENIGN BRONCHIAL EPITHELIAL CELLS.   CYTOLOGY - NON PAP  CASE: [id number]  Non-Gynecological Cytology Report   DIAGNOSIS:  A. LYMPH NODE, STATION 11R, SUPERIOR; EBUS-GUIDED FINE NEEDLE  ASPIRATION:  - NONDIAGNOSTIC.  - MATURE LYMPHOCYTES, BLOOD, AND BENIGN BRONCHIAL EPITHELIAL CELLS.    Objective:   Vitals:   02/08/22 1357  BP: 138/76  Pulse: 92  Temp: 97.8 F (36.6 C)  TempSrc: Temporal  SpO2: 96%  Weight: 173 lb (78.5 kg)  Height: '5\' 1"'$  (1.549 m)   96% on RA  BMI Readings from Last 3 Encounters:  02/08/22 32.69 kg/m  01/28/22 32.39 kg/m  01/21/22 32.12 kg/m   Wt Readings from Last 3 Encounters:  02/08/22 173 lb (78.5 kg)  01/28/22 171 lb 6.4 oz (77.7 kg)  01/21/22 170 lb (77.1 kg)    Physical Exam Vitals reviewed.  Constitutional:      Appearance: Normal appearance. She is obese.  HENT:     Nose: Nose normal.     Mouth/Throat:     Mouth:  Mucous membranes are moist.  Eyes:     Pupils: Pupils are equal, round, and reactive to light.  Cardiovascular:     Rate and Rhythm: Normal rate and regular rhythm.     Pulses: Normal pulses.     Heart sounds: Normal heart sounds.  Pulmonary:     Effort: Pulmonary effort is normal.     Breath sounds: Normal breath sounds.  Abdominal:     General: There is distension.     Palpations: Abdomen is soft.  Musculoskeletal:     Cervical back: Normal range of motion.  Skin:    General: Skin is warm.  Neurological:     General: No focal deficit present.     Mental Status: She is alert and oriented to person, place, and time.  Mental status is at baseline.       Ancillary Information    Past Medical History:  Diagnosis Date   Allergic genetic state    Arthritis    Bladder spasms    COPD (chronic obstructive pulmonary disease) (HCC)    mild   Dyspnea    GERD (gastroesophageal reflux disease)    History of kidney stones    HLD (hyperlipidemia)    Pneumonia    Pulmonary mass      Family History  Problem Relation Age of Onset   Diabetes Mother    Obesity Mother    Breast cancer Sister 66   Healthy Brother    Brain cancer Brother      Past Surgical History:  Procedure Laterality Date   COLONOSCOPY     COLONOSCOPY N/A 09/27/2021   Procedure: COLONOSCOPY;  Surgeon: Lesly Rubenstein, MD;  Location: [facility] ENDOSCOPY;  Service: Endoscopy;  Laterality: N/A;   COLONOSCOPY WITH PROPOFOL N/A 03/26/2018   Procedure: COLONOSCOPY WITH PROPOFOL;  Surgeon: Lollie Sails, MD;  Location: [facility] ENDOSCOPY;  Service: Endoscopy;  Laterality: N/A;   ESOPHAGOGASTRODUODENOSCOPY     HERNIA REPAIR     TONSILLECTOMY      Social History   Socioeconomic History   Marital status: Widowed    Spouse name: Not on file   Number of children: Not on file   Years of education: Not on file   Highest education level: Not on file  Occupational History   Not on file  Tobacco Use   Smoking  status: Never    Passive exposure: Past   Smokeless tobacco: Never  Vaping Use   Vaping Use: Never used  Substance and Sexual Activity   Alcohol use: Never   Drug use: Never   Sexual activity: Not on file  Other Topics Concern   Not on file  Social History Narrative   Lives alone, grandchild lives with her   Social Determinants of Health   Financial Resource Strain: Not on file  Food Insecurity: Not on file  Transportation Needs: No Transportation Needs (12/31/2021)   PRAPARE - Hydrologist (Medical): No    Lack of Transportation (Non-Medical): No  Physical Activity: Not on file  Stress: Not on file  Social Connections: Not on file  Intimate Partner Violence: Not on file     No Known Allergies   CBC    Component Value Date/Time   WBC 7.1 01/12/2022 1235   RBC 4.23 01/12/2022 1235   HGB 13.0 01/12/2022 1235   HGB 12.4 10/27/2011 0217   HCT 39.0 01/12/2022 1235   HCT 36.8 10/27/2011 0217   PLT 206 01/12/2022 1235   PLT 182 10/27/2011 0217   MCV 92.2 01/12/2022 1235   MCV 91 10/27/2011 0217   MCH 30.7 01/12/2022 1235   MCHC 33.3 01/12/2022 1235   RDW 13.6 01/12/2022 1235   RDW 12.9 10/27/2011 0217   LYMPHSABS 1.9 01/12/2022 1235   LYMPHSABS 2.3 10/27/2011 0217   MONOABS 0.5 01/12/2022 1235   MONOABS 0.7 10/27/2011 0217   EOSABS 0.2 01/12/2022 1235   EOSABS 0.2 10/27/2011 0217   BASOSABS 0.1 01/12/2022 1235   BASOSABS 0.0 10/27/2011 0217    Pulmonary Functions Testing Results:     No data to display          Outpatient Medications Prior to Visit  Medication Sig Dispense Refill   acetaminophen (TYLENOL) 500 MG tablet Take 1,000 mg by mouth every 8 (  eight) hours as needed.     albuterol (VENTOLIN HFA) 108 (90 Base) MCG/ACT inhaler Inhale 2 puffs into the lungs every 6 (six) hours as needed for wheezing or shortness of breath. 8 g 0   aspirin 81 MG EC tablet Take by mouth.     calcium-vitamin D (OSCAL WITH D) 500-200 MG-UNIT  tablet Take 1 tablet by mouth daily with breakfast.     Cyanocobalamin 1000 MCG TBCR Take by mouth.     folic acid (FOLVITE) 1 MG tablet Take by mouth.     hydroxychloroquine (PLAQUENIL) 200 MG tablet Take 1 tablet by mouth 2 (two) times daily.     meloxicam (MOBIC) 7.5 MG tablet Take by mouth as needed.     methotrexate (RHEUMATREX) 2.5 MG tablet Take by mouth. 5 tablets     Omega-3 Fatty Acids (FISH OIL) 1000 MG CAPS Take 1 capsule by mouth daily.     predniSONE (DELTASONE) 10 MG tablet Take 1 tablet (10 mg total) by mouth daily with breakfast. 7 tablet 0   vitamin C (ASCORBIC ACID) 500 MG tablet Take 500 mg by mouth daily.     fluticasone-salmeterol (ADVAIR) 250-50 MCG/ACT AEPB Inhale 1 puff into the lungs daily.     Budeson-Glycopyrrol-Formoterol (BREZTRI AEROSPHERE) 160-9-4.8 MCG/ACT AERO Inhale 2 puffs into the lungs 2 (two) times daily. (Patient not taking: Reported on 02/08/2022) 10.7 g 11   No facility-administered medications prior to visit.

## 2022-02-09 ENCOUNTER — Inpatient Hospital Stay (HOSPITAL_BASED_OUTPATIENT_CLINIC_OR_DEPARTMENT_OTHER): Payer: Medicare HMO | Admitting: Hospice and Palliative Medicine

## 2022-02-09 DIAGNOSIS — R918 Other nonspecific abnormal finding of lung field: Secondary | ICD-10-CM

## 2022-02-09 NOTE — Progress Notes (Signed)
Multidisciplinary Oncology Council Documentation  Kim Bentley was presented by our [facility] on 02/09/2022, which included representatives from:  Palliative Care Dietitian  Physical/Occupational Therapist Nurse Navigator Genetics Speech Therapist Social work Survivorship RN Financial Navigator Research RN   Kim Bentley currently presents with history of lung mass  We reviewed previous medical and familial history, history of present illness, and recent lab results along with all available histopathologic and imaging studies. The [place] considered available treatment options and made the following recommendations/referrals:  None currently  The MOC is a meeting of clinicians from various specialty areas who evaluate and discuss patients for whom a multidisciplinary approach is being considered. Final determinations in the plan of care are those of the provider(s).   Today's extended care, comprehensive team conference, Kim Bentley was not present for the discussion and was not examined.

## 2022-02-11 ENCOUNTER — Ambulatory Visit: Payer: Medicare HMO | Admitting: Student in an Organized Health Care Education/Training Program

## 2022-02-21 ENCOUNTER — Ambulatory Visit: Payer: Medicare HMO | Admitting: Internal Medicine

## 2022-02-22 ENCOUNTER — Ambulatory Visit
Admission: RE | Admit: 2022-02-22 | Discharge: 2022-02-22 | Disposition: A | Discharge status: Home / Self Care | Payer: Medicare HMO | Source: Ambulatory Visit | Attending: Student in an Organized Health Care Education/Training Program | Admitting: Student in an Organized Health Care Education/Training Program

## 2022-02-22 DIAGNOSIS — R918 Other nonspecific abnormal finding of lung field: Secondary | ICD-10-CM | POA: Diagnosis not present

## 2022-02-25 ENCOUNTER — Ambulatory Visit: Payer: Medicare HMO | Admitting: Student in an Organized Health Care Education/Training Program

## 2022-03-02 ENCOUNTER — Telehealth: Payer: Self-pay | Admitting: Student in an Organized Health Care Education/Training Program

## 2022-03-02 NOTE — Telephone Encounter (Signed)
Pt called office stating that she was told by Dr. Genia Harold that a biopsy would be scheduled after her CT. Pt has not heard about the results of the recent  CT nor of any info about the biopsy. Dr. Genia Harold, please advise on this for pt.

## 2022-03-03 NOTE — Telephone Encounter (Signed)
Appt scheduled 03/08/2022 at 2:30. Patient is aware and voiced his understanding.  Nothing further needed.

## 2022-03-08 ENCOUNTER — Encounter: Payer: Self-pay | Admitting: Student in an Organized Health Care Education/Training Program

## 2022-03-08 ENCOUNTER — Ambulatory Visit: Payer: Medicare HMO | Admitting: Student in an Organized Health Care Education/Training Program

## 2022-03-08 VITALS — BP 136/80 | HR 85 | Temp 98.3°F | Ht 61.0 in | Wt 173.2 lb

## 2022-03-08 DIAGNOSIS — R0602 Shortness of breath: Secondary | ICD-10-CM | POA: Diagnosis not present

## 2022-03-08 DIAGNOSIS — R918 Other nonspecific abnormal finding of lung field: Secondary | ICD-10-CM

## 2022-03-08 DIAGNOSIS — M0579 Rheumatoid arthritis with rheumatoid factor of multiple sites without organ or systems involvement: Secondary | ICD-10-CM

## 2022-03-08 DIAGNOSIS — J4489 Other specified chronic obstructive pulmonary disease: Secondary | ICD-10-CM

## 2022-03-08 LAB — NITRIC OXIDE: Nitric Oxide: 9

## 2022-03-08 MED ORDER — BUDESONIDE-FORMOTEROL FUMARATE 80-4.5 MCG/ACT IN AERO: 2.0000 | INHALATION_SPRAY | RESPIRATORY_TRACT | 12 refills | Status: DC | PRN

## 2022-03-08 NOTE — Progress Notes (Signed)
Synopsis: Follow up for shortness of breath and pulmonary nodules.  Assessment & Plan:   #Pulmonary mass  Presents with both RUL and RLL masses that are mildly FDG avid and with associated lymphadenopathy. She did have a RUL nodule in 2011/2012 that was significantly smaller compared to her current mass. She underwent robotic assisted navigational bronchoscopy to the RLL mass, while the RUL mass was not biopsied given difficulty navigating to it. She also underwent EBUS of 3 stations. Samples from the RLL mass had show mild atypia with increased N:C ratio, but there was no finding of malignancy on the transbronchial biopsy or the FNA. Furthermore, FNA from EBUS at enlarged stations 7, 4R and 11R superior was also negative for malignancy with adequate tissue sampled.   The differential includes malignancy (diagnostic uncertainty remains given inability to sample the RUL and possibility of malignancy hiding within the RLL) in addition to infections (bacteria, fungal & non-tuberculous mycobacteria) given her immune suppression with methotrexate.   We discussed of nondiagnostic procedure in detail at the preprocedural visit as well as today. I explained that there still remains a possibility that the nodules might contain malignancy. There has been no significant change following the course of antibiotics and I recommended to the patient a repeat biopsy (robotic assisted with ION vs. CT guided). She prefers the endoscopic approach and we will work on scheduling her for a repeat biopsy of the RUL and the RLL nodule.  #Shortness of breath   Patient has a history of obstruction on PFT's from 12/13/2021 with scooping of the expiratory flow volume loop. While her ratio is preserved (73%), she does have an impaired FEV1 of 1.19 at 68% predicted. 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 FVC is decreased and so is TLC, suggesting mild restriction. DLCO was within normal. CT  high resolution on 11/23/2021 did not show any signs of ILD. 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 She is wheezy on exam today and I am concerned there might be a component of reactive airway disease, especially given the benefit she feels with beta agonists. I will prescribe her an as needed Symbicort in addition to her Trelegy and assess for response on follow up.   - Continue Fluticasone-Umeclidin-Vilant (TRELEGY ELLIPTA) 100-62.5-25 MCG/ACT AEPB; Inhale 1 puff into the lungs daily.  Dispense: 1 each; Refill: 11 - Nitric oxide today - Add budesonide-formoterol (SYMBICORT) 80-4.5 MCG/ACT inhaler; Inhale 2 puffs into the lungs every 4 (four) hours as needed.  Dispense: 1 each; Refill: 12  Return in about 4 months (around 06/24/2022).  I spent 30 minutes caring for this patient today, including preparing to see the patient, obtaining and/or reviewing separately obtained history, performing a medically appropriate examination and/or evaluation, counseling and educating the patient/family/caregiver, ordering medications, tests, or procedures, and documenting clinical information in the electronic health record  Armando Reichert, MD [facility] 03/08/2022 4:19 PM    End of visit medications:  Meds ordered this encounter  Medications   budesonide-formoterol (SYMBICORT) 80-4.5 MCG/ACT inhaler    Sig: Inhale 2 puffs into the lungs every 4 (four) hours as needed.    Dispense:  1 each    Refill:  12     Current Outpatient Medications:    acetaminophen (TYLENOL) 500 MG tablet, Take 1,000 mg by mouth every 8 (eight) hours as needed., Disp: , Rfl:    albuterol (VENTOLIN HFA) 108 (90 Base) MCG/ACT inhaler, Inhale 2 puffs into the  lungs every 6 (six) hours as needed for wheezing or shortness of breath., Disp: 8 g, Rfl: 0   aspirin 81 MG EC tablet, Take by mouth., Disp: , Rfl:    budesonide-formoterol (SYMBICORT) 80-4.5 MCG/ACT inhaler, Inhale 2 puffs  into the lungs every 4 (four) hours as needed., Disp: 1 each, Rfl: 12   calcium-vitamin D (OSCAL WITH D) 500-200 MG-UNIT tablet, Take 1 tablet by mouth daily with breakfast., Disp: , Rfl:    Cyanocobalamin 1000 MCG TBCR, Take by mouth., Disp: , Rfl:    Fluticasone-Umeclidin-Vilant (TRELEGY ELLIPTA) 100-62.5-25 MCG/ACT AEPB, Inhale 1 puff into the lungs daily., Disp: 1 each, Rfl: 11   folic acid (FOLVITE) 1 MG tablet, Take by mouth., Disp: , Rfl:    hydroxychloroquine (PLAQUENIL) 200 MG tablet, Take 1 tablet by mouth 2 (two) times daily., Disp: , Rfl:    meloxicam (MOBIC) 7.5 MG tablet, Take by mouth as needed., Disp: , Rfl:    methotrexate (RHEUMATREX) 2.5 MG tablet, Take by mouth. 5 tablets, Disp: , Rfl:    Omega-3 Fatty Acids (FISH OIL) 1000 MG CAPS, Take 1 capsule by mouth daily., Disp: , Rfl:    vitamin C (ASCORBIC ACID) 500 MG tablet, Take 500 mg by mouth daily., Disp: , Rfl:    Subjective:   PATIENT ID: Kim Bentley GENDER: female DOB: 1940-07-03, MRN: [id number]  Chief Complaint  Patient presents with   Follow-up    CT 02/24/2022-Chest congestion and prod cough with yellow mucus    HPI  Kim Bentley is a pleasant 81 year old female patient who is presenting to clinic for follow up on her pulmonary mass after her recent biopsy.  She was incidentally found to have a pulmonary mass after presenting with shortness of breath following a fall. She underwent a CT scan of the chest for evaluation of her shortness of breath by Dr. Humphrey Rolls which was notable for a large rounded consolidation in the RUL and RLL. She then underwent a PET/CT showing low FDG avidity in both masses. The CT scan was also notable for lymphadenopathy (4R and 7 on my review). She was referred to oncology and saw Dr. Rogue Bussing and was subsequently presented in tumor board and was subsequently referred for biopsy.   She underwent robotic assisted navigational bronchoscopy on 01/21/2022 to the RLL mass, in addition EBUS to  multiple stations. The results from the biopsy has returned benign, with cultures growing Ralstonia Picketti. Similarly, result from the EBUS has been negative for malignancy (results below). She finished a course of ciprofloxacin for the Ralstonia and has subsequently underwent a repeat CT scan of the chest. During our last visit we also switched her inhalers to Trelegy once daily that she feels has been helpful.   She reports feeling well overall. She was prescribed a short course of prednisone last month after calling and and she has since felt better Her symptoms have somewhat recurred and she feels she is wheezing. She currently reports a cough and some wheezing, but denies, chest pain or tightness.   She is a non-smoker. Patient medical history is notable for rheumatoid arthritis for which she is on methotrexate. She was supposed to be on Breztri but reports that it did not help much with her symptoms.  Pathology Reports 01/21/2022   SURGICAL PATHOLOGY   CASE: [id number]  Surgical Pathology Report   DIAGNOSIS:  A. LUNG, RIGHT LOWER LOBE; EMB GUIDED BIOPSY:  - NOT DIAGNOSTIC OF MALIGNANCY.   Comment:  Biopsy sections display  benign bronchial wall and alveolated lung  parenchyma with blood, organizing fibrin, and mildly thickened alveolar  septae.  Pneumocytes lining alveolar septae display mild atypia, with  increased N:C ratio and mild nuclear contour irregularities. The  significance of these changes is unclear. While a possible lepidic  pattern adenocarcinoma (formerly bronchoalveolar carcinoma/BAC) was  considered, the findings are not diagnostic.  Clinical and radiographic  correlation is essential.    CYTOLOGY - NON PAP  CASE: [id number]  Non-Gynecological Cytology Report   Comment:   Smears predominantly show benign bronchial epithelial cells in a  background of blood and mixed inflammatory cells.  Few clusters of  bronchial epithelial cells display mild cytologic  atypia, with increased  N:C ratio and mild nuclear contour irregularities.  The significance of  these changes is unclear. The findings are not diagnostic of  malignancy, and a reactive process is favored.   CYTOLOGY - NON PAP  CASE: [id number]  Non-Gynecological Cytology Report   DIAGNOSIS:  A. LYMPH NODE, STATION 7; EBUS GUIDED FINE-NEEDLE ASPIRATION:  - NONDIAGNOSTIC.  - MATURE LYMPHOCYTES, BLOOD, AND BENIGN BRONCHIAL EPITHELIAL CELLS.   Comment:  The specimen represents adequate lymph node sampling for staging.    CYTOLOGY - NON PAP  CASE: [id number]  Non-Gynecological Cytology Report   DIAGNOSIS:  A. LYMPH NODE, STATION 4R; EBUS GUIDED FINE-NEEDLE ASPIRATION:  - NONDIAGNOSTIC.  - MATURE LYMPHOCYTES AND BENIGN BRONCHIAL EPITHELIAL CELLS.    CYTOLOGY - NON PAP  CASE: [id number]  Non-Gynecological Cytology Report   DIAGNOSIS:  A. LYMPH NODE, STATION 11R, SUPERIOR; EBUS-GUIDED FINE NEEDLE  ASPIRATION:  - NONDIAGNOSTIC.  - MATURE LYMPHOCYTES, BLOOD, AND BENIGN BRONCHIAL EPITHELIAL CELLS.   Ancillary information including prior medications, full medical/surgical/family/social histories, and PFTs (when available) are listed below and have been reviewed.   Review of Systems  Constitutional:  Negative for chills and fever.  Respiratory:  Positive for cough, shortness of breath and wheezing. Negative for hemoptysis and sputum production.   Cardiovascular:  Negative for chest pain.  Skin:  Negative for rash.     Objective:   Vitals:   03/08/22 1411  BP: 136/80  Pulse: 85  Temp: 98.3 F (36.8 C)  TempSrc: Temporal  SpO2: 94%  Weight: 173 lb 3.2 oz (78.6 kg)  Height: '5\' 1"'$  (1.549 m)   94% on RA  BMI Readings from Last 3 Encounters:  03/08/22 32.73 kg/m  02/08/22 32.69 kg/m  01/28/22 32.39 kg/m   Wt Readings from Last 3 Encounters:  03/08/22 173 lb 3.2 oz (78.6 kg)  02/08/22 173 lb (78.5 kg)  01/28/22 171 lb 6.4 oz (77.7 kg)    Physical  Exam Vitals reviewed.  Constitutional:      Appearance: Normal appearance. She is obese.  HENT:     Nose: Nose normal.     Mouth/Throat:     Mouth: Mucous membranes are moist.  Eyes:     Pupils: Pupils are equal, round, and reactive to light.  Cardiovascular:     Rate and Rhythm: Normal rate and regular rhythm.     Pulses: Normal pulses.     Heart sounds: Normal heart sounds.  Pulmonary:     Effort: Pulmonary effort is normal.     Breath sounds: Wheezing present.  Abdominal:     General: There is distension.     Palpations: Abdomen is soft.  Musculoskeletal:     Cervical back: Normal range of motion.  Skin:    General: Skin is warm.  Neurological:  General: No focal deficit present.     Mental Status: She is alert and oriented to person, place, and time. Mental status is at baseline.       Ancillary Information    Past Medical History:  Diagnosis Date   Allergic genetic state    Arthritis    Bladder spasms    COPD (chronic obstructive pulmonary disease) (HCC)    mild   Dyspnea    GERD (gastroesophageal reflux disease)    History of kidney stones    HLD (hyperlipidemia)    Pneumonia    Pulmonary mass      Family History  Problem Relation Age of Onset   Diabetes Mother    Obesity Mother    Breast cancer Sister 87   Healthy Brother    Brain cancer Brother      Past Surgical History:  Procedure Laterality Date   COLONOSCOPY     COLONOSCOPY N/A 09/27/2021   Procedure: COLONOSCOPY;  Surgeon: Lesly Rubenstein, MD;  Location: [facility] ENDOSCOPY;  Service: Endoscopy;  Laterality: N/A;   COLONOSCOPY WITH PROPOFOL N/A 03/26/2018   Procedure: COLONOSCOPY WITH PROPOFOL;  Surgeon: Lollie Sails, MD;  Location: [facility] ENDOSCOPY;  Service: Endoscopy;  Laterality: N/A;   ESOPHAGOGASTRODUODENOSCOPY     HERNIA REPAIR     TONSILLECTOMY      Social History   Socioeconomic History   Marital status: Widowed    Spouse name: Not on file   Number of children: Not  on file   Years of education: Not on file   Highest education level: Not on file  Occupational History   Not on file  Tobacco Use   Smoking status: Never    Passive exposure: Past   Smokeless tobacco: Never  Vaping Use   Vaping Use: Never used  Substance and Sexual Activity   Alcohol use: Never   Drug use: Never   Sexual activity: Not on file  Other Topics Concern   Not on file  Social History Narrative   Lives alone, grandchild lives with her   Social Determinants of Health   Financial Resource Strain: Not on file  Food Insecurity: Not on file  Transportation Needs: No Transportation Needs (12/31/2021)   PRAPARE - Hydrologist (Medical): No    Lack of Transportation (Non-Medical): No  Physical Activity: Not on file  Stress: Not on file  Social Connections: Not on file  Intimate Partner Violence: Not on file     No Known Allergies   CBC    Component Value Date/Time   WBC 7.1 01/12/2022 1235   RBC 4.23 01/12/2022 1235   HGB 13.0 01/12/2022 1235   HGB 12.4 10/27/2011 0217   HCT 39.0 01/12/2022 1235   HCT 36.8 10/27/2011 0217   PLT 206 01/12/2022 1235   PLT 182 10/27/2011 0217   MCV 92.2 01/12/2022 1235   MCV 91 10/27/2011 0217   MCH 30.7 01/12/2022 1235   MCHC 33.3 01/12/2022 1235   RDW 13.6 01/12/2022 1235   RDW 12.9 10/27/2011 0217   LYMPHSABS 1.9 01/12/2022 1235   LYMPHSABS 2.3 10/27/2011 0217   MONOABS 0.5 01/12/2022 1235   MONOABS 0.7 10/27/2011 0217   EOSABS 0.2 01/12/2022 1235   EOSABS 0.2 10/27/2011 0217   BASOSABS 0.1 01/12/2022 1235   BASOSABS 0.0 10/27/2011 0217    Pulmonary Functions Testing Results:     No data to display          Outpatient Medications Prior  to Visit  Medication Sig Dispense Refill   acetaminophen (TYLENOL) 500 MG tablet Take 1,000 mg by mouth every 8 (eight) hours as needed.     albuterol (VENTOLIN HFA) 108 (90 Base) MCG/ACT inhaler Inhale 2 puffs into the lungs every 6 (six) hours as  needed for wheezing or shortness of breath. 8 g 0   aspirin 81 MG EC tablet Take by mouth.     calcium-vitamin D (OSCAL WITH D) 500-200 MG-UNIT tablet Take 1 tablet by mouth daily with breakfast.     Cyanocobalamin 1000 MCG TBCR Take by mouth.     Fluticasone-Umeclidin-Vilant (TRELEGY ELLIPTA) 100-62.5-25 MCG/ACT AEPB Inhale 1 puff into the lungs daily. 1 each 11   folic acid (FOLVITE) 1 MG tablet Take by mouth.     hydroxychloroquine (PLAQUENIL) 200 MG tablet Take 1 tablet by mouth 2 (two) times daily.     meloxicam (MOBIC) 7.5 MG tablet Take by mouth as needed.     methotrexate (RHEUMATREX) 2.5 MG tablet Take by mouth. 5 tablets     Omega-3 Fatty Acids (FISH OIL) 1000 MG CAPS Take 1 capsule by mouth daily.     vitamin C (ASCORBIC ACID) 500 MG tablet Take 500 mg by mouth daily.     predniSONE (DELTASONE) 10 MG tablet Take 1 tablet (10 mg total) by mouth daily with breakfast. 7 tablet 0   No facility-administered medications prior to visit.

## 2022-03-14 ENCOUNTER — Telehealth: Payer: Self-pay | Admitting: Student in an Organized Health Care Education/Training Program

## 2022-03-14 DIAGNOSIS — R0602 Shortness of breath: Secondary | ICD-10-CM

## 2022-03-14 NOTE — Telephone Encounter (Signed)
Spoke to patient. She stated that she started Symbicort as prescribed and sx have not improved. C/o Prod cough with yellow mucus, chest congestion, wheezing and SOB. Sx have been present x24mo  She is requesting prednisone and abx.  Denied f/c/s or additional sx.  No recent covid test.  She is using albuterol HFA 3-4x daily and Symbicort Q4H.   Dr. DGenia Harold please advise. Thanks

## 2022-03-15 MED ORDER — AZITHROMYCIN 250 MG PO TABS: ORAL_TABLET | ORAL | 0 refills | Status: AC

## 2022-03-15 NOTE — Telephone Encounter (Signed)
Kim Bentley, please schedule. Thanks

## 2022-03-15 NOTE — Telephone Encounter (Signed)
Dr. Dgayli, please advise. Thanks 

## 2022-03-16 ENCOUNTER — Other Ambulatory Visit
Admission: RE | Admit: 2022-03-16 | Discharge: 2022-03-16 | Disposition: A | Discharge status: Home / Self Care | Payer: Medicare HMO | Source: Ambulatory Visit | Attending: Student in an Organized Health Care Education/Training Program | Admitting: Student in an Organized Health Care Education/Training Program

## 2022-03-16 DIAGNOSIS — R0602 Shortness of breath: Secondary | ICD-10-CM | POA: Diagnosis not present

## 2022-03-18 ENCOUNTER — Other Ambulatory Visit
Admission: RE | Admit: 2022-03-18 | Discharge: 2022-03-18 | Disposition: A | Discharge status: Home / Self Care | Payer: Medicare HMO | Source: Ambulatory Visit | Attending: Family Medicine | Admitting: Family Medicine

## 2022-03-21 DIAGNOSIS — J441 Chronic obstructive pulmonary disease with (acute) exacerbation: Secondary | ICD-10-CM

## 2022-03-23 NOTE — Telephone Encounter (Signed)
I spoke with Nancee Liter before Thanksgiving about getting this scheduled. I left a message again today for Pamala Hurry to get this scheduled

## 2022-03-23 NOTE — Telephone Encounter (Signed)
Noted  

## 2022-03-23 NOTE — Telephone Encounter (Signed)
Patient stated the test was done on 03/18/22 waiting the results

## 2022-03-28 DIAGNOSIS — Z1331 Encounter for screening for depression: Secondary | ICD-10-CM | POA: Diagnosis not present

## 2022-03-28 DIAGNOSIS — Z0000 Encounter for general adult medical examination without abnormal findings: Secondary | ICD-10-CM | POA: Diagnosis not present

## 2022-03-28 DIAGNOSIS — R7309 Other abnormal glucose: Secondary | ICD-10-CM | POA: Diagnosis not present

## 2022-03-28 DIAGNOSIS — M0579 Rheumatoid arthritis with rheumatoid factor of multiple sites without organ or systems involvement: Secondary | ICD-10-CM | POA: Diagnosis not present

## 2022-03-28 DIAGNOSIS — R32 Unspecified urinary incontinence: Secondary | ICD-10-CM | POA: Diagnosis not present

## 2022-03-28 DIAGNOSIS — M85842 Other specified disorders of bone density and structure, left hand: Secondary | ICD-10-CM | POA: Diagnosis not present

## 2022-03-28 DIAGNOSIS — M069 Rheumatoid arthritis, unspecified: Secondary | ICD-10-CM | POA: Diagnosis not present

## 2022-03-28 DIAGNOSIS — N3946 Mixed incontinence: Secondary | ICD-10-CM | POA: Diagnosis not present

## 2022-03-28 DIAGNOSIS — M85841 Other specified disorders of bone density and structure, right hand: Secondary | ICD-10-CM | POA: Diagnosis not present

## 2022-03-29 ENCOUNTER — Encounter: Payer: Self-pay | Admitting: Student in an Organized Health Care Education/Training Program

## 2022-03-29 ENCOUNTER — Ambulatory Visit: Payer: Medicare HMO | Admitting: Student in an Organized Health Care Education/Training Program

## 2022-03-29 VITALS — BP 134/78 | HR 84 | Temp 97.8°F | Ht 61.0 in | Wt 171.0 lb

## 2022-03-29 DIAGNOSIS — R053 Chronic cough: Secondary | ICD-10-CM

## 2022-03-29 DIAGNOSIS — R918 Other nonspecific abnormal finding of lung field: Secondary | ICD-10-CM | POA: Diagnosis not present

## 2022-03-29 DIAGNOSIS — R0602 Shortness of breath: Secondary | ICD-10-CM

## 2022-03-29 MED ORDER — LEVOCETIRIZINE DIHYDROCHLORIDE 5 MG PO TABS: 5.0000 mg | ORAL_TABLET | Freq: Every evening | ORAL | 11 refills | Status: DC

## 2022-03-29 NOTE — Progress Notes (Signed)
Synopsis: Regular follow up for pulmonary nodules and cough  Assessment & Plan:   #Pulmonary Mass  Presents with both RUL and RLL masses that are mildly FDG avid and with associated lymphadenopathy. She underwent robotic assisted navigational bronchoscopy to the RLL mass, while the RUL mass was not biopsied given difficulty navigating to it. She also underwent EBUS of 3 stations. Samples from the RLL mass had show mild atypia with increased N:C ratio, but there was no finding of malignancy on the transbronchial biopsy or the FNA. Furthermore, FNA from EBUS at enlarged stations 7, 4R and 11R superior was also negative for malignancy with adequate tissue sampled.   The differential includes malignancy (diagnostic uncertainty remains given inability to sample the RUL and possibility of malignancy hiding within the RLL) in addition to infections (bacteria, fungal & non-tuberculous mycobacteria) given her immune suppression with methotrexate.   We discussed of nondiagnostic procedure in detail at the preprocedural visit as well as today. 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 Repeat imaging did not show any change in the size of the pulmonary nodules.  I did discuss this diagnostic uncertainty again with the patient. We will proceed with repeat robotic assisted navigational bronchoscopy with cone beam guidance to obtain tissue for pathology and culture from both nodules.  -Robotic assisted navigational bronchoscopy   #Shortness of breath   Patient has a history of obstruction on PFT's from 12/13/2021 with scooping of the expiratory flow volume loop. While her ratio is preserved (73%), she does have an impaired FEV1 of 1.19 at 68% predicted. 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 FVC is decreased and so is TLC, suggesting mild restriction.  DLCO was within normal. CT high resolution on 11/23/2021 did not show any signs of ILD. 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 I did prescribe her Trelegy during our last visit which she benefited from.   Given her recurrent symptoms of wheezing, I augmented her therapy with PRN Symbicort which she feels has helped her a lot. This combination therapy has helped significantly with her symptoms burden and I will continue the current regimen of Trelegy one puff daily and Symbicort every 6 hours PRN.  #Chronic cough  Continues to have a chronic cough and urge to clear her throat. Very likely she has an upper respiratory cough syndrome further contributing to her symptoms that I will treat with a trial of second generation anti-histamines.  - levocetirizine (XYZAL) 5 MG tablet; Take 1 tablet (5 mg total) by mouth every evening.  Dispense: 30 tablet; Refill: 11   Return in about 4 months (around 07/29/2022).  I spent 30 minutes caring for this patient today, including preparing to see the patient, obtaining a medical history , reviewing a separately obtained history, performing a medically appropriate examination and/or evaluation, counseling and educating the patient/family/caregiver, ordering medications, tests, or procedures, and documenting clinical information in the electronic health record  Armando Reichert, MD [facility] 03/29/2022 2:01 PM    End of visit medications:  Meds ordered this encounter  Medications   levocetirizine (XYZAL) 5 MG tablet    Sig: Take 1 tablet (5 mg total) by mouth every evening.    Dispense:  30 tablet    Refill:  11     Current Outpatient Medications:    acetaminophen (TYLENOL) 500 MG tablet, Take 1,000 mg by  mouth every 8 (eight) hours as needed., Disp: , Rfl:    albuterol (VENTOLIN HFA) 108 (90 Base) MCG/ACT inhaler, Inhale 2 puffs into the lungs every 6 (six) hours as needed for wheezing or shortness of  breath., Disp: 8 g, Rfl: 0   aspirin 81 MG EC tablet, Take by mouth., Disp: , Rfl:    budesonide-formoterol (SYMBICORT) 80-4.5 MCG/ACT inhaler, Inhale 2 puffs into the lungs every 4 (four) hours as needed., Disp: 1 each, Rfl: 12   calcium-vitamin D (OSCAL WITH D) 500-200 MG-UNIT tablet, Take 1 tablet by mouth daily with breakfast., Disp: , Rfl:    Cyanocobalamin 1000 MCG TBCR, Take by mouth., Disp: , Rfl:    Fluticasone-Umeclidin-Vilant (TRELEGY ELLIPTA) 100-62.5-25 MCG/ACT AEPB, Inhale 1 puff into the lungs daily., Disp: 1 each, Rfl: 11   folic acid (FOLVITE) 1 MG tablet, Take by mouth., Disp: , Rfl:    hydroxychloroquine (PLAQUENIL) 200 MG tablet, Take 1 tablet by mouth 2 (two) times daily., Disp: , Rfl:    levocetirizine (XYZAL) 5 MG tablet, Take 1 tablet (5 mg total) by mouth every evening., Disp: 30 tablet, Rfl: 11   meloxicam (MOBIC) 7.5 MG tablet, Take by mouth as needed., Disp: , Rfl:    methotrexate (RHEUMATREX) 2.5 MG tablet, Take by mouth. 5 tablets, Disp: , Rfl:    Omega-3 Fatty Acids (FISH OIL) 1000 MG CAPS, Take 1 capsule by mouth daily., Disp: , Rfl:    vitamin C (ASCORBIC ACID) 500 MG tablet, Take 500 mg by mouth daily., Disp: , Rfl:    Subjective:   PATIENT ID: Kim Bentley GENDER: female DOB: [birth year], MRN: [id number]  Chief Complaint  Patient presents with   Follow-up    SOB with exertion and prod cough with white sputum.     HPI  Kim Bentley is a pleasant 81 year old female patient who is presenting to clinic for follow up on her pulmonary mass after her recent biopsy as well as on symptoms of cough and shortness of breath.   She was incidentally found to have a pulmonary mass after presenting with shortness of breath following a fall. She underwent a CT scan of the chest for evaluation of her shortness of breath by Dr. Humphrey Rolls which was notable for a large rounded consolidation in the RUL and RLL. She then underwent a PET/CT showing low FDG avidity in both masses. The  CT scan was also notable for lymphadenopathy (4R and 7 on my review). She was referred to oncology and saw Dr. Rogue Bussing and was subsequently presented in tumor board and was subsequently referred for biopsy.   She underwent robotic assisted navigational bronchoscopy on 01/21/2022 to the RLL mass, in addition EBUS to multiple stations. The results from the biopsy has returned benign, with cultures growing Ralstonia Picketti. Similarly, result from the EBUS has been negative for malignancy.   She reports feeling well and has no symptoms at the moment. She did call our office in November with symptoms of wheezing, cough and shortness of breath for which we prescribed a course of antibiotics and augmented her inhaler regimen with PRN Symbicort. Induced sputa were sent and are pending for AFB. She now feels significantly better compared to prior. She still has a mild cough and some shortness of breath but much improved from prior.   She is a non-smoker. Patient medical history is notable for rheumatoid arthritis for which she is on methotrexate. She was supposed to be on Breztri but reports that it did  not help much with her symptoms.  Ancillary information including prior medications, full medical/surgical/family/social histories, and PFTs (when available) are listed below and have been reviewed.   Review of Systems  Constitutional:  Negative for chills and fever.  Respiratory:  Positive for cough and shortness of breath. Negative for hemoptysis, sputum production and wheezing.   Cardiovascular:  Negative for chest pain.  Skin:  Negative for rash.     Objective:   Vitals:   03/29/22 1346  BP: 134/78  Pulse: 84  Temp: 97.8 F (36.6 C)  TempSrc: Temporal  SpO2: 97%  Weight: 171 lb (77.6 kg)  Height: '5\' 1"'$  (1.549 m)   97% on RA  BMI Readings from Last 3 Encounters:  03/29/22 32.31 kg/m  03/08/22 32.73 kg/m  02/08/22 32.69 kg/m   Wt Readings from Last 3 Encounters:  03/29/22 171 lb  (77.6 kg)  03/08/22 173 lb 3.2 oz (78.6 kg)  02/08/22 173 lb (78.5 kg)    Physical Exam Vitals reviewed.  Constitutional:      Appearance: Normal appearance. She is obese.  HENT:     Nose: Nose normal.     Mouth/Throat:     Mouth: Mucous membranes are moist.  Eyes:     Pupils: Pupils are equal, round, and reactive to light.  Cardiovascular:     Rate and Rhythm: Normal rate and regular rhythm.     Pulses: Normal pulses.     Heart sounds: Normal heart sounds.  Pulmonary:     Effort: Pulmonary effort is normal.     Breath sounds: Normal breath sounds. No wheezing or rhonchi.  Abdominal:     General: There is distension.     Palpations: Abdomen is soft.  Musculoskeletal:     Cervical back: Normal range of motion.  Skin:    General: Skin is warm.  Neurological:     General: No focal deficit present.     Mental Status: She is alert and oriented to person, place, and time. Mental status is at baseline.       Ancillary Information    Past Medical History:  Diagnosis Date   Allergic genetic state    Arthritis    Bladder spasms    COPD (chronic obstructive pulmonary disease) (HCC)    mild   Dyspnea    GERD (gastroesophageal reflux disease)    History of kidney stones    HLD (hyperlipidemia)    Pneumonia    Pulmonary mass      Family History  Problem Relation Age of Onset   Diabetes Mother    Obesity Mother    Breast cancer Sister 52   Healthy Brother    Brain cancer Brother      Past Surgical History:  Procedure Laterality Date   COLONOSCOPY     COLONOSCOPY N/A 09/27/2021   Procedure: COLONOSCOPY;  Surgeon: Lesly Rubenstein, MD;  Location: [facility] ENDOSCOPY;  Service: Endoscopy;  Laterality: N/A;   COLONOSCOPY WITH PROPOFOL N/A 03/26/2018   Procedure: COLONOSCOPY WITH PROPOFOL;  Surgeon: Lollie Sails, MD;  Location: [facility] ENDOSCOPY;  Service: Endoscopy;  Laterality: N/A;   ESOPHAGOGASTRODUODENOSCOPY     HERNIA REPAIR     TONSILLECTOMY      Social  History   Socioeconomic History   Marital status: Widowed    Spouse name: Not on file   Number of children: Not on file   Years of education: Not on file   Highest education level: Not on file  Occupational History  Not on file  Tobacco Use   Smoking status: Never    Passive exposure: Past   Smokeless tobacco: Never  Vaping Use   Vaping Use: Never used  Substance and Sexual Activity   Alcohol use: Never   Drug use: Never   Sexual activity: Not on file  Other Topics Concern   Not on file  Social History Narrative   Lives alone, grandchild lives with her   Social Determinants of Health   Financial Resource Strain: Not on file  Food Insecurity: Not on file  Transportation Needs: No Transportation Needs (12/31/2021)   PRAPARE - Hydrologist (Medical): No    Lack of Transportation (Non-Medical): No  Physical Activity: Not on file  Stress: Not on file  Social Connections: Not on file  Intimate Partner Violence: Not on file     No Known Allergies   CBC    Component Value Date/Time   WBC 7.1 01/12/2022 1235   RBC 4.23 01/12/2022 1235   HGB 13.0 01/12/2022 1235   HGB 12.4 10/27/2011 0217   HCT 39.0 01/12/2022 1235   HCT 36.8 10/27/2011 0217   PLT 206 01/12/2022 1235   PLT 182 10/27/2011 0217   MCV 92.2 01/12/2022 1235   MCV 91 10/27/2011 0217   MCH 30.7 01/12/2022 1235   MCHC 33.3 01/12/2022 1235   RDW 13.6 01/12/2022 1235   RDW 12.9 10/27/2011 0217   LYMPHSABS 1.9 01/12/2022 1235   LYMPHSABS 2.3 10/27/2011 0217   MONOABS 0.5 01/12/2022 1235   MONOABS 0.7 10/27/2011 0217   EOSABS 0.2 01/12/2022 1235   EOSABS 0.2 10/27/2011 0217   BASOSABS 0.1 01/12/2022 1235   BASOSABS 0.0 10/27/2011 0217    Pulmonary Functions Testing Results:     No data to display          Outpatient Medications Prior to Visit  Medication Sig Dispense Refill   acetaminophen (TYLENOL) 500 MG tablet Take 1,000 mg by mouth every 8 (eight) hours as needed.      albuterol (VENTOLIN HFA) 108 (90 Base) MCG/ACT inhaler Inhale 2 puffs into the lungs every 6 (six) hours as needed for wheezing or shortness of breath. 8 g 0   aspirin 81 MG EC tablet Take by mouth.     budesonide-formoterol (SYMBICORT) 80-4.5 MCG/ACT inhaler Inhale 2 puffs into the lungs every 4 (four) hours as needed. 1 each 12   calcium-vitamin D (OSCAL WITH D) 500-200 MG-UNIT tablet Take 1 tablet by mouth daily with breakfast.     Cyanocobalamin 1000 MCG TBCR Take by mouth.     Fluticasone-Umeclidin-Vilant (TRELEGY ELLIPTA) 100-62.5-25 MCG/ACT AEPB Inhale 1 puff into the lungs daily. 1 each 11   folic acid (FOLVITE) 1 MG tablet Take by mouth.     hydroxychloroquine (PLAQUENIL) 200 MG tablet Take 1 tablet by mouth 2 (two) times daily.     meloxicam (MOBIC) 7.5 MG tablet Take by mouth as needed.     methotrexate (RHEUMATREX) 2.5 MG tablet Take by mouth. 5 tablets     Omega-3 Fatty Acids (FISH OIL) 1000 MG CAPS Take 1 capsule by mouth daily.     vitamin C (ASCORBIC ACID) 500 MG tablet Take 500 mg by mouth daily.     No facility-administered medications prior to visit.

## 2022-04-01 ENCOUNTER — Encounter: Payer: Self-pay | Admitting: Student in an Organized Health Care Education/Training Program

## 2022-04-01 ENCOUNTER — Other Ambulatory Visit: Payer: Self-pay | Admitting: Student in an Organized Health Care Education/Training Program

## 2022-04-01 DIAGNOSIS — R918 Other nonspecific abnormal finding of lung field: Secondary | ICD-10-CM

## 2022-04-11 ENCOUNTER — Telehealth: Payer: Self-pay | Admitting: Student in an Organized Health Care Education/Training Program

## 2022-04-11 NOTE — Telephone Encounter (Signed)
ATC x1.  LVM to return call. 

## 2022-04-11 NOTE — Telephone Encounter (Signed)
Primary Pulmonologist: Dgayli Last office visit and with whom: 03/29/2022 Dgayli What do we see them for (pulmonary problems): chronic cough, sob, pulmonary mass Last OV assessment/plan:  Chief Complaint  Patient presents with   Follow-up      SOB with exertion and prod cough with white sputum.       HPI   Kim Bentley is a pleasant 81 year old female patient who is presenting to clinic for follow up on her pulmonary mass after her recent biopsy as well as on symptoms of cough and shortness of breath.   She was incidentally found to have a pulmonary mass after presenting with shortness of breath following a fall. She underwent a CT scan of the chest for evaluation of her shortness of breath by Dr. Humphrey Rolls which was notable for a large rounded consolidation in the RUL and RLL. She then underwent a PET/CT showing low FDG avidity in both masses. The CT scan was also notable for lymphadenopathy (4R and 7 on my review). She was referred to oncology and saw Dr. Rogue Bussing and was subsequently presented in tumor board and was subsequently referred for biopsy.   She underwent robotic assisted navigational bronchoscopy on 01/21/2022 to the RLL mass, in addition EBUS to multiple stations. The results from the biopsy has returned benign, with cultures growing Ralstonia Picketti. Similarly, result from the EBUS has been negative for malignancy.   She reports feeling well and has no symptoms at the moment. She did call our office in November with symptoms of wheezing, cough and shortness of breath for which we prescribed a course of antibiotics and augmented her inhaler regimen with PRN Symbicort. Induced sputa were sent and are pending for AFB. She now feels significantly better compared to prior. She still has a mild cough and some shortness of breath but much improved from prior.   She is a non-smoker. Patient medical history is notable for rheumatoid arthritis for which she is on methotrexate. She was supposed  to be on Breztri but reports that it did not help much with her symptoms.   Ancillary information including prior medications, full medical/surgical/family/social histories, and PFTs (when available) are listed below and have been reviewed.    Review of Systems  Constitutional:  Negative for chills and fever.  Respiratory:  Positive for cough and shortness of breath. Negative for hemoptysis, sputum production and wheezing.   Cardiovascular:  Negative for chest pain.  Skin:  Negative for rash.        Objective:       Vitals:    03/29/22 1346  BP: 134/78  Pulse: 84  Temp: 97.8 F (36.6 C)  TempSrc: Temporal  SpO2: 97%  Weight: 171 lb (77.6 kg)  Height: '5\' 1"'$  (1.549 m)    97% on RA      BMI Readings from Last 3 Encounters:  03/29/22 32.31 kg/m  03/08/22 32.73 kg/m  02/08/22 32.69 kg/m       Wt Readings from Last 3 Encounters:  03/29/22 171 lb (77.6 kg)  03/08/22 173 lb 3.2 oz (78.6 kg)  02/08/22 173 lb (78.5 kg)      Physical Exam Vitals reviewed.  Constitutional:      Appearance: Normal appearance. She is obese.  HENT:     Nose: Nose normal.     Mouth/Throat:     Mouth: Mucous membranes are moist.  Eyes:     Pupils: Pupils are equal, round, and reactive to light.  Cardiovascular:     Rate  and Rhythm: Normal rate and regular rhythm.     Pulses: Normal pulses.     Heart sounds: Normal heart sounds.  Pulmonary:     Effort: Pulmonary effort is normal.     Breath sounds: Normal breath sounds. No wheezing or rhonchi.  Abdominal:     General: There is distension.     Palpations: Abdomen is soft.  Musculoskeletal:     Cervical back: Normal range of motion.  Skin:    General: Skin is warm.  Neurological:     General: No focal deficit present.     Mental Status: She is alert and oriented to person, place, and time. Mental status is at baseline.            Ancillary Information          Past Medical History:  Diagnosis Date   Allergic genetic state      Arthritis     Bladder spasms     COPD (chronic obstructive pulmonary disease) (HCC)      mild   Dyspnea     GERD (gastroesophageal reflux disease)     History of kidney stones     HLD (hyperlipidemia)     Pneumonia     Pulmonary mass             Family History  Problem Relation Age of Onset   Diabetes Mother     Obesity Mother     Breast cancer Sister 33   Healthy Brother     Brain cancer Brother             Past Surgical History:  Procedure Laterality Date   COLONOSCOPY       COLONOSCOPY N/A 09/27/2021    Procedure: COLONOSCOPY;  Surgeon: Lesly Rubenstein, MD;  Location: [facility] ENDOSCOPY;  Service: Endoscopy;  Laterality: N/A;   COLONOSCOPY WITH PROPOFOL N/A 03/26/2018    Procedure: COLONOSCOPY WITH PROPOFOL;  Surgeon: Lollie Sails, MD;  Location: [facility] ENDOSCOPY;  Service: Endoscopy;  Laterality: N/A;   ESOPHAGOGASTRODUODENOSCOPY       HERNIA REPAIR       TONSILLECTOMY          Social History         Socioeconomic History   Marital status: Widowed      Spouse name: Not on file   Number of children: Not on file   Years of education: Not on file   Highest education level: Not on file  Occupational History   Not on file  Tobacco Use   Smoking status: Never      Passive exposure: Past   Smokeless tobacco: Never  Vaping Use   Vaping Use: Never used  Substance and Sexual Activity   Alcohol use: Never   Drug use: Never   Sexual activity: Not on file  Other Topics Concern   Not on file  Social History Narrative    Lives alone, grandchild lives with her    Social Determinants of Health        Financial Resource Strain: Not on file  Food Insecurity: Not on file  Transportation Needs: No Transportation Needs (12/31/2021)    PRAPARE - Armed forces logistics/support/administrative officer (Medical): No     Lack of Transportation (Non-Medical): No  Physical Activity: Not on file  Stress: Not on file  Social Connections: Not on file  Intimate Partner Violence:  Not on file      No Known Allergies    CBC  Labs (Brief)          Component Value Date/Time    WBC 7.1 01/12/2022 1235    RBC 4.23 01/12/2022 1235    HGB 13.0 01/12/2022 1235    HGB 12.4 10/27/2011 0217    HCT 39.0 01/12/2022 1235    HCT 36.8 10/27/2011 0217    PLT 206 01/12/2022 1235    PLT 182 10/27/2011 0217    MCV 92.2 01/12/2022 1235    MCV 91 10/27/2011 0217    MCH 30.7 01/12/2022 1235    MCHC 33.3 01/12/2022 1235    RDW 13.6 01/12/2022 1235    RDW 12.9 10/27/2011 0217    LYMPHSABS 1.9 01/12/2022 1235    LYMPHSABS 2.3 10/27/2011 0217    MONOABS 0.5 01/12/2022 1235    MONOABS 0.7 10/27/2011 0217    EOSABS 0.2 01/12/2022 1235    EOSABS 0.2 10/27/2011 0217    BASOSABS 0.1 01/12/2022 1235    BASOSABS 0.0 10/27/2011 0217        Pulmonary Functions Testing Results:       No data to display                   Outpatient Medications Prior to Visit  Medication Sig Dispense Refill   acetaminophen (TYLENOL) 500 MG tablet Take 1,000 mg by mouth every 8 (eight) hours as needed.       albuterol (VENTOLIN HFA) 108 (90 Base) MCG/ACT inhaler Inhale 2 puffs into the lungs every 6 (six) hours as needed for wheezing or shortness of breath. 8 g 0   aspirin 81 MG EC tablet Take by mouth.       budesonide-formoterol (SYMBICORT) 80-4.5 MCG/ACT inhaler Inhale 2 puffs into the lungs every 4 (four) hours as needed. 1 each 12   calcium-vitamin D (OSCAL WITH D) 500-200 MG-UNIT tablet Take 1 tablet by mouth daily with breakfast.       Cyanocobalamin 1000 MCG TBCR Take by mouth.       Fluticasone-Umeclidin-Vilant (TRELEGY ELLIPTA) 100-62.5-25 MCG/ACT AEPB Inhale 1 puff into the lungs daily. 1 each 11   folic acid (FOLVITE) 1 MG tablet Take by mouth.       hydroxychloroquine (PLAQUENIL) 200 MG tablet Take 1 tablet by mouth 2 (two) times daily.       meloxicam (MOBIC) 7.5 MG tablet Take by mouth as needed.       methotrexate (RHEUMATREX) 2.5 MG tablet Take by mouth. 5 tablets        Omega-3 Fatty Acids (FISH OIL) 1000 MG CAPS Take 1 capsule by mouth daily.       vitamin C (ASCORBIC ACID) 500 MG tablet Take 500 mg by mouth daily.        No facility-administered medications prior to visit.        Orthostatic Vitals Recorded in This Encounter   03/29/2022 1346     BP Location: Left Arm  Cuff Size: Normal   Instructions   Return in about 4 months (around 07/29/2022).   Was appointment offered to patient (explain)?  No, she was just seen on 03/29/22   Reason for call: Cough and congestion started over a week or more ago.  The mucus is white and comes out of nose.  She denies any fever, chills or body aches.  She has been using the Symbicort and Trelegy.  She accidentally discarded the wrong Symbicort and now just has Trelegy.  I advised her that once she started Trelegy she was to stop  the Symbicort.  She verbalized understanding.  She says she has an upcoming surgery in January and does not want to be congested.  She is using her Albuterol inhaler 2-3 times per day for cough and sob.  She is requesting something be sent into her pharmacy.    Dr. Mortimer Fries, please advise.  Thank you.   (examples of things to ask: : When did symptoms start? Fever? Cough? Productive? Color to sputum? More sputum than usual? Wheezing? Have you needed increased oxygen? Are you taking your respiratory medications? What over the counter measures have you tried?)  No Known Allergies  Immunization History  Administered Date(s) Administered   Fluad Quad(high Dose 65+) 01/12/2022   Influenza Inj Mdck Quad Pf 02/22/2016   Influenza, High Dose Seasonal PF 02/25/2018   Influenza-Unspecified 02/17/2021   PFIZER Comirnaty(Gray Top)Covid-19 Tri-Sucrose Vaccine 06/21/2019, 11/29/2019, 06/04/2020   Pneumococcal Conjugate-13 01/23/2015   Pneumococcal Polysaccharide-23 02/22/2016   Pneumococcal-Unspecified 03/29/2007   Tdap 11/27/2017   Zoster, Live 10/06/2011

## 2022-04-12 MED ORDER — PREDNISONE 10 MG PO TABS: 10.0000 mg | ORAL_TABLET | Freq: Every day | ORAL | 0 refills | Status: DC

## 2022-04-12 MED ORDER — AZITHROMYCIN 250 MG PO TABS: 250.0000 mg | ORAL_TABLET | ORAL | 0 refills | Status: DC

## 2022-04-12 NOTE — Telephone Encounter (Signed)
Flora Lipps, MD  to [place], RN     04/11/22  2:34 PM  Pred 10 mg daily for 7 days Z pak  Recommend COVID/FLU/RSV testing  Will treat for infection  I called and spoke with the pt and notified of recommendations per Dr Mortimer Fries  She verbalized understanding  Rxs were sent to [facility]

## 2022-05-01 LAB — ACID FAST CULTURE WITH REFLEXED SENSITIVITIES (MYCOBACTERIA): Acid Fast Culture: NEGATIVE

## 2022-05-02 ENCOUNTER — Ambulatory Visit
Admission: RE | Admit: 2022-05-02 | Discharge: 2022-05-02 | Disposition: A | Discharge status: Home / Self Care | Payer: Medicare HMO | Source: Ambulatory Visit | Attending: Student in an Organized Health Care Education/Training Program | Admitting: Student in an Organized Health Care Education/Training Program

## 2022-05-02 DIAGNOSIS — R918 Other nonspecific abnormal finding of lung field: Secondary | ICD-10-CM | POA: Diagnosis not present

## 2022-05-05 ENCOUNTER — Other Ambulatory Visit: Payer: Self-pay

## 2022-05-05 ENCOUNTER — Encounter (HOSPITAL_COMMUNITY): Payer: Self-pay | Admitting: Student in an Organized Health Care Education/Training Program

## 2022-05-06 ENCOUNTER — Encounter (HOSPITAL_COMMUNITY)
Admission: RE | Disposition: A | Discharge status: Home / Self Care | Payer: Self-pay | Source: Ambulatory Visit | Attending: Student in an Organized Health Care Education/Training Program

## 2022-05-06 ENCOUNTER — Ambulatory Visit (HOSPITAL_COMMUNITY): Payer: Medicare HMO

## 2022-05-06 ENCOUNTER — Encounter (HOSPITAL_COMMUNITY): Payer: Self-pay | Admitting: Student in an Organized Health Care Education/Training Program

## 2022-05-06 ENCOUNTER — Ambulatory Visit (HOSPITAL_BASED_OUTPATIENT_CLINIC_OR_DEPARTMENT_OTHER): Payer: Medicare HMO | Admitting: Anesthesiology

## 2022-05-06 ENCOUNTER — Ambulatory Visit (HOSPITAL_COMMUNITY)
Admission: RE | Admit: 2022-05-06 | Discharge: 2022-05-06 | Disposition: A | Discharge status: Home / Self Care | Payer: Medicare HMO | Source: Ambulatory Visit | Attending: Student in an Organized Health Care Education/Training Program | Admitting: Student in an Organized Health Care Education/Training Program

## 2022-05-06 ENCOUNTER — Ambulatory Visit (HOSPITAL_COMMUNITY): Payer: Medicare HMO | Admitting: Anesthesiology

## 2022-05-06 DIAGNOSIS — J849 Interstitial pulmonary disease, unspecified: Secondary | ICD-10-CM

## 2022-05-06 DIAGNOSIS — J449 Chronic obstructive pulmonary disease, unspecified: Secondary | ICD-10-CM

## 2022-05-06 DIAGNOSIS — Z79631 Long term (current) use of antimetabolite agent: Secondary | ICD-10-CM | POA: Diagnosis not present

## 2022-05-06 DIAGNOSIS — Z7722 Contact with and (suspected) exposure to environmental tobacco smoke (acute) (chronic): Secondary | ICD-10-CM | POA: Insufficient documentation

## 2022-05-06 DIAGNOSIS — R918 Other nonspecific abnormal finding of lung field: Secondary | ICD-10-CM

## 2022-05-06 DIAGNOSIS — Z1152 Encounter for screening for COVID-19: Secondary | ICD-10-CM | POA: Insufficient documentation

## 2022-05-06 DIAGNOSIS — M1990 Unspecified osteoarthritis, unspecified site: Secondary | ICD-10-CM | POA: Diagnosis not present

## 2022-05-06 DIAGNOSIS — Z7951 Long term (current) use of inhaled steroids: Secondary | ICD-10-CM | POA: Insufficient documentation

## 2022-05-06 DIAGNOSIS — E669 Obesity, unspecified: Secondary | ICD-10-CM | POA: Diagnosis not present

## 2022-05-06 DIAGNOSIS — J9811 Atelectasis: Secondary | ICD-10-CM | POA: Diagnosis not present

## 2022-05-06 DIAGNOSIS — M069 Rheumatoid arthritis, unspecified: Secondary | ICD-10-CM | POA: Insufficient documentation

## 2022-05-06 HISTORY — PX: BRONCHIAL NEEDLE ASPIRATION BIOPSY: SHX5106

## 2022-05-06 HISTORY — PX: BRONCHIAL BIOPSY: SHX5109

## 2022-05-06 HISTORY — PX: VIDEO BRONCHOSCOPY WITH RADIAL ENDOBRONCHIAL ULTRASOUND: SHX6849

## 2022-05-06 LAB — BASIC METABOLIC PANEL
Anion gap: 8 (ref 5–15)
BUN: 19 mg/dL (ref 8–23)
CO2: 28 mmol/L (ref 22–32)
Calcium: 9.2 mg/dL (ref 8.9–10.3)
Chloride: 99 mmol/L (ref 98–111)
Creatinine, Ser: 0.73 mg/dL (ref 0.44–1.00)
GFR, Estimated: >60 mL/min (ref >60)
Glucose, Bld: 139 mg/dL — ABNORMAL HIGH (ref 70–99)
Potassium: 3.7 mmol/L (ref 3.5–5.1)
Sodium: 135 mmol/L (ref 135–145)

## 2022-05-06 LAB — CBC
HCT: 39 % (ref 36.0–46.0)
Hemoglobin: 12.8 g/dL (ref 12.0–15.0)
MCH: 31.1 pg (ref 26.0–34.0)
MCHC: 32.8 g/dL (ref 30.0–36.0)
MCV: 94.7 fL (ref 80.0–100.0)
Platelets: 222 10*3/uL (ref 150–400)
RBC: 4.12 MIL/uL (ref 3.87–5.11)
RDW: 13.9 % (ref 11.5–15.5)
WBC: 9.8 10*3/uL (ref 4.0–10.5)
nRBC: 0 % (ref 0.0–0.2)

## 2022-05-06 LAB — SARS CORONAVIRUS 2 BY RT PCR: SARS Coronavirus 2 by RT PCR: NEGATIVE

## 2022-05-06 SURGERY — BRONCHOSCOPY, WITH BIOPSY USING ELECTROMAGNETIC NAVIGATION
Anesthesia: General | Laterality: Bilateral

## 2022-05-06 MED ORDER — FENTANYL CITRATE (PF) 100 MCG/2ML IJ SOLN: 25.0000 ug | INTRAMUSCULAR | Status: DC | PRN

## 2022-05-06 MED ORDER — ONDANSETRON HCL 4 MG/2ML IJ SOLN: 4.0000 mg | Freq: Once | INTRAMUSCULAR | Status: DC | PRN

## 2022-05-06 MED ORDER — FENTANYL CITRATE (PF) 100 MCG/2ML IJ SOLN
INTRAMUSCULAR | Status: DC | PRN
  Administered 2022-05-06: 100 ug via INTRAVENOUS

## 2022-05-06 MED ORDER — ROCURONIUM BROMIDE 10 MG/ML (PF) SYRINGE
PREFILLED_SYRINGE | INTRAVENOUS | Status: DC | PRN
  Administered 2022-05-06: 15 mg via INTRAVENOUS
  Administered 2022-05-06: 40 mg via INTRAVENOUS
  Administered 2022-05-06 (×2): 10 mg via INTRAVENOUS

## 2022-05-06 MED ORDER — IPRATROPIUM-ALBUTEROL 0.5-2.5 (3) MG/3ML IN SOLN: 3.0000 mL | Freq: Once | RESPIRATORY_TRACT | Status: AC

## 2022-05-06 MED ORDER — LIDOCAINE HCL (PF) 1 % IJ SOLN
INTRAMUSCULAR | Status: AC
  Filled 2022-05-06: qty 30

## 2022-05-06 MED ORDER — IPRATROPIUM-ALBUTEROL 0.5-2.5 (3) MG/3ML IN SOLN
RESPIRATORY_TRACT | Status: AC
  Administered 2022-05-06: 3 mL via RESPIRATORY_TRACT
  Filled 2022-05-06: qty 3

## 2022-05-06 MED ORDER — LIDOCAINE HCL 2 % IJ SOLN
INTRAMUSCULAR | Status: DC | PRN
  Administered 2022-05-06: 3 mL

## 2022-05-06 MED ORDER — ONDANSETRON HCL 4 MG/2ML IJ SOLN
INTRAMUSCULAR | Status: DC | PRN
  Administered 2022-05-06: 4 mg via INTRAVENOUS

## 2022-05-06 MED ORDER — PROPOFOL 10 MG/ML IV BOLUS
INTRAVENOUS | Status: DC | PRN
  Administered 2022-05-06: 100 mg via INTRAVENOUS

## 2022-05-06 MED ORDER — ACETAMINOPHEN 10 MG/ML IV SOLN: 1000.0000 mg | Freq: Once | INTRAVENOUS | Status: DC | PRN

## 2022-05-06 MED ORDER — DEXAMETHASONE SODIUM PHOSPHATE 10 MG/ML IJ SOLN
INTRAMUSCULAR | Status: DC | PRN
  Administered 2022-05-06: 10 mg via INTRAVENOUS

## 2022-05-06 MED ORDER — SUGAMMADEX SODIUM 200 MG/2ML IV SOLN
INTRAVENOUS | Status: DC | PRN
  Administered 2022-05-06: 200 mg via INTRAVENOUS

## 2022-05-06 MED ORDER — AMISULPRIDE (ANTIEMETIC) 5 MG/2ML IV SOLN: 10.0000 mg | Freq: Once | INTRAVENOUS | Status: DC | PRN

## 2022-05-06 MED ORDER — LIDOCAINE 2% (20 MG/ML) 5 ML SYRINGE
INTRAMUSCULAR | Status: DC | PRN
  Administered 2022-05-06: 60 mg via INTRAVENOUS

## 2022-05-06 MED ORDER — PHENYLEPHRINE HCL-NACL 20-0.9 MG/250ML-% IV SOLN
INTRAVENOUS | Status: DC | PRN
  Administered 2022-05-06: 25 ug/min via INTRAVENOUS

## 2022-05-06 MED ORDER — CHLORHEXIDINE GLUCONATE 0.12 % MT SOLN
OROMUCOSAL | Status: AC
  Administered 2022-05-06: 15 mL
  Filled 2022-05-06: qty 15

## 2022-05-06 MED ORDER — PROPOFOL 500 MG/50ML IV EMUL
INTRAVENOUS | Status: DC | PRN
  Administered 2022-05-06 (×3): 125 ug/kg/min via INTRAVENOUS

## 2022-05-06 MED ORDER — LACTATED RINGERS IV SOLN: INTRAVENOUS | Status: DC

## 2022-05-06 MED ORDER — PHENYLEPHRINE 80 MCG/ML (10ML) SYRINGE FOR IV PUSH (FOR BLOOD PRESSURE SUPPORT)
PREFILLED_SYRINGE | INTRAVENOUS | Status: DC | PRN
  Administered 2022-05-06 (×2): 80 ug via INTRAVENOUS

## 2022-05-06 MED ORDER — PREDNISONE 20 MG PO TABS: 20.0000 mg | ORAL_TABLET | Freq: Every day | ORAL | Status: AC

## 2022-05-06 NOTE — Anesthesia Procedure Notes (Signed)
Procedure Name: Intubation Date/Time: 05/06/2022 9:35 AM  Performed by: Harden Mo, CRNAPre-anesthesia Checklist: Patient identified, Emergency Drugs available, Suction available and Patient being monitored Patient Re-evaluated:Patient Re-evaluated prior to induction Oxygen Delivery Method: Circle System Utilized Preoxygenation: Pre-oxygenation with 100% oxygen Induction Type: IV induction Ventilation: Mask ventilation without difficulty and Oral airway inserted - appropriate to patient size Laryngoscope Size: McGraph and 3 Grade View: Grade I Tube type: Oral Tube size: 8.0 mm Number of attempts: 1 Airway Equipment and Method: Stylet and Oral airway Placement Confirmation: ETT inserted through vocal cords under direct vision, positive ETCO2 and breath sounds checked- equal and bilateral Secured at: 21 cm Tube secured with: Tape Dental Injury: Teeth and Oropharynx as per pre-operative assessment

## 2022-05-06 NOTE — Op Note (Signed)
Video Bronchoscopy with Robotic Assisted Bronchoscopic Navigation   Date of Operation: 05/06/2022   Pre-op Diagnosis: Pulmonary Masses  Post-op Diagnosis: Pulmonary Masses  Surgeon: Armando Reichert, MD  Assistants: Baltazar Apo, MD  Anesthesia: General endotracheal anesthesia  Operation: Flexible video fiberoptic bronchoscopy with robotic assistance and biopsies.  Estimated Blood Loss: Minimal  Complications: None  Indications and History: Kim Bentley is a 82 y.o. female with history of pulmonary nodules presenting for biopsy. The risks, benefits, complications, treatment options and expected outcomes were discussed with the patient.  The possibilities of pneumothorax, pneumonia, reaction to medication, pulmonary aspiration, perforation of a viscus, bleeding, failure to diagnose a condition and creating a complication requiring transfusion or operation were discussed with the patient who freely signed the consent.    Description of Procedure: The patient was seen in the Preoperative Area, was examined and was deemed appropriate to proceed.  The patient was taken to [facility] endoscopy room, identified as Kim Bentley and the procedure verified as Flexible Video Fiberoptic Bronchoscopy.  A Time Out was held and the above information confirmed.   Prior to the date of the procedure a high-resolution CT scan of the chest was performed. Utilizing ION software program a virtual tracheobronchial tree was generated to allow the creation of distinct navigation pathways to the patient's parenchymal abnormalities. After being taken to the operating room general anesthesia was initiated and the patient  was orally intubated. The video fiberoptic bronchoscope was introduced via the endotracheal tube and a general inspection was performed which showed normal right and left lung anatomy, aspiration of the bilateral mainstems was completed to remove any remaining secretions. Robotic catheter inserted into  patient's endotracheal tube.   Target #1 RLL: The distinct navigation pathways prepared prior to this procedure were then utilized to navigate to patient's lesion identified on CT scan. The robotic catheter was secured into place and the vision probe was withdrawn.  Lesion location was approximated using fluoroscopy and radial endobronchial ultrasound for peripheral targeting. We then utilized the CiOS cone beam CT to visualize the needle and correct for divergence with the navigational map. Under fluoroscopic guidance transbronchial needle biopsies and transbronchial forceps biopsies were performed to be sent for culture (AFB, fungal, bacterial) cytology and pathology.  Target #2 RUL: The distinct navigation pathways prepared prior to this procedure were then utilized to navigate to patient's lesion identified on CT scan. The robotic catheter was secured into place and the vision probe was withdrawn.  Lesion location was approximated using fluoroscopy and radial endobronchial ultrasound for peripheral targeting. We then utilized the CiOS cone beam CT to visualize the needle and correct for divergence with the navigational map. Under fluoroscopic guidance transbronchial needle biopsies and transbronchial forceps biopsies were performed to be sent for culture (AFB, fungal, bacterial), cytology and pathology.  At the end of the procedure a general airway inspection was performed and there was no evidence of active bleeding. The bronchoscope was removed.  The patient tolerated the procedure well. There was no significant blood loss and there were no obvious complications. A post-procedural chest x-ray is pending.  Samples Target #1: 1. Transbronchial needle biopsies from RLL 2. Transbronchial forceps biopsies from the RLL  Samples Target #2: 1. Transbronchial needle biopsies from RUL 2. Transbronchial forceps biopsies from the RUL  Plans:  The patient will be discharged from the PACU to home when  recovered from anesthesia and after chest x-ray is reviewed. We will review the cytology, pathology and microbiology results with the  patient when they become available. Outpatient followup will be with me.  Armando Reichert, MD [facility] 05/06/2022 11:29 AM

## 2022-05-06 NOTE — Transfer of Care (Signed)
Immediate Anesthesia Transfer of Care Note  Patient: Kim Bentley  Procedure(s) Performed: ROBOTIC ASSISTED NAVIGATIONAL BRONCHOSCOPY (Bilateral) VIDEO BRONCHOSCOPY WITH RADIAL ENDOBRONCHIAL ULTRASOUND BRONCHIAL BIOPSIES BRONCHIAL NEEDLE ASPIRATION BIOPSIES  Patient Location: PACU  Anesthesia Type:General  Level of Consciousness: awake and drowsy  Airway & Oxygen Therapy: Patient Spontanous Breathing and Patient connected to face mask oxygen  Post-op Assessment: Report given to RN, Post -op Vital signs reviewed and stable, and Patient moving all extremities X 4  Post vital signs: Reviewed and stable  Last Vitals:  Vitals Value Taken Time  BP 130/59 05/06/22 1139  Temp    Pulse 91 05/06/22 1144  Resp 24 05/06/22 1144  SpO2 99 % 05/06/22 1144  Vitals shown include unvalidated device data.  Last Pain:  Vitals:   05/06/22 0652  TempSrc:   PainSc: 0-No pain         Complications: No notable events documented.

## 2022-05-06 NOTE — H&P (Signed)
NAME:  Kim Bentley, MRN:  [id number], DOB:  December 10, 1940, LOS: 0 ADMISSION DATE:  05/06/2022, CHIEF COMPLAINT:  Pulmonary Nodule   History of Present Illness:  Kim Bentley is a pleasant 82 year old female patient who is presenting for robotic assisted navigational bronchoscopy of pulmonary masses.  She was incidentally found to have a pulmonary mass after presenting with shortness of breath following a fall. She underwent a CT scan of the chest for evaluation of her shortness of breath by Dr. Humphrey Bentley which was notable for a large rounded consolidation in the RUL and RLL. She then underwent a PET/CT showing low FDG avidity in both masses. The CT scan was also notable for lymphadenopathy (4R and 7 on my review). She was referred to oncology and saw Dr. Rogue Bentley and was subsequently presented in tumor board and was subsequently referred for biopsy.   She underwent robotic assisted navigational bronchoscopy on 01/21/2022 to the RLL mass, in addition EBUS to multiple stations. The results from the biopsy has returned benign, with cultures growing Kim Bentley. Similarly, result from the EBUS has been negative for malignancy.  She reports feeling well and has no symptoms at the moment. She is a non-smoker. Patient medical history is notable for rheumatoid arthritis for which she is on methotrexate.    Objective   Blood pressure 129/63, pulse 77, temperature 98.4 F (36.9 C), temperature source Oral, resp. rate 18, height '5\' 1"'$  (1.549 m), weight 76.7 kg, SpO2 93 %.       No intake or output data in the 24 hours ending 05/06/22 0827 Filed Weights   05/06/22 0617  Weight: 76.7 kg    Examination: Physical Exam Constitutional:      General: She is not in acute distress.    Appearance: She is obese. She is not ill-appearing.  HENT:     Head: Normocephalic.     Mouth/Throat:     Mouth: Mucous membranes are moist.  Eyes:     Extraocular Movements: Extraocular movements intact.   Cardiovascular:     Rate and Rhythm: Normal rate and regular rhythm.     Heart sounds: Normal heart sounds.  Pulmonary:     Effort: Pulmonary effort is normal.     Breath sounds: Normal breath sounds. No wheezing or rales.  Abdominal:     General: There is distension.     Palpations: Abdomen is soft.  Skin:    General: Skin is warm.  Neurological:     General: No focal deficit present.     Mental Status: She is alert and oriented to person, place, and time. Mental status is at baseline.     Assessment & Plan:   #Pulmonary Mass   Presented with both RUL and RLL masses that are mildly FDG avid with associated lymphadenopathy. The differential includes malignancy, infection, organizing pneumonia, or other inflammatory condition. Patient will undergo robotic assisted navigational bronchoscopy with EBUS for sampling of masses and lymphadenopathy. She is appropriate for the procedure this morning.   Labs   CBC: Recent Labs  Lab 05/06/22 0601  WBC 9.8  HGB 12.8  HCT 39.0  MCV 94.7  PLT 106    Basic Metabolic Panel: Recent Labs  Lab 05/06/22 0601  NA 135  K 3.7  CL 99  CO2 28  GLUCOSE 139*  BUN 19  CREATININE 0.73  CALCIUM 9.2   GFR: Estimated Creatinine Clearance: 52.6 mL/min (by C-G formula based on SCr of 0.73 mg/dL). Recent Labs  Lab 05/06/22  0601  WBC 9.8    Liver Function Tests: No results for input(s): "AST", "ALT", "ALKPHOS", "BILITOT", "PROT", "ALBUMIN" in the last 168 hours. No results for input(s): "LIPASE", "AMYLASE" in the last 168 hours. No results for input(s): "AMMONIA" in the last 168 hours.  ABG No results found for: "PHART", "PCO2ART", "PO2ART", "HCO3", "TCO2", "ACIDBASEDEF", "O2SAT"   Coagulation Profile: No results for input(s): "INR", "PROTIME" in the last 168 hours.  Cardiac Enzymes: No results for input(s): "CKTOTAL", "CKMB", "CKMBINDEX", "TROPONINI" in the last 168 hours.  HbA1C: No results found for: "HGBA1C"  CBG: No  results for input(s): "GLUCAP" in the last 168 hours.  Past Medical History:  She,  has a past medical history of Allergic genetic state, Arthritis, Bladder spasms, COPD (chronic obstructive pulmonary disease) ([place]), COVID (2021), Dyspnea, GERD (gastroesophageal reflux disease), History of kidney stones, HLD (hyperlipidemia), Pneumonia, and Pulmonary mass.   Surgical History:   Past Surgical History:  Procedure Laterality Date   COLONOSCOPY     COLONOSCOPY N/A 09/27/2021   Procedure: COLONOSCOPY;  Surgeon: Kim Rubenstein, MD;  Location[facility] ENDOSCOPY;  Service: Endoscopy;  Laterality: N/A;   COLONOSCOPY WITH PROPOFOL N/A 03/26/2018   Procedure: COLONOSCOPY WITH PROPOFOL;  Surgeon: Kim Sails, MD;  Location: [facility] ENDOSCOPY;  Service: Endoscopy;  Laterality: N/A;   ESOPHAGOGASTRODUODENOSCOPY     HERNIA REPAIR     TONSILLECTOMY       Social History:   reports that she has never smoked. She has been exposed to tobacco smoke. She has never used smokeless tobacco. She reports that she does not drink alcohol and does not use drugs.   Family History:  Her family history includes Brain cancer in her brother; Breast cancer (age of onset: 7) in her sister; Diabetes in her mother; Healthy in her brother; Obesity in her mother.   Allergies No Known Allergies   Home Medications  Prior to Admission medications   Medication Sig Start Date End Date Taking? Authorizing Provider  acetaminophen (TYLENOL) 500 MG tablet Take 500-1,000 mg by mouth every 8 (eight) hours as needed (pain.).   Yes [provider]  albuterol (VENTOLIN HFA) 108 (90 Base) MCG/ACT inhaler Inhale 2 puffs into the lungs every 6 (six) hours as needed for wheezing or shortness of breath. 11/11/21  Yes Kim Divine, MD  Ascorbic Acid (VITAMIN C PO) Take 2,000 mg by mouth in the morning.   Yes [provider]  aspirin 81 MG EC tablet Take 81 mg by mouth in the morning.   Yes [provider]   budesonide-formoterol (SYMBICORT) 80-4.5 MCG/ACT inhaler Inhale 2 puffs into the lungs every 4 (four) hours as needed. Patient taking differently: Inhale 2 puffs into the lungs every 4 (four) hours as needed (wheezing/respiratory issues.). 03/08/22  Yes Penina Reisner, Berdine Addison, MD  Calcium Carb-Cholecalciferol (CALTRATE 600+D3 PO) Take 1 tablet by mouth in the morning.   Yes [provider]  Cholecalciferol (VITAMIN D-3) 125 MCG (5000 UT) TABS Take 5,000 Units by mouth in the morning.   Yes [provider]  Cyanocobalamin (VITAMIN B-12) 2500 MCG SUBL Take 2,500 mcg by mouth in the morning.   Yes [provider]  Fluticasone-Umeclidin-Vilant (TRELEGY ELLIPTA) 100-62.5-25 MCG/ACT AEPB Inhale 1 puff into the lungs daily. 02/08/22  Yes Asencion Guisinger, Berdine Addison, MD  folic acid (FOLVITE) 1 MG tablet Take 1 mg by mouth in the morning. 09/02/21 09/02/22 Yes [provider]  guaiFENesin (MUCINEX FAST-MAX CHEST CONG MS) 100 MG/5ML liquid Take 10-15 mLs by mouth  every 4 (four) hours as needed for cough or to loosen phlegm.   Yes [provider]  hydroxychloroquine (PLAQUENIL) 200 MG tablet Take 200 mg by mouth 2 (two) times daily. 10/15/20  Yes [provider]  levocetirizine (XYZAL) 5 MG tablet Take 1 tablet (5 mg total) by mouth every evening. 03/29/22 03/29/23 Yes Sarahi Borland, Berdine Addison, MD  methotrexate (RHEUMATREX) 2.5 MG tablet Take 12.5 mg by mouth every Friday. 10/15/20  Yes [provider]  naproxen sodium (ALEVE) 220 MG tablet Take 440 mg by mouth daily as needed (pain.).   Yes [provider]  Omega-3 Fatty Acids (FISH OIL PO) Take 1 capsule by mouth in the morning.   Yes [provider]  Potassium 99 MG TABS Take 99 mg by mouth in the morning.   Yes [provider]  valACYclovir (VALTREX) 1000 MG tablet Take 1,000-2,000 mg by mouth 2 (two) times daily as needed (cold sore/fever blister). 04/20/22  Yes [provider]  zinc sulfate  220 (50 Zn) MG capsule Take 220 mg by mouth in the morning.   Yes [provider]  azithromycin (ZITHROMAX) 250 MG tablet Take 1 tablet (250 mg total) by mouth as directed. Patient not taking: Reported on 05/03/2022 04/12/22   Flora Lipps, MD  predniSONE (DELTASONE) 10 MG tablet Take 1 tablet (10 mg total) by mouth daily with breakfast. Patient not taking: Reported on 05/03/2022 04/12/22   Flora Lipps, MD    Armando Reichert, MD [facility] 05/06/2022 8:30 AM

## 2022-05-06 NOTE — Discharge Instructions (Addendum)
I have sent you a short course of prednisone to take after the procedure. Take one pill of prednisone (20 mg) daily for 5 days starting tomorrow, Saturday 05/07/2022.

## 2022-05-06 NOTE — Anesthesia Preprocedure Evaluation (Addendum)
Anesthesia Evaluation  Patient identified by MRN, date of birth, ID band Patient awake    Reviewed: Allergy & Precautions, NPO status , Patient's Chart, lab work & pertinent test results  Airway Mallampati: IV  TM Distance: >3 FB Neck ROM: Full    Dental no notable dental hx.    Pulmonary COPD,  COPD inhaler ILD (interstitial lung disease)   Pulmonary exam normal        Cardiovascular negative cardio ROS Normal cardiovascular exam     Neuro/Psych negative neurological ROS  negative psych ROS   GI/Hepatic negative GI ROS, Neg liver ROS,,,  Endo/Other  negative endocrine ROS    Renal/GU negative Renal ROS     Musculoskeletal  (+) Arthritis ,    Abdominal   Peds  Hematology negative hematology ROS (+)   Anesthesia Other Findings Right lower lobe lung mass Pulmonary nodules  Reproductive/Obstetrics                             Anesthesia Physical Anesthesia Plan  ASA: 3  Anesthesia Plan: General   Post-op Pain Management:    Induction: Intravenous  PONV Risk Score and Plan: 3 and Ondansetron, Dexamethasone, Propofol infusion and Treatment may vary due to age or medical condition  Airway Management Planned: Oral ETT and Video Laryngoscope Planned  Additional Equipment:   Intra-op Plan:   Post-operative Plan: Extubation in OR  Informed Consent: I have reviewed the patients History and Physical, chart, labs and discussed the procedure including the risks, benefits and alternatives for the proposed anesthesia with the patient or authorized representative who has indicated his/her understanding and acceptance.     Dental advisory given  Plan Discussed with: CRNA  Anesthesia Plan Comments: (Request for an 8.0 oral ETT)       Anesthesia Quick Evaluation

## 2022-05-07 MED ORDER — PREDNISONE 20 MG PO TABS: ORAL_TABLET | ORAL | 0 refills | Status: DC

## 2022-05-07 NOTE — Telephone Encounter (Signed)
Sent to [place] on [facility] read- prednisone for patient as an on call request - it had already been sent  but for some reason had not gone through.

## 2022-05-08 ENCOUNTER — Encounter (HOSPITAL_COMMUNITY): Payer: Self-pay | Admitting: Student in an Organized Health Care Education/Training Program

## 2022-05-08 LAB — ACID FAST SMEAR (AFB, MYCOBACTERIA): Acid Fast Smear: NEGATIVE

## 2022-05-08 NOTE — Anesthesia Postprocedure Evaluation (Signed)
Anesthesia Post Note  Patient: Kim Bentley  Procedure(s) Performed: ROBOTIC ASSISTED NAVIGATIONAL BRONCHOSCOPY (Bilateral) VIDEO BRONCHOSCOPY WITH RADIAL ENDOBRONCHIAL ULTRASOUND BRONCHIAL BIOPSIES BRONCHIAL NEEDLE ASPIRATION BIOPSIES     Patient location during evaluation: PACU Anesthesia Type: General Level of consciousness: awake Pain management: pain level controlled Vital Signs Assessment: post-procedure vital signs reviewed and stable Respiratory status: spontaneous breathing, nonlabored ventilation and respiratory function stable Cardiovascular status: blood pressure returned to baseline and stable Postop Assessment: no apparent nausea or vomiting Anesthetic complications: no   No notable events documented.  Last Vitals:  Vitals:   05/06/22 1230 05/06/22 1245  BP: (!) 110/52 (!) 117/53  Pulse: 87 86  Resp: (!) 22 20  Temp:  36.4 C  SpO2: 94% 94%    Last Pain:  Vitals:   05/06/22 1245  TempSrc:   PainSc: 0-No pain                 Yalda Herd P Temperance Kelemen

## 2022-05-10 LAB — CYTOLOGY - NON PAP

## 2022-05-11 ENCOUNTER — Telehealth: Payer: Self-pay | Admitting: Student in an Organized Health Care Education/Training Program

## 2022-05-11 LAB — AEROBIC/ANAEROBIC CULTURE W GRAM STAIN (SURGICAL/DEEP WOUND)
Culture: NO GROWTH
Gram Stain: NONE SEEN

## 2022-05-11 NOTE — Telephone Encounter (Signed)
Spoke to [facility] with [place] [facility]. She stated that they received positive results for haemophilus influenza. Fonder is questioning what the tx plan will be and if patient is currently having sx. Per last OV note, patient did not have acute sx.   Dr. Genia Harold, please advise. thanks

## 2022-05-12 ENCOUNTER — Other Ambulatory Visit: Payer: Self-pay | Admitting: Student in an Organized Health Care Education/Training Program

## 2022-05-12 DIAGNOSIS — A492 Hemophilus influenzae infection, unspecified site: Secondary | ICD-10-CM

## 2022-05-12 DIAGNOSIS — R918 Other nonspecific abnormal finding of lung field: Secondary | ICD-10-CM

## 2022-05-12 MED ORDER — AMOXICILLIN-POT CLAVULANATE 875-125 MG PO TABS: 1.0000 | ORAL_TABLET | Freq: Two times a day (BID) | ORAL | 0 refills | Status: AC

## 2022-05-12 NOTE — Progress Notes (Signed)
Spoke to patient, gave her results from her bronchoscopy. Culture showing scant H. Influenza, will send a script for Augmentin.

## 2022-05-12 NOTE — Telephone Encounter (Signed)
I did, I will try again after clinic.

## 2022-05-12 NOTE — Telephone Encounter (Signed)
Dr. Genia Harold, did you call patient?

## 2022-05-12 NOTE — Telephone Encounter (Signed)
Fonda with [facility] is aware that Dr. Genia Harold prescribed Augmentin. She voiced her understanding and had no further questions.  Nothing further needed.

## 2022-05-12 NOTE — Telephone Encounter (Signed)
Patient states she received a call from the office so she is returning it- please advise.

## 2022-05-13 LAB — ACID FAST SMEAR (AFB, MYCOBACTERIA): Acid Fast Smear: NEGATIVE

## 2022-05-19 LAB — AEROBIC/ANAEROBIC CULTURE W GRAM STAIN (SURGICAL/DEEP WOUND): Gram Stain: NONE SEEN

## 2022-05-20 ENCOUNTER — Telehealth: Payer: Self-pay | Admitting: Student in an Organized Health Care Education/Training Program

## 2022-05-20 NOTE — Telephone Encounter (Signed)
PT is at the end of her Antibx sent in by Dr. Genia Harold but still not completley well. Wanted to call and let him know. Please call PT to advise.  [phone]

## 2022-05-20 NOTE — Telephone Encounter (Signed)
I notified the patient. Nothing further is needed. 

## 2022-05-20 NOTE — Telephone Encounter (Signed)
Prednisone 10 mg    2 daily x 3 days, 1 daily x 3 days and off

## 2022-05-20 NOTE — Telephone Encounter (Signed)
I spoke with the patient. She took the last dose of antibiotics yesterday. She said she is better but still has a cough with white to yellow sputum. No fevers, chill, sweats. She does still have a lot of nasal congestion.  She is using her inhalers.   She wants to know if she should take another round of antibiotics?

## 2022-05-20 NOTE — Telephone Encounter (Signed)
I notified the patient. She said she only go the antibiotic prescription on 05/08/2023. I called the pharmacy and they said she picked up the antibiotic and the Prednisone 2 tablets for 3 days and 1 tablet for 4 days script on 05/08/2023.  Do you want to send her in more Prednisone?

## 2022-05-20 NOTE — Telephone Encounter (Signed)
Dr. Melvyn Novas, please advise. Dr. Genia Harold is unavailable.

## 2022-05-20 NOTE — Telephone Encounter (Signed)
Take prednisone 10 mg daily x 5 more days   Unless mucus gets a lot nastier would hold further abx as risk of causing c  diff  or resistant organism

## 2022-05-21 ENCOUNTER — Telehealth: Payer: Self-pay | Admitting: Pulmonary Disease

## 2022-05-21 DIAGNOSIS — J441 Chronic obstructive pulmonary disease with (acute) exacerbation: Secondary | ICD-10-CM

## 2022-05-21 MED ORDER — PREDNISONE 10 MG PO TABS: ORAL_TABLET | ORAL | 0 refills | Status: AC

## 2022-05-21 NOTE — Telephone Encounter (Signed)
She called on 05/20/22 to get prednisone script sent.  Didn't get sent to her pharmacy.  I confirmed she would like script sent to [facility] on [address] in [place].  Sent script for prednisone 10 mg >> 2 pills for 3 days, 1 pill for 3 days.

## 2022-05-23 NOTE — Telephone Encounter (Signed)
Lm for patient.  

## 2022-05-23 NOTE — Telephone Encounter (Signed)
Appt scheduled 06/01/2022 at 11:45. Nothing further needed.

## 2022-05-26 DIAGNOSIS — Z79899 Other long term (current) drug therapy: Secondary | ICD-10-CM | POA: Diagnosis not present

## 2022-05-26 DIAGNOSIS — M0579 Rheumatoid arthritis with rheumatoid factor of multiple sites without organ or systems involvement: Secondary | ICD-10-CM | POA: Diagnosis not present

## 2022-05-27 LAB — CULTURE, FUNGUS WITHOUT SMEAR

## 2022-06-01 ENCOUNTER — Ambulatory Visit: Payer: Medicare HMO | Admitting: Student in an Organized Health Care Education/Training Program

## 2022-06-01 ENCOUNTER — Encounter: Payer: Self-pay | Admitting: Student in an Organized Health Care Education/Training Program

## 2022-06-01 VITALS — BP 130/70 | HR 76 | Temp 97.8°F | Ht 61.0 in | Wt 175.2 lb

## 2022-06-01 DIAGNOSIS — R918 Other nonspecific abnormal finding of lung field: Secondary | ICD-10-CM

## 2022-06-01 DIAGNOSIS — R053 Chronic cough: Secondary | ICD-10-CM

## 2022-06-01 LAB — MISCELLANEOUS TEST

## 2022-06-01 MED ORDER — FLUTICASONE PROPIONATE 50 MCG/ACT NA SUSP: 1.0000 | Freq: Every day | NASAL | 6 refills | Status: AC

## 2022-06-01 NOTE — Progress Notes (Signed)
Assessment & Plan:   #Pulmonary Mass   Presents with both RUL and RLL masses that are mildly FDG avid and with associated lymphadenopathy. She underwent robotic assisted navigational bronchoscopy to the RLL and RUL mass and cytology is benign. This is likely a benign mass/nodule in the setting of immune suppression. She has stopped her methotrexate and we will continue to monitor the masses for growth or change. Will likely order a repeat CT in one year.    #Shortness of breath   Patient has a history of obstruction on PFT's from 12/13/2021 with scooping of the expiratory flow volume loop. While her ratio is preserved (73%), she does have an impaired FEV1 of 1.19 at 68% predicted. 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 FVC is decreased and so is TLC, suggesting mild restriction. DLCO was within normal. CT high resolution on 11/23/2021 did not show any signs of ILD. 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 She is on Trelegy and as needed Symbicort. Today we discussed the utility of weight loss to help with symptoms.   #Chronic cough   Continues to have a chronic cough and urge to clear her throat. Very likely she has an upper respiratory cough syndrome further contributing to her symptoms that improved with second generation anti-histamines. I will add intra-nassal steroids to her regimen to help with her nasal congestion today.  - fluticasone (FLONASE) 50 MCG/ACT nasal spray; Place 1 spray into both nostrils daily.  Dispense: 18.2 mL; Refill: 6  Return in about 6 months (around 11/30/2022).  I spent 30 minutes caring for this patient today, including preparing to see the patient, obtaining a medical history , reviewing a separately obtained history, performing a medically appropriate examination and/or evaluation, counseling and educating the patient/family/caregiver, ordering medications, tests, or procedures, and documenting  clinical information in the electronic health record  Armando Reichert, MD [facility] 06/01/2022 5:04 PM    End of visit medications:  Meds ordered this encounter  Medications   fluticasone (FLONASE) 50 MCG/ACT nasal spray    Sig: Place 1 spray into both nostrils daily.    Dispense:  18.2 mL    Refill:  6     Current Outpatient Medications:    acetaminophen (TYLENOL) 500 MG tablet, Take 500-1,000 mg by mouth every 8 (eight) hours as needed (pain.)., Disp: , Rfl:    albuterol (VENTOLIN HFA) 108 (90 Base) MCG/ACT inhaler, Inhale 2 puffs into the lungs every 6 (six) hours as needed for wheezing or shortness of breath., Disp: 8 g, Rfl: 0   Ascorbic Acid (VITAMIN C PO), Take 2,000 mg by mouth in the morning., Disp: , Rfl:    aspirin 81 MG EC tablet, Take 81 mg by mouth in the morning., Disp: , Rfl:    budesonide-formoterol (SYMBICORT) 80-4.5 MCG/ACT inhaler, Inhale 2 puffs into the lungs every 4 (four) hours as needed. (Patient taking differently: Inhale 2 puffs into the lungs every 4 (four) hours as needed (wheezing/respiratory issues.).), Disp: 1 each, Rfl: 12   Calcium Carb-Cholecalciferol (CALTRATE 600+D3 PO), Take 1 tablet by mouth in the morning., Disp: , Rfl:    Cholecalciferol (VITAMIN D-3) 125 MCG (5000 UT) TABS, Take 5,000 Units by mouth in the morning., Disp: , Rfl:    Cyanocobalamin (VITAMIN B-12) 2500 MCG SUBL, Take 2,500 mcg by mouth in the morning., Disp: , Rfl:    fluticasone (FLONASE) 50 MCG/ACT nasal spray, Place 1 spray into both  nostrils daily., Disp: 18.2 mL, Rfl: 6   Fluticasone-Umeclidin-Vilant (TRELEGY ELLIPTA) 100-62.5-25 MCG/ACT AEPB, Inhale 1 puff into the lungs daily., Disp: 1 each, Rfl: 11   guaiFENesin (MUCINEX FAST-MAX CHEST CONG MS) 100 MG/5ML liquid, Take 10-15 mLs by mouth every 4 (four) hours as needed for cough or to loosen phlegm., Disp: , Rfl:    hydroxychloroquine (PLAQUENIL) 200 MG tablet, Take 200 mg by mouth 2 (two) times daily.,  Disp: , Rfl:    levocetirizine (XYZAL) 5 MG tablet, Take 1 tablet (5 mg total) by mouth every evening., Disp: 30 tablet, Rfl: 11   naproxen sodium (ALEVE) 220 MG tablet, Take 440 mg by mouth daily as needed (pain.)., Disp: , Rfl:    Omega-3 Fatty Acids (FISH OIL PO), Take 1 capsule by mouth in the morning., Disp: , Rfl:    Potassium 99 MG TABS, Take 99 mg by mouth in the morning., Disp: , Rfl:    valACYclovir (VALTREX) 1000 MG tablet, Take 1,000-2,000 mg by mouth 2 (two) times daily as needed (cold sore/fever blister)., Disp: , Rfl:    zinc sulfate 220 (50 Zn) MG capsule, Take 220 mg by mouth in the morning., Disp: , Rfl:    folic acid (FOLVITE) 1 MG tablet, Take 1 mg by mouth in the morning. (Patient not taking: Reported on 06/01/2022), Disp: , Rfl:    methotrexate (RHEUMATREX) 2.5 MG tablet, Take 12.5 mg by mouth every Friday. (Patient not taking: Reported on 06/01/2022), Disp: , Rfl:    Subjective:   PATIENT ID: Kim Bentley GENDER: female DOB: 03/19/1941, MRN: [id number]  Chief Complaint  Patient presents with   Follow-up    SOB with exertion, prod cough with yellow sputum and wheezing.     HPI  Kim Bentley is a pleasant 82 year old female patient who is presenting to clinic for follow up. I had seen her for a pulmonary mass s/p biopsy as well as symptoms of cough and shortness of breath.  Since our last visit she has underwent repeat robotic assisted navigational bronchoscopy with biopsy of masses in her RUL and RLL. The pathology result has returned benign, and culture has grown H. Influenza, now s/p a course of antibiotics. She was incidentally found to have a pulmonary mass after presenting with shortness of breath following a fall. She then underwent a PET/CT showing low FDG avidity in both masses. The CT scan was also notable for lymphadenopathy (4R and 7 on my review). She was referred to oncology and saw Dr. Rogue Bussing and was subsequently presented in tumor board and was subsequently  referred for biopsy.   She underwent robotic assisted navigational bronchoscopy on 01/21/2022 to the RLL mass, in addition EBUS to multiple stations. The results from the biopsy has returned benign, with cultures growing Ralstonia Picketti. Similarly, result from the EBUS has been negative for malignancy. Repeat Navigational Bronchoscopy on 05/06/2022 showed benign cytology and cultures with H. Influenza. EBUS was not repeated during said procedure.   She reports feeling well but is complaining of a continued runny nose, some wheezing, and the occasional cough. The cough improves when taking her allergy pills. She is compliant with Trelegy as well as Symbicort. Shortness of breath remains improved.  In regards to her RA, she has followed with rheumatology. Patient has stopped her methotrexate but is continued on Hydroxychloroquine. She is a non-smoker.  Ancillary information including prior medications, full medical/surgical/family/social histories, and PFTs (when available) are listed below and have been reviewed.   Review  of Systems  Constitutional:  Negative for chills and fever.  Respiratory:  Positive for cough, shortness of breath and wheezing.   Cardiovascular:  Negative for chest pain.  Skin:  Negative for rash.     Objective:   Vitals:   06/01/22 1143  BP: 130/70  Pulse: 76  Temp: 97.8 F (36.6 C)  TempSrc: Temporal  SpO2: 96%  Weight: 175 lb 3.2 oz (79.5 kg)  Height: '5\' 1"'$  (1.549 m)   96% on RA  BMI Readings from Last 3 Encounters:  06/01/22 33.10 kg/m  05/06/22 31.93 kg/m  03/29/22 32.31 kg/m   Wt Readings from Last 3 Encounters:  06/01/22 175 lb 3.2 oz (79.5 kg)  05/06/22 169 lb (76.7 kg)  03/29/22 171 lb (77.6 kg)    Physical Exam Vitals reviewed.  Constitutional:      Appearance: Normal appearance. She is obese.  HENT:     Nose: Nose normal.     Mouth/Throat:     Mouth: Mucous membranes are moist.  Eyes:     Pupils: Pupils are equal, round, and  reactive to light.  Cardiovascular:     Rate and Rhythm: Normal rate and regular rhythm.     Pulses: Normal pulses.     Heart sounds: Normal heart sounds.  Pulmonary:     Effort: Pulmonary effort is normal.     Breath sounds: Wheezing (faint end expiratory wheezing anteriorly) present. No rhonchi.  Abdominal:     General: There is distension.     Palpations: Abdomen is soft.  Musculoskeletal:     Cervical back: Normal range of motion.  Skin:    General: Skin is warm.  Neurological:     General: No focal deficit present.     Mental Status: She is alert and oriented to person, place, and time. Mental status is at baseline.     Ancillary Information    Past Medical History:  Diagnosis Date   Allergic genetic state    Arthritis    Bladder spasms    COPD (chronic obstructive pulmonary disease) ([place])    mild   COVID 2021   very mild   Dyspnea    GERD (gastroesophageal reflux disease)    History of kidney stones    HLD (hyperlipidemia)    Pneumonia    Pulmonary mass      Family History  Problem Relation Age of Onset   Diabetes Mother    Obesity Mother    Breast cancer Sister 68   Healthy Brother    Brain cancer Brother      Past Surgical History:  Procedure Laterality Date   BRONCHIAL BIOPSY  05/06/2022   Procedure: BRONCHIAL BIOPSIES;  Surgeon: Armando Reichert, MD;  Location: [place] [facility] ENDOSCOPY;  Service: Pulmonary;;   BRONCHIAL NEEDLE ASPIRATION BIOPSY  05/06/2022   Procedure: BRONCHIAL NEEDLE ASPIRATION BIOPSIES;  Surgeon: Armando Reichert, MD;  Location: [place] ENDOSCOPY;  Service: Pulmonary;;   COLONOSCOPY     COLONOSCOPY N/A 09/27/2021   Procedure: COLONOSCOPY;  Surgeon: Lesly Rubenstein, MD;  Location: [facility] ENDOSCOPY;  Service: Endoscopy;  Laterality: N/A;   COLONOSCOPY WITH PROPOFOL N/A 03/26/2018   Procedure: COLONOSCOPY WITH PROPOFOL;  Surgeon: Lollie Sails, MD;  Location: [facility] END[facility]COPY;  Service: Endoscopy;  Laterality: N/A;   ESOPHAGOGASTRODUODENOSCOPY      HERNIA REPAIR     TONSILLECTOMY     VIDEO BRONCHOSCOPY WITH RADIAL ENDOBRONCHIAL ULTRASOUND  05/06/2022   Procedure: VIDEO BRONCHOSCOPY WITH RADIAL ENDOBRONCHIAL ULTRASOUND;  Surgeon: Armando Reichert, MD;  Location: [facility] ENDOSCOPY;  Service: Pulmonary;;    Social History   Socioeconomic History   Marital status: Widowed    Spouse name: Not on file   Number of children: Not on file   Years of education: Not on file   Highest education level: Not on file  Occupational History   Not on file  Tobacco Use   Smoking status: Never    Passive exposure: Past   Smokeless tobacco: Never  Vaping Use   Vaping Use: Never used  Substance and Sexual Activity   Alcohol use: Never   Drug use: Never   Sexual activity: Not on file  Other Topics Concern   Not on file  Social History Narrative   Lives alone, grandchild lives with her   Social Determinants of Health   Financial Resource Strain: Not on file  Food Insecurity: Not on file  Transportation Needs: No Transportation Needs (12/31/2021)   PRAPARE - Hydrologist (Medical): No    Lack of Transportation (Non-Medical): No  Physical Activity: Not on file  Stress: Not on file  Social Connections: Not on file  Intimate Partner Violence: Not on file     No Known Allergies   CBC    Component Value Date/Time   WBC 9.8 05/06/2022 0601   RBC 4.12 05/06/2022 0601   HGB 12.8 05/06/2022 0601   HGB 12.4 10/27/2011 0217   HCT 39.0 05/06/2022 0601   HCT 36.8 10/27/2011 0217   PLT 222 05/06/2022 0601   PLT 182 10/27/2011 0217   MCV 94.7 05/06/2022 0601   MCV 91 10/27/2011 0217   MCH 31.1 05/06/2022 0601   MCHC 32.8 05/06/2022 0601   RDW 13.9 05/06/2022 0601   RDW 12.9 10/27/2011 0217   LYMPHSABS 1.9 01/12/2022 1235   LYMPHSABS 2.3 10/27/2011 0217   MONOABS 0.5 01/12/2022 1235   MONOABS 0.7 10/27/2011 0217   EOSABS 0.2 01/12/2022 1235   EOSABS 0.2 10/27/2011 0217   BASOSABS 0.1 01/12/2022 1235   BASOSABS 0.0  10/27/2011 0217    Pulmonary Functions Testing Results:     No data to display          Outpatient Medications Prior to Visit  Medication Sig Dispense Refill   acetaminophen (TYLENOL) 500 MG tablet Take 500-1,000 mg by mouth every 8 (eight) hours as needed (pain.).     albuterol (VENTOLIN HFA) 108 (90 Base) MCG/ACT inhaler Inhale 2 puffs into the lungs every 6 (six) hours as needed for wheezing or shortness of breath. 8 g 0   Ascorbic Acid (VITAMIN C PO) Take 2,000 mg by mouth in the morning.     aspirin 81 MG EC tablet Take 81 mg by mouth in the morning.     budesonide-formoterol (SYMBICORT) 80-4.5 MCG/ACT inhaler Inhale 2 puffs into the lungs every 4 (four) hours as needed. (Patient taking differently: Inhale 2 puffs into the lungs every 4 (four) hours as needed (wheezing/respiratory issues.).) 1 each 12   Calcium Carb-Cholecalciferol (CALTRATE 600+D3 PO) Take 1 tablet by mouth in the morning.     Cholecalciferol (VITAMIN D-3) 125 MCG (5000 UT) TABS Take 5,000 Units by mouth in the morning.     Cyanocobalamin (VITAMIN B-12) 2500 MCG SUBL Take 2,500 mcg by mouth in the morning.     Fluticasone-Umeclidin-Vilant (TRELEGY ELLIPTA) 100-62.5-25 MCG/ACT AEPB Inhale 1 puff into the lungs daily. 1 each 11   guaiFENesin (MUCINEX FAST-MAX CHEST CONG MS) 100 MG/5ML liquid Take 10-15 mLs by mouth  every 4 (four) hours as needed for cough or to loosen phlegm.     hydroxychloroquine (PLAQUENIL) 200 MG tablet Take 200 mg by mouth 2 (two) times daily.     levocetirizine (XYZAL) 5 MG tablet Take 1 tablet (5 mg total) by mouth every evening. 30 tablet 11   naproxen sodium (ALEVE) 220 MG tablet Take 440 mg by mouth daily as needed (pain.).     Omega-3 Fatty Acids (FISH OIL PO) Take 1 capsule by mouth in the morning.     Potassium 99 MG TABS Take 99 mg by mouth in the morning.     valACYclovir (VALTREX) 1000 MG tablet Take 1,000-2,000 mg by mouth 2 (two) times daily as needed (cold sore/fever blister).      zinc sulfate 220 (50 Zn) MG capsule Take 220 mg by mouth in the morning.     folic acid (FOLVITE) 1 MG tablet Take 1 mg by mouth in the morning. (Patient not taking: Reported on 06/01/2022)     methotrexate (RHEUMATREX) 2.5 MG tablet Take 12.5 mg by mouth every Friday. (Patient not taking: Reported on 06/01/2022)     No facility-administered medications prior to visit.

## 2022-06-06 ENCOUNTER — Ambulatory Visit
Admission: EM | Admit: 2022-06-06 | Discharge: 2022-06-06 | Disposition: A | Discharge status: Home / Self Care | Payer: Medicare HMO | Attending: Urgent Care | Admitting: Urgent Care

## 2022-06-06 DIAGNOSIS — R053 Chronic cough: Secondary | ICD-10-CM

## 2022-06-06 MED ORDER — BENZONATATE 100 MG PO CAPS: ORAL_CAPSULE | ORAL | 0 refills | Status: DC

## 2022-06-06 NOTE — ED Provider Notes (Signed)
[name]    CSN: [id number] Arrival date & time: 06/06/22  [id number]      History   Chief Complaint Chief Complaint  Patient presents with   Cough   Nasal Congestion    HPI Kim Bentley is a 82 y.o. female.    Cough   Patient presents to urgent care with complaint of chronic cough and nasal congestion.  She states her cough has been present "a few months". Patient states she was seen by pulmonology 5 days ago and prescribed a nasal spray for relief of symptoms that he attributed to "upper respiratory cough syndrome" (PND).  Her chart shows recommendation of Flonase.  Patient states she has wheezing when she breathes.  Has prescribed Symbicort inhaler which she uses as directed.  She also uses albuterol via nebulizer and reports some improvement.  States she has been using prescribed/recommended Flonase as directed.  PMH significant for ILD and multiple chest masses.  Past Medical History:  Diagnosis Date   Allergic genetic state    Arthritis    Bladder spasms    COPD (chronic obstructive pulmonary disease) ([place])    mild   COVID 2021   very mild   Dyspnea    GERD (gastroesophageal reflux disease)    History of kidney stones    HLD (hyperlipidemia)    Pneumonia    Pulmonary mass     Patient Active Problem List   Diagnosis Date Noted   Lymphadenopathy, mediastinal    Right lower lobe lung mass 12/31/2021   Shortness of breath 11/16/2021   Rheumatoid arthritis involving multiple sites with positive rheumatoid factor ([place]) 11/16/2021   Other chest pain 11/16/2021   ILD (interstitial lung disease) ([place]) 11/16/2021    Past Surgical History:  Procedure Laterality Date   BRONCHIAL BIOPSY  05/06/2022   Procedure: BRONCHIAL BIOPSIES;  Surgeon: Armando Reichert, MD;  Location: [place] ENDOSCOPY;  Service: Pulmonary;;   BRONCHIAL NEEDLE ASPIRATION BIOPSY  05/06/2022   Procedure: BRONCHIAL NEEDLE ASPIRATION BIOPSIES;  Surgeon: Armando Reichert, MD;  Location: [place]  ENDOSCOPY;  Service: Pulmonary;;   COLONOSCOPY     COLONOSCOPY N/A 09/27/2021   Procedure: COLONOSCOPY;  Surgeon: Lesly Rubenstein, MD;  Location: [facility] ENDOSCOPY;  Service: Endoscopy;  Laterality: N/A;   COLONOSCOPY WITH PROPOFOL N/A 03/26/2018   Procedure: COLONOSCOPY WITH PROPOFOL;  Surgeon: Lollie Sails, MD;  Location: [facility] ENDOSCOPY;  Service: Endoscopy;  Laterality: N/A;   ESOPHAGOGASTRODUODENOSCOPY     HERNIA REPAIR     TONSILLECTOMY     VIDEO BRONCHOSCOPY WITH RADIAL ENDOBRONCHIAL ULTRASOUND  05/06/2022   Procedure: VIDEO BRONCHOSCOPY WITH RADIAL ENDOBRONCHIAL ULTRASOUND;  Surgeon: Armando Reichert, MD;  Location: [place] ENDOSCOPY;  Service: Pulmonary;;    OB History   No obstetric history on file.      Home Medications    Prior to Admission medications   Medication Sig Start Date End Date Taking? Authorizing Provider  acetaminophen (TYLENOL) 500 MG tablet Take 500-1,000 mg by mouth every 8 (eight) hours as needed (pain.).    [provider]  albuterol (VENTOLIN HFA) 108 (90 Base) MCG/ACT inhaler Inhale 2 puffs into the lungs every 6 (six) hours as needed for wheezing or shortness of breath. 11/11/21   Blake Divine, MD  Ascorbic Acid (VITAMIN C PO) Take 2,000 mg by mouth in the morning.    [provider]  aspirin 81 MG EC tablet Take 81 mg by mouth in the morning.    [provider]  budesonide-formoterol [facility])  80-4.5 MCG/ACT inhaler Inhale 2 puffs into the lungs every 4 (four) hours as needed. Patient taking differently: Inhale 2 puffs into the lungs every 4 (four) hours as needed (wheezing/respiratory issues.). 03/08/22   Armando Reichert, MD  Calcium Carb-Cholecalciferol (CALTRATE 600+D3 PO) Take 1 tablet by mouth in the morning.    [provider]  Cholecalciferol (VITAMIN D-3) 125 MCG (5000 UT) TABS Take 5,000 Units by mouth in the morning.    [provider]  Cyanocobalamin (VITAMIN B-12) 2500 MCG SUBL Take 2,500 mcg by  mouth in the morning.    [provider]  fluticasone (FLONASE) 50 MCG/ACT nasal spray Place 1 spray into both nostrils daily. 06/01/22 06/01/23  Armando Reichert, MD  Fluticasone-Umeclidin-Vilant (TRELEGY ELLIPTA) 100-62.5-25 MCG/ACT AEPB Inhale 1 puff into the lungs daily. 02/08/22   Armando Reichert, MD  folic acid (FOLVITE) 1 MG tablet Take 1 mg by mouth in the morning. Patient not taking: Reported on 06/01/2022 09/02/21 09/02/22  [provider]  guaiFENesin (MUCINEX FAST-MAX CHEST CONG MS) 100 MG/5ML liquid Take 10-15 mLs by mouth every 4 (four) hours as needed for cough or to loosen phlegm.    [provider]  hydroxychloroquine (PLAQUENIL) 200 MG tablet Take 200 mg by mouth 2 (two) times daily. 10/15/20   [provider]  levocetirizine (XYZAL) 5 MG tablet Take 1 tablet (5 mg total) by mouth every evening. 03/29/22 03/29/23  Armando Reichert, MD  methotrexate (RHEUMATREX) 2.5 MG tablet Take 12.5 mg by mouth every Friday. Patient not taking: Reported on 06/01/2022 10/15/20   [provider]  naproxen sodium (ALEVE) 220 MG tablet Take 440 mg by mouth daily as needed (pain.).    [provider]  Omega-3 Fatty Acids (FISH OIL PO) Take 1 capsule by mouth in the morning.    [provider]  Potassium 99 MG TABS Take 99 mg by mouth in the morning.    [provider]  valACYclovir (VALTREX) 1000 MG tablet Take 1,000-2,000 mg by mouth 2 (two) times daily as needed (cold sore/fever blister). 04/20/22   [provider]  zinc sulfate 220 (50 Zn) MG capsule Take 220 mg by mouth in the morning.    [provider]    Family History Family History  Problem Relation Age of Onset   Diabetes Mother    Obesity Mother    Breast cancer Sister 54   Healthy Brother    Brain cancer Brother     Social History Social History   Tobacco Use   Smoking status: Never    Passive exposure: Past   Smokeless tobacco: Never  Vaping Use    Vaping Use: Never used  Substance Use Topics   Alcohol use: Never   Drug use: Never     Allergies   Patient has no known allergies.   Review of Systems Review of Systems  Respiratory:  Positive for cough.      Physical Exam Triage Vital Signs ED Triage Vitals  Enc Vitals Group     BP 06/06/22 0852 122/84     Pulse Rate 06/06/22 0852 77     Resp 06/06/22 0852 18     Temp 06/06/22 0852 (!) 97.4 F (36.3 C)     Temp Source 06/06/22 0852 Oral     SpO2 06/06/22 0852 98 %     Weight --      Height --      Head Circumference --      Peak Flow --  Pain Score 06/06/22 0851 0     Pain Loc --      Pain Edu? --      Excl. in [place]? --    No data found.  Updated Vital Signs BP 122/84 (BP Location: Left Arm)   Pulse 77   Temp (!) 97.4 F (36.3 C) (Oral)   Resp 18   SpO2 98%   Visual Acuity Right Eye Distance:   Left Eye Distance:   Bilateral Distance:    Right Eye Near:   Left Eye Near:    Bilateral Near:     Physical Exam Vitals reviewed.  Constitutional:      Appearance: Normal appearance.  Cardiovascular:     Rate and Rhythm: Normal rate and regular rhythm.     Pulses: Normal pulses.     Heart sounds: Normal heart sounds.  Pulmonary:     Effort: Pulmonary effort is normal.     Breath sounds: Wheezing present.     Comments: Expiratory wheezes are present. Skin:    General: Skin is warm and dry.  Neurological:     General: No focal deficit present.     Mental Status: She is alert and oriented to person, place, and time.  Psychiatric:        Mood and Affect: Mood normal.        Behavior: Behavior normal.      UC Treatments / Results  Labs (all labs ordered are listed, but only abnormal results are displayed) Labs Reviewed - No data to display  EKG   Radiology No results found.  Procedures Procedures (including critical care time)  Medications Ordered in UC Medications - No data to display  Initial Impression / Assessment and Plan /  UC Course  I have reviewed the triage vital signs and the nursing notes.  Pertinent labs & imaging results that were available during my care of the patient were reviewed by me and considered in my medical decision making (see chart for details).   Patient is afebrile here without recent antipyretics. Satting well on room air. Overall is well appearing, well hydrated, without respiratory distress. Pulmonary exam is remarkable for significant expiratory wheezes along with dry cough.   Discussed the patient's symptoms with her who informed me that none of them have changed since seeing pulmonology 5 days ago.  I informed the patient that I was not willing to provide a treatment that her pulmonologist did not feel was necessary.  I will prescribe some benzonatate to help calm her cough, and recommended that she contact her pulmonologist earlier than the recommended 63-monthreturn if her symptoms persisted and she remained concerned.   Final Clinical Impressions(s) / UC Diagnoses   Final diagnoses:  None   Discharge Instructions   None    ED Prescriptions   None    PDMP not reviewed this encounter.   [place]ose Phi F[place]02/12/24 [phone]

## 2022-06-06 NOTE — ED Triage Notes (Signed)
Patient presents to [facility] for chronic cough and nasal congestion. Seen by pulmonology 02/07 and prescribed nasal spray for symptom relief.

## 2022-06-06 NOTE — Discharge Instructions (Signed)
Follow up by requesting an early return visit with your pulmonologist, or with your PCP.

## 2022-06-13 ENCOUNTER — Ambulatory Visit: Payer: Medicare HMO | Admitting: Internal Medicine

## 2022-06-19 LAB — ACID FAST CULTURE WITH REFLEXED SENSITIVITIES (MYCOBACTERIA)
Acid Fast Culture: NEGATIVE
Acid Fast Culture: NEGATIVE

## 2022-06-22 DIAGNOSIS — R399 Unspecified symptoms and signs involving the genitourinary system: Secondary | ICD-10-CM | POA: Diagnosis not present

## 2022-06-23 ENCOUNTER — Ambulatory Visit: Payer: Medicare HMO | Admitting: Student in an Organized Health Care Education/Training Program

## 2022-07-01 ENCOUNTER — Encounter: Payer: Self-pay | Admitting: Student in an Organized Health Care Education/Training Program

## 2022-07-01 ENCOUNTER — Ambulatory Visit: Payer: Medicare HMO | Admitting: Student in an Organized Health Care Education/Training Program

## 2022-07-01 VITALS — BP 128/70 | HR 81 | Temp 97.8°F | Ht 61.0 in | Wt 174.4 lb

## 2022-07-01 DIAGNOSIS — R918 Other nonspecific abnormal finding of lung field: Secondary | ICD-10-CM | POA: Diagnosis not present

## 2022-07-01 DIAGNOSIS — R0602 Shortness of breath: Secondary | ICD-10-CM

## 2022-07-01 DIAGNOSIS — R053 Chronic cough: Secondary | ICD-10-CM | POA: Diagnosis not present

## 2022-07-01 NOTE — Progress Notes (Signed)
Assessment & Plan:   1. Pulmonary mass  Presents with both RUL and RLL masses that are mildly FDG avid and with associated lymphadenopathy. She underwent robotic assisted navigational bronchoscopy to the RLL and RUL mass and cytology is benign. This is likely a benign mass/nodule in the setting of immune suppression. She has stopped her methotrexate and we will continue to monitor the masses for growth or change. Will likely order a repeat CT in one year.  2. Chronic cough  Continues to have a chronic cough but this is improved. Very likely she has an upper respiratory cough syndrome further contributing to her symptoms that improved with second generation anti-histamines. She will continue with the combination of intra-nasal steroids and second generation anti-histamines.  3. Shortness of breath  Patient has a history of obstruction on PFT's from 12/13/2021 with scooping of the expiratory flow volume loop. While her ratio is preserved (73%), she does have an impaired FEV1 of 1.19 at 68% predicted. 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 She also has a strong family history of asthma which is likely a very strong contributor to her symptoms. FVC is decreased and so is TLC, suggesting mild restriction. DLCO was within normal. CT high resolution on 11/23/2021 did not show any signs of ILD. 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 She is on Trelegy and as needed Symbicort. She will let us know if her insurance doesn't cover the Symbicort so we can provide her with a different as needed inhaler.  Return in about 6 months (around 01/01/2023).  I spent 20 minutes caring for this patient today, including preparing to see the patient, obtaining a medical history , reviewing a separately obtained history, performing a medically appropriate examination and/or evaluation, counseling and educating the patient/family/caregiver, ordering  medications, tests, or procedures, and documenting clinical information in the electronic health record  Armando Reichert, MD [facility] 07/01/2022 12:20 PM    End of visit medications:  No orders of the defined types were placed in this encounter.    Current Outpatient Medications:    acetaminophen (TYLENOL) 500 MG tablet, Take 500-1,000 mg by mouth every 8 (eight) hours as needed (pain.)., Disp: , Rfl:    albuterol (VENTOLIN HFA) 108 (90 Base) MCG/ACT inhaler, Inhale 2 puffs into the lungs every 6 (six) hours as needed for wheezing or shortness of breath., Disp: 8 g, Rfl: 0   Ascorbic Acid (VITAMIN C PO), Take 2,000 mg by mouth in the morning., Disp: , Rfl:    aspirin 81 MG EC tablet, Take 81 mg by mouth in the morning., Disp: , Rfl:    benzonatate (TESSALON) 100 MG capsule, Take 1-2 tablets 3 times a day as needed for cough, Disp: 30 capsule, Rfl: 0   budesonide-formoterol (SYMBICORT) 80-4.5 MCG/ACT inhaler, Inhale 2 puffs into the lungs every 4 (four) hours as needed. (Patient taking differently: Inhale 2 puffs into the lungs every 4 (four) hours as needed (wheezing/respiratory issues.).), Disp: 1 each, Rfl: 12   Calcium Carb-Cholecalciferol (CALTRATE 600+D3 PO), Take 1 tablet by mouth in the morning., Disp: , Rfl:    Cholecalciferol (VITAMIN D-3) 125 MCG (5000 UT) TABS, Take 5,000 Units by mouth in the morning., Disp: , Rfl:    Cyanocobalamin (VITAMIN B-12) 2500 MCG SUBL, Take 2,500 mcg by mouth in the morning., Disp: , Rfl:    fluticasone (FLONASE) 50 MCG/ACT nasal spray, Place 1 spray into both nostrils daily., Disp:  18.2 mL, Rfl: 6   Fluticasone-Umeclidin-Vilant (TRELEGY ELLIPTA) 100-62.5-25 MCG/ACT AEPB, Inhale 1 puff into the lungs daily., Disp: 1 each, Rfl: 11   guaiFENesin (MUCINEX FAST-MAX CHEST CONG MS) 100 MG/5ML liquid, Take 10-15 mLs by mouth every 4 (four) hours as needed for cough or to loosen phlegm., Disp: , Rfl:    hydroxychloroquine (PLAQUENIL) 200 MG  tablet, Take 200 mg by mouth 2 (two) times daily., Disp: , Rfl:    levocetirizine (XYZAL) 5 MG tablet, Take 1 tablet (5 mg total) by mouth every evening., Disp: 30 tablet, Rfl: 11   naproxen sodium (ALEVE) 220 MG tablet, Take 440 mg by mouth daily as needed (pain.)., Disp: , Rfl:    Omega-3 Fatty Acids (FISH OIL PO), Take 1 capsule by mouth in the morning., Disp: , Rfl:    Potassium 99 MG TABS, Take 99 mg by mouth in the morning., Disp: , Rfl:    valACYclovir (VALTREX) 1000 MG tablet, Take 1,000-2,000 mg by mouth 2 (two) times daily as needed (cold sore/fever blister)., Disp: , Rfl:    zinc sulfate 220 (50 Zn) MG capsule, Take 220 mg by mouth in the morning., Disp: , Rfl:    folic acid (FOLVITE) 1 MG tablet, Take 1 mg by mouth in the morning. (Patient not taking: Reported on 07/01/2022), Disp: , Rfl:    methotrexate (RHEUMATREX) 2.5 MG tablet, Take 12.5 mg by mouth every Friday. (Patient not taking: Reported on 07/01/2022), Disp: , Rfl:    Subjective:   PATIENT ID: Kim Bentley GENDER: female DOB: 07/13/1940, MRN: [id number]  Chief Complaint  Patient presents with   Follow-up    Mild SOB with exertion     HPI  Kim Bentley is a pleasant 82 year old female patient who is presenting to clinic for follow up. I had seen her for a pulmonary mass s/p biopsy as well as symptoms of cough and shortness of breath.  Patient is here for an acute visit that was scheduled secondary to cough, but her symptoms have subsided and she feels much better compared to prior. She reports a raspy voice, but otherwise the cough and wheezing is much improved. She has some shortness of breath on exertion that is unchanged. 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 Her sister passed away recently.  Patient underwent repeat robotic assisted navigational bronchoscopy with biopsy of masses in her RUL and RLL in January of 2024. The pathology result has returned benign,  and culture has grown H. Influenza, now s/p a course of antibiotics. She was incidentally found to have a pulmonary mass after presenting with shortness of breath following a fall. She then underwent a PET/CT showing low FDG avidity in both masses. The CT scan was also notable for lymphadenopathy (4R and 7 on my review). She was referred to oncology and saw Dr. Rogue Bussing and was subsequently presented in tumor board and was subsequently referred for biopsy.   She underwent robotic assisted navigational bronchoscopy on 01/21/2022 to the RLL mass, in addition EBUS to multiple stations. The results from the biopsy has returned benign, with cultures growing Ralstonia Picketti. Similarly, result from the EBUS has been negative for malignancy. Repeat Navigational Bronchoscopy on 05/06/2022 showed benign cytology and cultures with H. Influenza. EBUS was not repeated during said procedure.   In regards to her RA, she has followed with rheumatology. Patient has stopped her methotrexate but is continued on Hydroxychloroquine. She is a non-smoker.  Ancillary information including prior medications, full medical/surgical/family/social histories, and PFTs (when available) are listed below and have been reviewed.   Review of Systems  Constitutional:  Negative for chills and fever.  Respiratory:  Positive for cough and shortness of breath. Negative for wheezing.   Cardiovascular:  Negative for chest pain.  Skin:  Negative for rash.     Objective:   Vitals:   07/01/22 0859  BP: 128/70  Pulse: 81  Temp: 97.8 F (36.6 C)  TempSrc: Temporal  SpO2: 97%  Weight: 174 lb 6.4 oz (79.1 kg)  Height: '5\' 1"'$  (1.549 m)   97% on RA  BMI Readings from Last 3 Encounters:  07/01/22 32.95 kg/m  06/01/22 33.10 kg/m  05/06/22 31.93 kg/m   Wt Readings from Last 3 Encounters:  07/01/22 174 lb 6.4 oz (79.1 kg)  06/01/22 175 lb 3.2 oz (79.5 kg)  05/06/22 169 lb (76.7 kg)    Physical Exam Vitals reviewed.   Constitutional:      Appearance: Normal appearance. She is obese.  HENT:     Nose: Nose normal.     Mouth/Throat:     Mouth: Mucous membranes are moist.  Eyes:     Pupils: Pupils are equal, round, and reactive to light.  Cardiovascular:     Rate and Rhythm: Normal rate and regular rhythm.     Pulses: Normal pulses.     Heart sounds: Normal heart sounds.  Pulmonary:     Effort: Pulmonary effort is normal.     Breath sounds: Normal breath sounds. No wheezing or rhonchi.  Abdominal:     General: There is distension.     Palpations: Abdomen is soft.  Musculoskeletal:     Cervical back: Normal range of motion.  Skin:    General: Skin is warm.  Neurological:     General: No focal deficit present.     Mental Status: She is alert and oriented to person, place, and time. Mental status is at baseline.       Ancillary Information    Past Medical History:  Diagnosis Date   Allergic genetic state    Arthritis    Bladder spasms    COPD (chronic obstructive pulmonary disease) ([place])    mild   COVID 2021   very mild   Dyspnea    GERD (gastroesophageal reflux disease)    History of kidney stones    HLD (hyperlipidemia)    Pneumonia    Pulmonary mass      Family History  Problem Relation Age of Onset   Diabetes Mother    Obesity Mother    Breast cancer Sister 59   Healthy Brother    Brain cancer Brother      Past Surgical History:  Procedure Laterality Date   BRONCHIAL BIOPSY  05/06/2022   Procedure: BRONCHIAL BIOPSIES;  Surgeon: Armando Reichert, MD;  Location: [facility];  Service: Pulmonary;;   BRONCHIAL NEEDLE ASPIRATION BIOPSY  05/06/2022   Procedure: BRONCHIAL NEEDLE ASPIRATION BIOPSIES;  Surgeon: Armando Reichert, MD;  Location: [facility];  Service: Pulmonary;;   COLONOSCOPY     COLONOSCOPY N/A 09/27/2021   Procedure: COLONOSCOPY;  Surgeon: Lesly Rubenstein, MD;  Location: [facility] ENDOSCOPY;  Service: Endoscopy;  Laterality: N/A;   COLONOSCOPY WITH PROPOFOL N/A  03/26/2018   Procedure: COLONOSCOPY WITH PROPOFOL;  Surgeon: Lollie Sails, MD;  Location: [facility] ENDOSCOPY;  Service: Endoscopy;  Laterality: N/A;   ESOPHAGOGASTRODUODENOSCOPY     HERNIA REPAIR     TONSILLECTOMY  VIDEO BRONCHOSCOPY WITH RADIAL ENDOBRONCHIAL ULTRASOUND  05/06/2022   Procedure: VIDEO BRONCHOSCOPY WITH RADIAL ENDOBRONCHIAL ULTRASOUND;  Surgeon: Armando Reichert, MD;  Location: [facility] END[facility]COPY;  Service: Pulmonary;;    Social History   Socioeconomic History   Marital status: Widowed    Spouse name: Not on file   Number of children: Not on file   Years of education: Not on file   Highest education level: Not on file  Occupational History   Not on file  Tobacco Use   Smoking status: Never    Passive exposure: Past   Smokeless tobacco: Never  Vaping Use   Vaping Use: Never used  Substance and Sexual Activity   Alcohol use: Never   Drug use: Never   Sexual activity: Not on file  Other Topics Concern   Not on file  Social History Narrative   Lives alone, grandchild lives with her   Social Determinants of Health   Financial Resource Strain: Not on file  Food Insecurity: Not on file  Transportation Needs: No Transportation Needs (12/31/2021)   PRAPARE - Hydrologist (Medical): No    Lack of Transportation (Non-Medical): No  Physical Activity: Not on file  Stress: Not on file  Social Connections: Not on file  Intimate Partner Violence: Not on file     No Known Allergies   CBC    Component Value Date/Time   WBC 9.8 05/06/2022 0601   RBC 4.12 05/06/2022 0601   HGB 12.8 05/06/2022 0601   HGB 12.4 10/27/2011 0217   HCT 39.0 05/06/2022 0601   HCT 36.8 10/27/2011 0217   PLT 222 05/06/2022 0601   PLT 182 10/27/2011 0217   MCV 94.7 05/06/2022 0601   MCV 91 10/27/2011 0217   MCH 31.1 05/06/2022 0601   MCHC 32.8 05/06/2022 0601   RDW 13.9 05/06/2022 0601   RDW 12.9 10/27/2011 0217   LYMPHSABS 1.9 01/12/2022 1235   LYMPHSABS 2.3  10/27/2011 0217   MONOABS 0.5 01/12/2022 1235   MONOABS 0.7 10/27/2011 0217   EOSABS 0.2 01/12/2022 1235   EOSABS 0.2 10/27/2011 0217   BASOSABS 0.1 01/12/2022 1235   BASOSABS 0.0 10/27/2011 0217    Pulmonary Functions Testing Results:     No data to display          Outpatient Medications Prior to Visit  Medication Sig Dispense Refill   acetaminophen (TYLENOL) 500 MG tablet Take 500-1,000 mg by mouth every 8 (eight) hours as needed (pain.).     albuterol (VENTOLIN HFA) 108 (90 Base) MCG/ACT inhaler Inhale 2 puffs into the lungs every 6 (six) hours as needed for wheezing or shortness of breath. 8 g 0   Ascorbic Acid (VITAMIN C PO) Take 2,000 mg by mouth in the morning.     aspirin 81 MG EC tablet Take 81 mg by mouth in the morning.     benzonatate (TESSALON) 100 MG capsule Take 1-2 tablets 3 times a day as needed for cough 30 capsule 0   budesonide-formoterol (SYMBICORT) 80-4.5 MCG/ACT inhaler Inhale 2 puffs into the lungs every 4 (four) hours as needed. (Patient taking differently: Inhale 2 puffs into the lungs every 4 (four) hours as needed (wheezing/respiratory issues.).) 1 each 12   Calcium Carb-Cholecalciferol (CALTRATE 600+D3 PO) Take 1 tablet by mouth in the morning.     Cholecalciferol (VITAMIN D-3) 125 MCG (5000 UT) TABS Take 5,000 Units by mouth in the morning.     Cyanocobalamin (VITAMIN B-12) 2500 MCG SUBL  Take 2,500 mcg by mouth in the morning.     fluticasone (FLONASE) 50 MCG/ACT nasal spray Place 1 spray into both nostrils daily. 18.2 mL 6   Fluticasone-Umeclidin-Vilant (TRELEGY ELLIPTA) 100-62.5-25 MCG/ACT AEPB Inhale 1 puff into the lungs daily. 1 each 11   guaiFENesin (MUCINEX FAST-MAX CHEST CONG MS) 100 MG/5ML liquid Take 10-15 mLs by mouth every 4 (four) hours as needed for cough or to loosen phlegm.     hydroxychloroquine (PLAQUENIL) 200 MG tablet Take 200 mg by mouth 2 (two) times daily.     levocetirizine (XYZAL) 5 MG tablet Take 1 tablet (5 mg total) by mouth  every evening. 30 tablet 11   naproxen sodium (ALEVE) 220 MG tablet Take 440 mg by mouth daily as needed (pain.).     Omega-3 Fatty Acids (FISH OIL PO) Take 1 capsule by mouth in the morning.     Potassium 99 MG TABS Take 99 mg by mouth in the morning.     valACYclovir (VALTREX) 1000 MG tablet Take 1,000-2,000 mg by mouth 2 (two) times daily as needed (cold sore/fever blister).     zinc sulfate 220 (50 Zn) MG capsule Take 220 mg by mouth in the morning.     folic acid (FOLVITE) 1 MG tablet Take 1 mg by mouth in the morning. (Patient not taking: Reported on 07/01/2022)     methotrexate (RHEUMATREX) 2.5 MG tablet Take 12.5 mg by mouth every Friday. (Patient not taking: Reported on 07/01/2022)     No facility-administered medications prior to visit.

## 2022-10-03 ENCOUNTER — Other Ambulatory Visit: Payer: Self-pay

## 2022-10-03 DIAGNOSIS — Z1231 Encounter for screening mammogram for malignant neoplasm of breast: Secondary | ICD-10-CM

## 2022-10-20 ENCOUNTER — Ambulatory Visit
Admission: RE | Admit: 2022-10-20 | Discharge: 2022-10-20 | Disposition: A | Discharge status: Home / Self Care | Payer: Medicare HMO | Source: Ambulatory Visit | Attending: Family Medicine | Admitting: Family Medicine

## 2022-10-20 DIAGNOSIS — Z1231 Encounter for screening mammogram for malignant neoplasm of breast: Secondary | ICD-10-CM | POA: Diagnosis present

## 2023-01-05 ENCOUNTER — Encounter: Payer: Self-pay | Admitting: Student in an Organized Health Care Education/Training Program

## 2023-01-05 ENCOUNTER — Ambulatory Visit: Payer: Medicare HMO | Admitting: Student in an Organized Health Care Education/Training Program

## 2023-01-05 VITALS — BP 124/74 | HR 104 | Temp 98.1°F | Ht 61.0 in | Wt 175.8 lb

## 2023-01-05 DIAGNOSIS — R918 Other nonspecific abnormal finding of lung field: Secondary | ICD-10-CM | POA: Diagnosis not present

## 2023-01-05 DIAGNOSIS — Z23 Encounter for immunization: Secondary | ICD-10-CM

## 2023-01-05 NOTE — Progress Notes (Signed)
Assessment & Plan:   1. Pulmonary mass   Presented with both RUL and RLL masses that are mildly FDG avid and with associated lymphadenopathy. She underwent robotic assisted navigational bronchoscopy to the RLL and RUL mass and cytology is benign. This is likely a benign mass/nodule in the setting of immune suppression. She has stopped her methotrexate and we will continue to monitor the masses for growth or change. Will likely order a repeat CT at the one year mark. We will discuss this further at follow up.   2. Chronic cough  Chronic cough is improved. This is likely due to improvement in UACS and better control of asthma. She will continue on second generation anti-histamines and intra-nasal corticosteroids.   3. Shortness of breath   Patient has a history of obstruction on PFT's from 12/13/2021 with scooping of the expiratory flow volume loop. While her ratio is preserved (73%), she does have an impaired FEV1 of 1.19 at 68% predicted. 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 She also has a strong family history of asthma which is likely a very strong contributor to her symptoms. FVC is decreased and so is TLC, suggesting mild restriction. DLCO was within normal. CT high resolution on 11/23/2021 did not show any signs of ILD. 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 She is on Trelegy and as needed Symbicort. But is currently only using Trelegy as needed. Overall symptoms are better controlled and we will continue the treatment plan as is.  -continue Trelegy -continue symbicort  Return in about 4 months (around 05/07/2023).  I spent 30 minutes caring for this patient today, including preparing to see the patient, obtaining a medical history , reviewing a separately obtained history, performing a medically appropriate examination and/or evaluation, counseling and educating the patient/family/caregiver, and documenting clinical  information in the electronic health record  Kim Chute, MD [facility] 01/05/2023 3:22 PM    End of visit medications:  No orders of the defined types were placed in this encounter.    Current Outpatient Medications:    acetaminophen (TYLENOL) 500 MG tablet, Take 500-1,000 mg by mouth every 8 (eight) hours as needed (pain.)., Disp: , Rfl:    albuterol (VENTOLIN HFA) 108 (90 Base) MCG/ACT inhaler, Inhale 2 puffs into the lungs every 6 (six) hours as needed for wheezing or shortness of breath., Disp: 8 g, Rfl: 0   Ascorbic Acid (VITAMIN C PO), Take 2,000 mg by mouth in the morning., Disp: , Rfl:    aspirin 81 MG EC tablet, Take 81 mg by mouth in the morning., Disp: , Rfl:    Calcium Carb-Cholecalciferol (CALTRATE 600+D3 PO), Take 1 tablet by mouth in the morning., Disp: , Rfl:    Cholecalciferol (VITAMIN D-3) 125 MCG (5000 UT) TABS, Take 5,000 Units by mouth in the morning., Disp: , Rfl:    Cyanocobalamin (VITAMIN B-12) 2500 MCG SUBL, Take 2,500 mcg by mouth in the morning., Disp: , Rfl:    fluticasone (FLONASE) 50 MCG/ACT nasal spray, Place 1 spray into both nostrils daily., Disp: 18.2 mL, Rfl: 6   Fluticasone-Umeclidin-Vilant (TRELEGY ELLIPTA) 100-62.5-25 MCG/ACT AEPB, Inhale 1 puff into the lungs daily., Disp: 1 each, Rfl: 11   hydroxychloroquine (PLAQUENIL) 200 MG tablet, Take 200 mg by mouth 2 (two) times daily., Disp: , Rfl:    levocetirizine (XYZAL) 5 MG tablet, Take 1 tablet (5 mg total) by mouth every evening., Disp: 30 tablet, Rfl: 11  naproxen sodium (ALEVE) 220 MG tablet, Take 440 mg by mouth daily as needed (pain.)., Disp: , Rfl:    Omega-3 Fatty Acids (FISH OIL PO), Take 1 capsule by mouth in the morning., Disp: , Rfl:    Potassium 99 MG TABS, Take 99 mg by mouth in the morning., Disp: , Rfl:    valACYclovir (VALTREX) 1000 MG tablet, Take 1,000-2,000 mg by mouth 2 (two) times daily as needed (cold sore/fever blister)., Disp: , Rfl:    zinc sulfate 220  (50 Zn) MG capsule, Take 220 mg by mouth in the morning., Disp: , Rfl:    budesonide-formoterol (SYMBICORT) 80-4.5 MCG/ACT inhaler, Inhale 2 puffs into the lungs every 4 (four) hours as needed. (Patient taking differently: Inhale 2 puffs into the lungs every 4 (four) hours as needed (wheezing/respiratory issues.).), Disp: 1 each, Rfl: 12   methotrexate (RHEUMATREX) 2.5 MG tablet, Take 12.5 mg by mouth every Friday. (Patient not taking: Reported on 07/01/2022), Disp: , Rfl:    Subjective:   PATIENT ID: Kim Bentley GENDER: female DOB: 02-Oct-1940, MRN: [id number]  Chief Complaint  Patient presents with   Follow-up    Patient reports occ SOB.     HPI  Kim Bentley is a pleasant 82 year old female patient who is presenting to clinic for follow up. I had seen her for a pulmonary mass s/p biopsy as well as symptoms of cough and shortness of breath.  She is presenting today for follow up. She is feeling well overall. Cough is mostly resolved, and shortness of breath is improved. She is using Trelegy PRN. She does not have any wheezing.   Patient underwent repeat robotic assisted navigational bronchoscopy with biopsy of masses in her RUL and RLL in January of 2024. The pathology result has returned benign, and culture has grown H. Influenza, now s/p a course of antibiotics. She was incidentally found to have a pulmonary mass after presenting with shortness of breath following a fall. She then underwent a PET/CT showing low FDG avidity in both masses. The CT scan was also notable for lymphadenopathy (4R and 7 on my review). She was referred to oncology and saw Dr. Donneta Bentley and was subsequently presented in tumor board and was subsequently referred for biopsy.   She underwent robotic assisted navigational bronchoscopy on 01/21/2022 to the RLL mass, in addition EBUS to multiple stations. The results from the biopsy has returned benign, with cultures growing Kim Bentley. Similarly, result from the EBUS  has been negative for malignancy. Repeat Navigational Bronchoscopy on 05/06/2022 showed benign cytology and cultures with H. Influenza. EBUS was not repeated during said procedure.   In regards to her RA, she has followed with rheumatology. Patient is off methotrexate and continues on hydrxychloroquine. She is a non-smoker.    Ancillary information including prior medications, full medical/surgical/family/social histories, and PFTs (when available) are listed below and have been reviewed.   Review of Systems  Constitutional:  Negative for chills and fever.  Respiratory:  Negative for cough, shortness of breath and wheezing.   Cardiovascular:  Negative for chest pain.  Skin:  Negative for rash.     Objective:   Vitals:   01/05/23 1519  BP: 124/74  Pulse: (!) 104  Temp: 98.1 F (36.7 C)  TempSrc: Temporal  SpO2: 96%  Weight: 175 lb 12.8 oz (79.7 kg)  Height: 5\' 1"  (1.549 m)   96% on RA  BMI Readings from Last 3 Encounters:  01/05/23 33.22 kg/m  07/01/22 32.95 kg/m  06/01/22 33.10 kg/m   Wt Readings from Last 3 Encounters:  01/05/23 175 lb 12.8 oz (79.7 kg)  07/01/22 174 lb 6.4 oz (79.1 kg)  06/01/22 175 lb 3.2 oz (79.5 kg)    Physical Exam Vitals reviewed.  Constitutional:      Appearance: Normal appearance. She is obese.  HENT:     Nose: Nose normal.     Mouth/Throat:     Mouth: Mucous membranes are moist.  Eyes:     Pupils: Pupils are equal, round, and reactive to light.  Cardiovascular:     Rate and Rhythm: Normal rate and regular rhythm.     Pulses: Normal pulses.     Heart sounds: Normal heart sounds.  Pulmonary:     Effort: Pulmonary effort is normal.     Breath sounds: Normal breath sounds. No wheezing or rhonchi.  Abdominal:     General: There is distension.     Palpations: Abdomen is soft.  Musculoskeletal:     Cervical back: Normal range of motion.  Skin:    General: Skin is warm.  Neurological:     General: No focal deficit present.      Mental Status: She is alert and oriented to person, place, and time. Mental status is at baseline.       Ancillary Information    Past Medical History:  Diagnosis Date   Allergic genetic state    Arthritis    Bladder spasms    COPD (chronic obstructive pulmonary disease) (HCC)    mild   COVID 2021   very mild   Dyspnea    GERD (gastroesophageal reflux disease)    History of kidney stones    HLD (hyperlipidemia)    Pneumonia    Pulmonary mass      Family History  Problem Relation Age of Onset   Diabetes Mother    Obesity Mother    Breast cancer Sister 52   Healthy Brother    Brain cancer Brother      Past Surgical History:  Procedure Laterality Date   BRONCHIAL BIOPSY  05/06/2022   Procedure: BRONCHIAL BIOPSIES;  Surgeon: Kim Chute, MD;  Location: [facility];  Service: Pulmonary;;   BRONCHIAL NEEDLE ASPIRATION BIOPSY  05/06/2022   Procedure: BRONCHIAL NEEDLE ASPIRATION BIOPSIES;  Surgeon: Kim Chute, MD;  Location: [facility];  Service: Pulmonary;;   COLONOSCOPY     COLONOSCOPY N/A 09/27/2021   Procedure: COLONOSCOPY;  Surgeon: Regis Bill, MD;  Location: [facility] ENDOSCOPY;  Service: Endoscopy;  Laterality: N/A;   COLONOSCOPY WITH PROPOFOL N/A 03/26/2018   Procedure: COLONOSCOPY WITH PROPOFOL;  Surgeon: Christena Deem, MD;  Location: [facility] ENDOSCOPY;  Service: Endoscopy;  Laterality: N/A;   ESOPHAGOGASTRODUODENOSCOPY     HERNIA REPAIR     TONSILLECTOMY     VIDEO BRONCHOSCOPY WITH RADIAL ENDOBRONCHIAL ULTRASOUND  05/06/2022   Procedure: VIDEO BRONCHOSCOPY WITH RADIAL ENDOBRONCHIAL ULTRASOUND;  Surgeon: Kim Chute, MD;  Location: [facility] ENDOSCOPY;  Service: Pulmonary;;    Social History   Socioeconomic History   Marital status: Widowed    Spouse name: Not on file   Number of children: Not on file   Years of education: Not on file   Highest education level: Not on file  Occupational History   Not on file  Tobacco Use   Smoking status: Never     Passive exposure: Past   Smokeless tobacco: Never  Vaping Use   Vaping status: Never Used  Substance and Sexual Activity   Alcohol  use: Never   Drug use: Never   Sexual activity: Not on file  Other Topics Concern   Not on file  Social History Narrative   Lives alone, grandchild lives with her   Social Determinants of Health   Financial Resource Strain: Patient Declined (03/28/2022)   Received from [facility], [facility]   Overall Financial Resource Strain (CARDIA)    Difficulty of Paying Living Expenses: Patient declined  Food Insecurity: Patient Declined (03/28/2022)   Received from [facility], [facility]   Hunger Vital Sign    Worried About Running Out of Food in the Last Year: Patient declined    Ran Out of Food in the Last Year: Patient declined  Transportation Needs: Patient Declined (03/28/2022)   Received from [facility], [facility]   PRAPARE - Transportation    In the past 12 months, has lack of transportation kept you from medical appointments or from getting medications?: Patient declined    Lack of Transportation (Non-Medical): Patient declined  Physical Activity: Not on file  Stress: Not on file  Social Connections: Not on file  Intimate Partner Violence: Not on file     No Known Allergies   CBC    Component Value Date/Time   WBC 9.8 05/06/2022 0601   RBC 4.12 05/06/2022 0601   HGB 12.8 05/06/2022 0601   HGB 12.4 10/27/2011 0217   HCT 39.0 05/06/2022 0601   HCT 36.8 10/27/2011 0217   PLT 222 05/06/2022 0601   PLT 182 10/27/2011 0217   MCV 94.7 05/06/2022 0601   MCV 91 10/27/2011 0217   MCH 31.1 05/06/2022 0601   MCHC 32.8 05/06/2022 0601   RDW 13.9 05/06/2022 0601   RDW 12.9 10/27/2011 0217   LYMPHSABS 1.9 01/12/2022 1235   LYMPHSABS 2.3 10/27/2011 0217   MONOABS 0.5 01/12/2022 1235   MONOABS 0.7 10/27/2011 0217   EOSABS 0.2  01/12/2022 1235   EOSABS 0.2 10/27/2011 0217   BASOSABS 0.1 01/12/2022 1235   BASOSABS 0.0 10/27/2011 0217    Pulmonary Functions Testing Results:     No data to display          Outpatient Medications Prior to Visit  Medication Sig Dispense Refill   acetaminophen (TYLENOL) 500 MG tablet Take 500-1,000 mg by mouth every 8 (eight) hours as needed (pain.).     albuterol (VENTOLIN HFA) 108 (90 Base) MCG/ACT inhaler Inhale 2 puffs into the lungs every 6 (six) hours as needed for wheezing or shortness of breath. 8 g 0   Ascorbic Acid (VITAMIN C PO) Take 2,000 mg by mouth in the morning.     aspirin 81 MG EC tablet Take 81 mg by mouth in the morning.     Calcium Carb-Cholecalciferol (CALTRATE 600+D3 PO) Take 1 tablet by mouth in the morning.     Cholecalciferol (VITAMIN D-3) 125 MCG (5000 UT) TABS Take 5,000 Units by mouth in the morning.     Cyanocobalamin (VITAMIN B-12) 2500 MCG SUBL Take 2,500 mcg by mouth in the morning.     fluticasone (FLONASE) 50 MCG/ACT nasal spray Place 1 spray into both nostrils daily. 18.2 mL 6   Fluticasone-Umeclidin-Vilant (TRELEGY ELLIPTA) 100-62.5-25 MCG/ACT AEPB Inhale 1 puff into the lungs daily. 1 each 11   hydroxychloroquine (PLAQUENIL) 200 MG tablet Take 200 mg by mouth 2 (two) times daily.     levocetirizine (XYZAL) 5 MG tablet Take 1 tablet (5 mg total) by  mouth every evening. 30 tablet 11   naproxen sodium (ALEVE) 220 MG tablet Take 440 mg by mouth daily as needed (pain.).     Omega-3 Fatty Acids (FISH OIL PO) Take 1 capsule by mouth in the morning.     Potassium 99 MG TABS Take 99 mg by mouth in the morning.     valACYclovir (VALTREX) 1000 MG tablet Take 1,000-2,000 mg by mouth 2 (two) times daily as needed (cold sore/fever blister).     zinc sulfate 220 (50 Zn) MG capsule Take 220 mg by mouth in the morning.     budesonide-formoterol (SYMBICORT) 80-4.5 MCG/ACT inhaler Inhale 2 puffs into the lungs every 4 (four) hours as needed. (Patient taking  differently: Inhale 2 puffs into the lungs every 4 (four) hours as needed (wheezing/respiratory issues.).) 1 each 12   methotrexate (RHEUMATREX) 2.5 MG tablet Take 12.5 mg by mouth every Friday. (Patient not taking: Reported on 07/01/2022)     benzonatate (TESSALON) 100 MG capsule Take 1-2 tablets 3 times a day as needed for cough (Patient not taking: Reported on 01/05/2023) 30 capsule 0   guaiFENesin (MUCINEX FAST-MAX CHEST CONG MS) 100 MG/5ML liquid Take 10-15 mLs by mouth every 4 (four) hours as needed for cough or to loosen phlegm. (Patient not taking: Reported on 01/05/2023)     No facility-administered medications prior to visit.

## 2023-04-06 ENCOUNTER — Ambulatory Visit
Admission: EM | Admit: 2023-04-06 | Discharge: 2023-04-06 | Disposition: A | Discharge status: Home / Self Care | Payer: Medicare HMO | Attending: Emergency Medicine | Admitting: Emergency Medicine

## 2023-04-06 ENCOUNTER — Ambulatory Visit: Payer: Medicare HMO

## 2023-04-06 DIAGNOSIS — J441 Chronic obstructive pulmonary disease with (acute) exacerbation: Secondary | ICD-10-CM

## 2023-04-06 DIAGNOSIS — R0602 Shortness of breath: Secondary | ICD-10-CM | POA: Diagnosis not present

## 2023-04-06 DIAGNOSIS — J22 Unspecified acute lower respiratory infection: Secondary | ICD-10-CM | POA: Diagnosis not present

## 2023-04-06 LAB — POC COVID19/FLU A&B COMBO
Covid Antigen, POC: NEGATIVE
Influenza A Antigen, POC: NEGATIVE
Influenza B Antigen, POC: NEGATIVE

## 2023-04-06 MED ORDER — AMOXICILLIN-POT CLAVULANATE 875-125 MG PO TABS: 1.0000 | ORAL_TABLET | Freq: Two times a day (BID) | ORAL | 0 refills | Status: AC

## 2023-04-06 MED ORDER — AZITHROMYCIN 250 MG PO TABS: ORAL_TABLET | ORAL | 0 refills | Status: AC

## 2023-04-06 MED ORDER — IPRATROPIUM-ALBUTEROL 0.5-2.5 (3) MG/3ML IN SOLN
3.0000 mL | Freq: Once | RESPIRATORY_TRACT | Status: AC
  Administered 2023-04-06: 3 mL via RESPIRATORY_TRACT

## 2023-04-06 MED ORDER — IPRATROPIUM BROMIDE HFA 17 MCG/ACT IN AERS: 2.0000 | INHALATION_SPRAY | RESPIRATORY_TRACT | 0 refills | Status: DC | PRN

## 2023-04-06 NOTE — Progress Notes (Signed)
Please advise patient that chest x-ray is concerning for pneumonia in her right lower lobe as I suspected that I recommend that she complete the course of treatment that I have provided for her.

## 2023-04-06 NOTE — ED Provider Notes (Addendum)
Kim Bentley    CSN: [id number] Arrival date & time: 04/06/23  1416    HISTORY   Chief Complaint  Patient presents with   Nausea   Cough   HPI Kim Bentley is a pleasant, 82 y.o. female who presents to urgent care today. Patient complains of chills, worsening of chronic cough, shortness of breath, fever with a Tmax of 101 and nausea x 2 days.  Patient states she vomited today.  Patient states she has been taking Aleve and Tylenol without meaningful relief.  Patient has a history of chronic cough, state taking Trelegy daily and albuterol PRN, last dose around noon today.  States she contacted her PCP office today complaining of shortness of breath and was advised to come to urgent care for further evaluation.  Patient has a temperature of 99.9 on arrival with an O2 sat of 91%, heart rate of 101 and a blood pressure of 165/75.  The history is provided by the patient.   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   Other chest pain 11/16/2021   ILD (interstitial lung disease) (HCC) 11/16/2021   Past Surgical History:  Procedure Laterality Date   BRONCHIAL BIOPSY  05/06/2022   Procedure: BRONCHIAL BIOPSIES;  Surgeon: Raechel Chute, MD;  Location: [facility];  Service: Pulmonary;;   BRONCHIAL NEEDLE ASPIRATION BIOPSY  05/06/2022   Procedure: BRONCHIAL NEEDLE ASPIRATION BIOPSIES;  Surgeon: Raechel Chute, MD;  Location: [facility];   Service: Pulmonary;;   COLONOSCOPY     COLONOSCOPY N/A 09/27/2021   Procedure: COLONOSCOPY;  Surgeon: Regis Bill, MD;  Location: [facility] ENDOSCOPY;  Service: Endoscopy;  Laterality: N/A;   COLONOSCOPY WITH PROPOFOL N/A 03/26/2018   Procedure: COLONOSCOPY WITH PROPOFOL;  Surgeon: Christena Deem, MD;  Location: [facility];  Service: Endoscopy;  Laterality: N/A;   ESOPHAGOGASTRODUODENOSCOPY     HERNIA REPAIR     TONSILLECTOMY     VIDEO BRONCHOSCOPY WITH RADIAL ENDOBRONCHIAL ULTRASOUND  05/06/2022   Procedure: VIDEO BRONCHOSCOPY WITH RADIAL ENDOBRONCHIAL ULTRASOUND;  Surgeon: Raechel Chute, MD;  Location: [facility]OSCOPY;  Service: Pulmonary;;   OB History   No obstetric history on file.    Home Medications    Prior to Admission medications   Medication Sig Start Date End Date Taking? Authorizing Provider  acetaminophen (TYLENOL) 500 MG tablet Take 500-1,000 mg by mouth every 8 (eight) hours as needed (pain.).    [provider]  albuterol (VENTOLIN HFA) 108 (90 Base) MCG/ACT inhaler Inhale 2 puffs into the lungs every 6 (six) hours as needed for wheezing or shortness of breath. 11/11/21   Chesley Noon, MD  Ascorbic Acid (VITAMIN C PO) Take 2,000 mg by mouth in the morning.    [provider]  aspirin 81 MG EC tablet Take 81 mg by mouth in the morning.    [provider]  budesonide-formoterol (  SYMBICORT) 80-4.5 MCG/ACT inhaler Inhale 2 puffs into the lungs every 4 (four) hours as needed. Patient taking differently: Inhale 2 puffs into the lungs every 4 (four) hours as needed (wheezing/respiratory issues.). 03/08/22   Raechel Chute, MD  Calcium Carb-Cholecalciferol (CALTRATE 600+D3 PO) Take 1 tablet by mouth in the morning.    [provider]  Cholecalciferol (VITAMIN D-3) 125 MCG (5000 UT) TABS Take 5,000 Units by mouth in the morning.    [provider]  Cyanocobalamin (VITAMIN B-12) 2500 MCG SUBL Take 2,500 mcg by mouth in the  morning.    [provider]  fluticasone (FLONASE) 50 MCG/ACT nasal spray Place 1 spray into both nostrils daily. 06/01/22 06/01/23  Raechel Chute, MD  Fluticasone-Umeclidin-Vilant (TRELEGY ELLIPTA) 100-62.5-25 MCG/ACT AEPB Inhale 1 puff into the lungs daily. 02/08/22   Raechel Chute, MD  hydroxychloroquine (PLAQUENIL) 200 MG tablet Take 200 mg by mouth 2 (two) times daily. 10/15/20   [provider]  levocetirizine (XYZAL) 5 MG tablet Take 1 tablet (5 mg total) by mouth every evening. 03/29/22 03/29/23  Raechel Chute, MD  methotrexate (RHEUMATREX) 2.5 MG tablet Take 12.5 mg by mouth every Friday. Patient not taking: Reported on 07/01/2022 10/15/20   [provider]  naproxen sodium (ALEVE) 220 MG tablet Take 440 mg by mouth daily as needed (pain.).    [provider]  Omega-3 Fatty Acids (FISH OIL PO) Take 1 capsule by mouth in the morning.    [provider]  Potassium 99 MG TABS Take 99 mg by mouth in the morning.    [provider]  valACYclovir (VALTREX) 1000 MG tablet Take 1,000-2,000 mg by mouth 2 (two) times daily as needed (cold sore/fever blister). 04/20/22   [provider]  zinc sulfate 220 (50 Zn) MG capsule Take 220 mg by mouth in the morning.    [provider]    Family History Family History  Problem Relation Age of Onset   Diabetes Mother    Obesity Mother    Breast cancer Sister 7   Healthy Brother    Brain cancer Brother    Social History Social History   Tobacco Use   Smoking status: Never    Passive exposure: Past   Smokeless tobacco: Never  Vaping Use   Vaping status: Never Used  Substance Use Topics   Alcohol use: Never   Drug use: Never   Allergies   Patient has no known allergies.  Review of Systems Review of Systems Pertinent findings revealed after performing a 14 point review of systems has been noted in the history of present illness.  Physical Exam Vital Signs BP (!) 165/75  (BP Location: Right Arm)   Pulse (!) 101   Temp 99.9 F (37.7 C) (Oral)   Resp 19   SpO2 91%   No data found.  Physical Exam Vitals and nursing note reviewed.  Constitutional:      General: She is not in acute distress.    Appearance: Normal appearance. She is not ill-appearing.  HENT:     Head: Normocephalic and atraumatic.     Salivary Glands: Right salivary gland is not diffusely enlarged or tender. Left salivary gland is not diffusely enlarged or tender.     Right Ear: Tympanic membrane, ear canal and external ear normal. No drainage. No middle ear effusion. There is no impacted cerumen. Tympanic membrane is not erythematous or bulging.     Left Ear: Tympanic membrane, ear canal and external ear normal. No  drainage.  No middle ear effusion. There is no impacted cerumen. Tympanic membrane is not erythematous or bulging.     Nose: Nose normal. No nasal deformity, septal deviation, mucosal edema, congestion or rhinorrhea.     Right Turbinates: Not enlarged, swollen or pale.     Left Turbinates: Not enlarged, swollen or pale.     Right Sinus: No maxillary sinus tenderness or frontal sinus tenderness.     Left Sinus: No maxillary sinus tenderness or frontal sinus tenderness.     Mouth/Throat:     Lips: Pink. No lesions.     Mouth: Mucous membranes are moist. No oral lesions.     Pharynx: Oropharynx is clear. Uvula midline. No posterior oropharyngeal erythema or uvula swelling.     Tonsils: No tonsillar exudate. 0 on the right. 0 on the left.  Eyes:     General: Lids are normal.        Right eye: No discharge.        Left eye: No discharge.     Extraocular Movements: Extraocular movements intact.     Conjunctiva/sclera: Conjunctivae normal.     Right eye: Right conjunctiva is not injected.     Left eye: Left conjunctiva is not injected.  Neck:     Trachea: Trachea and phonation normal.  Cardiovascular:     Rate and Rhythm: Normal rate and regular rhythm.     Pulses: Normal  pulses.     Heart sounds: Normal heart sounds. No murmur heard.    No friction rub. No gallop.  Pulmonary:     Effort: Pulmonary effort is normal. No accessory muscle usage, prolonged expiration or respiratory distress.     Breath sounds: No stridor, decreased air movement or transmitted upper airway sounds. Examination of the right-upper field reveals wheezing. Examination of the left-upper field reveals wheezing. Examination of the right-middle field reveals wheezing. Examination of the left-middle field reveals wheezing. Examination of the right-lower field reveals wheezing. Examination of the left-lower field reveals wheezing. Wheezing present. No decreased breath sounds, rhonchi or rales.     Comments: Repeat auscultation post DuoNeb treatment revealed decreased wheezing and improved work of breathing, and rales in right lower lung field Chest:     Chest wall: No tenderness.  Musculoskeletal:        General: Normal range of motion.     Cervical back: Normal range of motion and neck supple. Normal range of motion.  Lymphadenopathy:     Cervical: No cervical adenopathy.  Skin:    General: Skin is warm and dry.     Findings: No erythema or rash.  Neurological:     General: No focal deficit present.     Mental Status: She is alert and oriented to person, place, and time.  Psychiatric:        Mood and Affect: Mood normal.        Behavior: Behavior normal.     Visual Acuity Right Eye Distance:   Left Eye Distance:   Bilateral Distance:    Right Eye Near:   Left Eye Near:    Bilateral Near:     [facility] Couse / Diagnostics / Procedures:     Radiology DG Chest 2 View Result Date: 04/06/2023 CLINICAL DATA:  Decreased breath sounds on the right. Acute on chronic cough. EXAM: CHEST - 2 VIEW COMPARISON:  05/06/2022 and chest CT 05/02/2022 FINDINGS: New airspace densities in the right lower lung are suggestive for pneumonia. Focal retrocardiac density may represent normal lung  or vascular  markings. Poorly defined opacity in the medial right upper lung likely corresponds with known lesion in this area. Difficult to evaluate for interval change of this right upper lung lesion. Heart and mediastinum are within normal limits. Trachea is midline. No large pleural effusions. IMPRESSION: 1. New airspace densities in the right lower lung are suggestive for pneumonia. 2. Poorly defined opacity in the medial right upper lung likely corresponds with the known lesion. Difficult to evaluate for interval change. Electronically Signed   By: Richarda Overlie M.D.   On: 04/06/2023 16:22    Procedures Procedures (including critical care time) EKG  Pending results:  Labs Reviewed  POC COVID19/FLU A&B COMBO - Normal    Medications Ordered in UC: Medications  ipratropium-albuterol (DUONEB) 0.5-2.5 (3) MG/3ML nebulizer solution 3 mL (3 mLs Nebulization Given 04/06/23 1503)    UC Diagnoses / Final Clinical Impressions(s)   I have reviewed the triage vital signs and the nursing notes.  Pertinent labs & imaging results that were available during my care of the patient were reviewed by me and considered in my medical decision making (see chart for details).    Final diagnoses:  Shortness of breath  Lower respiratory tract infection  COPD exacerbation (HCC)  Patient advised of negative influenza and COVID-19 test results. Patient advised of chest x-ray findings concerning for lower respiratory tract infection.  This finding, in addition to COPD exacerbation, warrants treatment with antibiotics.  Patient provided with azithromycin and Augmentin.  Patient had significant improvement of breath sounds and work of breathing with DuoNeb treatment, have advised her to continue albuterol and to add Atrovent inhaler, both to be used every 4-6 hours as needed for cough, shortness of breath, increased work of breathing.  Close follow-up with PCP and/or pulmonology recommended.  Patient was notified that chest x-ray  was positive for findings concerning for right lower lobe pneumonia.  Please see discharge instructions below for details of plan of care as provided to patient. ED Prescriptions     Medication Sig Dispense Auth. Provider   amoxicillin-clavulanate (AUGMENTIN) 875-125 MG tablet Take 1 tablet by mouth 2 (two) times daily for 5 days. 10 tablet Theadora Rama Scales, PA-C   azithromycin (ZITHROMAX) 250 MG tablet Take 2 tablets (500 mg total) by mouth daily for 1 day, THEN 1 tablet (250 mg total) daily for 4 days. 6 tablet Theadora Rama Scales, PA-C   ipratropium (ATROVENT HFA) 17 MCG/ACT inhaler Inhale 2 puffs into the lungs every 4 (four) hours as needed for wheezing (SOB). 1 each Theadora Rama Scales, PA-C      PDMP not reviewed this encounter.  Pending results:  Labs Reviewed  POC COVID19/FLU A&B COMBO - Normal      Discharge Instructions      Your rapid influenza antigen test today was negative.  No further influenza testing is indicated.     Your COVID-19 test is negative.  Please consider retesting in the next 2 to 3 days, particularly if you are not feeling any better.  You are welcome to return here to urgent care to have it done or you can take a home COVID-19 test.    If both your COVID-19 tests are negative, then you can safely assume that your illness is due to one of the many less serious illnesses circulating in our community right now.     Conservative care is also recommended with rest, drinking plenty of clear fluids, eating only when hungry, taking supportive medications for your  symptoms and avoiding being around other people.  Please remain at home until you are fever free for 24 hours without the use of antifever medications such as Tylenol and ibuprofen.  Your chest x-ray is concerning for pneumonia in your right lower lobe.  I recommend that we treat you with antibiotics and an extra inhaler in addition to your Trelegy and your albuterol at this time.  Please  read below to learn more about the medications, dosages and frequencies that I recommend to help alleviate your symptoms and to get you feeling better soon:   Augmentin (amoxicillin - clavulanic acid):  Please take one (1) dose twice daily for 5 days.  This antibiotic can cause upset stomach, this will resolve once antibiotics are complete.  You are welcome to take a probiotic, eat yogurt, take Imodium while taking this medication.  Please avoid other systemic medications such as Maalox, Pepto-Bismol or milk of magnesia as they can interfere with the body's ability to absorb the antibiotics.    Z-Pak (azithromycin):  Please take two (2) tablets on day one and one tablet daily thereafter until the prescription is complete.This antibiotic can cause upset stomach, this will resolve once antibiotics are complete.  You are welcome to take a probiotic, eat yogurt, take Imodium while taking this medication.  Please avoid other systemic medications such as Maalox, Pepto-Bismol or milk of magnesia as they can interfere with the body's ability to absorb the antibiotics.       Atrovent (ipratropium): This inhaled medication contains a muscarinic bronchodilator.  This medication works on the smooth muscle that opens and constricts of your airways by relaxing the muscle.  The result of relaxation of the smooth muscle is increased air movement and improved work of breathing.  This is a short acting medication that can be used at the same time as your albuterol every 4-6 hours as needed for increased work of breathing, shortness of breath, wheezing and excessive coughing.     If symptoms have not meaningfully improved in the next 5 to 7 days, please return for repeat evaluation or follow-up with your regular provider.  If symptoms have worsened in the next 3 to 5 days, please return for repeat evaluation or follow-up with your regular provider.    Thank you for visiting urgent care today.  We appreciate the opportunity  to participate in your care.            Disposition Upon Discharge:  Condition: stable for discharge home  Patient presented with an acute illness with associated systemic symptoms and significant discomfort requiring urgent management. In my opinion, this is a condition that a prudent lay person (someone who possesses an average knowledge of health and medicine) may potentially expect to result in complications if not addressed urgently such as respiratory distress, impairment of bodily function or dysfunction of bodily organs.   Routine symptom specific, illness specific and/or disease specific instructions were discussed with the patient and/or caregiver at length.   As such, the patient has been evaluated and assessed, work-up was performed and treatment was provided in alignment with urgent care protocols and evidence based medicine.  Patient/parent/caregiver has been advised that the patient may require follow up for further testing and treatment if the symptoms continue in spite of treatment, as clinically indicated and appropriate.  Patient/parent/caregiver has been advised to return to the [facility] or PCP if no better; to PCP or the Emergency Department if new signs and symptoms develop, or if the current  signs or symptoms continue to change or worsen for further workup, evaluation and treatment as clinically indicated and appropriate  The patient will follow up with their current PCP if and as advised. If the patient does not currently have a PCP we will assist them in obtaining one.   The patient may need specialty follow up if the symptoms continue, in spite of conservative treatment and management, for further workup, evaluation, consultation and treatment as clinically indicated and appropriate.  Patient/parent/caregiver verbalized understanding and agreement of plan as discussed.  All questions were addressed during visit.  Please see discharge instructions below for further  details of plan.  This office note has been dictated using Teaching laboratory technician.  Unfortunately, this method of dictation can sometimes lead to typographical or grammatical errors.  I apologize for your inconvenience in advance if this occurs.  Please do not hesitate to reach out to me if clarification is needed.      Theadora Rama Scales, PA-C 04/06/23 1554    Theadora Rama Scales, PA-C 04/06/23 [id number]

## 2023-04-06 NOTE — Discharge Instructions (Addendum)
Your rapid influenza antigen test today was negative.  No further influenza testing is indicated.     Your COVID-19 test is negative.  Please consider retesting in the next 2 to 3 days, particularly if you are not feeling any better.  You are welcome to return here to urgent care to have it done or you can take a home COVID-19 test.    If both your COVID-19 tests are negative, then you can safely assume that your illness is due to one of the many less serious illnesses circulating in our community right now.     Conservative care is also recommended with rest, drinking plenty of clear fluids, eating only when hungry, taking supportive medications for your symptoms and avoiding being around other people.  Please remain at home until you are fever free for 24 hours without the use of antifever medications such as Tylenol and ibuprofen.  Your chest x-ray is concerning for pneumonia in your right lower lobe.  I recommend that we treat you with antibiotics and an extra inhaler in addition to your Trelegy and your albuterol at this time.  Please read below to learn more about the medications, dosages and frequencies that I recommend to help alleviate your symptoms and to get you feeling better soon:   Augmentin (amoxicillin - clavulanic acid):  Please take one (1) dose twice daily for 5 days.  This antibiotic can cause upset stomach, this will resolve once antibiotics are complete.  You are welcome to take a probiotic, eat yogurt, take Imodium while taking this medication.  Please avoid other systemic medications such as Maalox, Pepto-Bismol or milk of magnesia as they can interfere with the body's ability to absorb the antibiotics.    Z-Pak (azithromycin):  Please take two (2) tablets on day one and one tablet daily thereafter until the prescription is complete.This antibiotic can cause upset stomach, this will resolve once antibiotics are complete.  You are welcome to take a probiotic, eat yogurt, take  Imodium while taking this medication.  Please avoid other systemic medications such as Maalox, Pepto-Bismol or milk of magnesia as they can interfere with the body's ability to absorb the antibiotics.       Atrovent (ipratropium): This inhaled medication contains a muscarinic bronchodilator.  This medication works on the smooth muscle that opens and constricts of your airways by relaxing the muscle.  The result of relaxation of the smooth muscle is increased air movement and improved work of breathing.  This is a short acting medication that can be used at the same time as your albuterol every 4-6 hours as needed for increased work of breathing, shortness of breath, wheezing and excessive coughing.     If symptoms have not meaningfully improved in the next 5 to 7 days, please return for repeat evaluation or follow-up with your regular provider.  If symptoms have worsened in the next 3 to 5 days, please return for repeat evaluation or follow-up with your regular provider.    Thank you for visiting urgent care today.  We appreciate the opportunity to participate in your care.

## 2023-04-06 NOTE — ED Triage Notes (Signed)
Chills, cough, fever of 101 per pt, nausea x 3 days. Vomiting today. Took aleve and tylenol.

## 2023-04-14 ENCOUNTER — Ambulatory Visit: Payer: Medicare HMO | Admitting: Student in an Organized Health Care Education/Training Program

## 2023-04-14 ENCOUNTER — Encounter: Payer: Self-pay | Admitting: Student in an Organized Health Care Education/Training Program

## 2023-04-14 VITALS — BP 138/80 | HR 81 | Temp 98.0°F | Ht 61.0 in | Wt 175.6 lb

## 2023-04-14 DIAGNOSIS — J4540 Moderate persistent asthma, uncomplicated: Secondary | ICD-10-CM | POA: Diagnosis not present

## 2023-04-14 DIAGNOSIS — R918 Other nonspecific abnormal finding of lung field: Secondary | ICD-10-CM | POA: Diagnosis not present

## 2023-04-14 DIAGNOSIS — J4489 Other specified chronic obstructive pulmonary disease: Secondary | ICD-10-CM

## 2023-04-14 DIAGNOSIS — R053 Chronic cough: Secondary | ICD-10-CM

## 2023-04-14 DIAGNOSIS — J441 Chronic obstructive pulmonary disease with (acute) exacerbation: Secondary | ICD-10-CM

## 2023-04-14 MED ORDER — LEVOCETIRIZINE DIHYDROCHLORIDE 5 MG PO TABS: 5.0000 mg | ORAL_TABLET | Freq: Every evening | ORAL | 11 refills | Status: DC

## 2023-04-14 MED ORDER — TRELEGY ELLIPTA 200-62.5-25 MCG/ACT IN AEPB: 1.0000 | INHALATION_SPRAY | Freq: Every day | RESPIRATORY_TRACT | 11 refills | Status: DC

## 2023-04-14 MED ORDER — PREDNISONE 20 MG PO TABS: 20.0000 mg | ORAL_TABLET | Freq: Every day | ORAL | 0 refills | Status: AC

## 2023-04-14 NOTE — Progress Notes (Unsigned)
Synopsis: Referred in *** by Dorothey Baseman, MD  Assessment & Plan:   1. Obstructive chronic bronchitis without exacerbation (HCC) (Primary) *** - Fluticasone-Umeclidin-Vilant (TRELEGY ELLIPTA) 200-62.5-25 MCG/ACT AEPB; Inhale 1 puff into the lungs daily.  Dispense: 30 each; Refill: 11  2. COPD exacerbation (HCC) *** - Fluticasone-Umeclidin-Vilant (TRELEGY ELLIPTA) 200-62.5-25 MCG/ACT AEPB; Inhale 1 puff into the lungs daily.  Dispense: 30 each; Refill: 11 - predniSONE (DELTASONE) 20 MG tablet; Take 1 tablet (20 mg total) by mouth daily with breakfast for 5 days.  Dispense: 5 tablet; Refill: 0  3. Moderate persistent asthma without complication *** - Fluticasone-Umeclidin-Vilant (TRELEGY ELLIPTA) 200-62.5-25 MCG/ACT AEPB; Inhale 1 puff into the lungs daily.  Dispense: 30 each; Refill: 11 - predniSONE (DELTASONE) 20 MG tablet; Take 1 tablet (20 mg total) by mouth daily with breakfast for 5 days.  Dispense: 5 tablet; Refill: 0  4. Chronic cough *** - levocetirizine (XYZAL) 5 MG tablet; Take 1 tablet (5 mg total) by mouth every evening.  Dispense: 30 tablet; Refill: 11  5. Pulmonary mass *** - CT CHEST WO CONTRAST; Future   Recent presentation to urgent care, wheeze and cough. Every body in the family was sick (grand children and daughter). Improved with steroids and antibiotics. Will give short course of steroid today (mild wheeze). Not using symbicort. Will increase trelegy dose to 200. Repeat chest CT given one year mark.  Return in about 3 months (around 07/13/2023).  I spent *** minutes caring for this patient today, including {EM billing:[id number]}  Raechel Chute, MD [facility] 04/14/2023 10:40 AM    End of visit medications:  Meds ordered this encounter  Medications   Fluticasone-Umeclidin-Vilant (TRELEGY ELLIPTA) 200-62.5-25 MCG/ACT AEPB    Sig: Inhale 1 puff into the lungs daily.    Dispense:  30 each    Refill:  11   levocetirizine (XYZAL) 5  MG tablet    Sig: Take 1 tablet (5 mg total) by mouth every evening.    Dispense:  30 tablet    Refill:  11   predniSONE (DELTASONE) 20 MG tablet    Sig: Take 1 tablet (20 mg total) by mouth daily with breakfast for 5 days.    Dispense:  5 tablet    Refill:  0     Current Outpatient Medications:    acetaminophen (TYLENOL) 500 MG tablet, Take 500-1,000 mg by mouth every 8 (eight) hours as needed (pain.)., Disp: , Rfl:    albuterol (VENTOLIN HFA) 108 (90 Base) MCG/ACT inhaler, Inhale 2 puffs into the lungs every 6 (six) hours as needed for wheezing or shortness of breath., Disp: 8 g, Rfl: 0   Ascorbic Acid (VITAMIN C PO), Take 2,000 mg by mouth in the morning., Disp: , Rfl:    aspirin 81 MG EC tablet, Take 81 mg by mouth in the morning., Disp: , Rfl:    Calcium Carb-Cholecalciferol (CALTRATE 600+D3 PO), Take 1 tablet by mouth in the morning., Disp: , Rfl:    Cholecalciferol (VITAMIN D-3) 125 MCG (5000 UT) TABS, Take 5,000 Units by mouth in the morning., Disp: , Rfl:    Cyanocobalamin (VITAMIN B-12) 2500 MCG SUBL, Take 2,500 mcg by mouth in the morning., Disp: , Rfl:    fluticasone (FLONASE) 50 MCG/ACT nasal spray, Place 1 spray into both nostrils daily., Disp: 18.2 mL, Rfl: 6   Fluticasone-Umeclidin-Vilant (TRELEGY ELLIPTA) 200-62.5-25 MCG/ACT AEPB, Inhale 1 puff into the lungs daily., Disp: 30 each, Rfl: 11   hydroxychloroquine (PLAQUENIL) 200 MG tablet,  Take 200 mg by mouth 2 (two) times daily., Disp: , Rfl:    ipratropium (ATROVENT HFA) 17 MCG/ACT inhaler, Inhale 2 puffs into the lungs every 4 (four) hours as needed for wheezing (SOB)., Disp: 1 each, Rfl: 0   naproxen sodium (ALEVE) 220 MG tablet, Take 440 mg by mouth daily as needed (pain.)., Disp: , Rfl:    Omega-3 Fatty Acids (FISH OIL PO), Take 1 capsule by mouth in the morning., Disp: , Rfl:    Potassium 99 MG TABS, Take 99 mg by mouth in the morning., Disp: , Rfl:    predniSONE (DELTASONE) 20 MG tablet, Take 1 tablet (20 mg total) by  mouth daily with breakfast for 5 days., Disp: 5 tablet, Rfl: 0   valACYclovir (VALTREX) 1000 MG tablet, Take 1,000-2,000 mg by mouth 2 (two) times daily as needed (cold sore/fever blister)., Disp: , Rfl:    zinc sulfate 220 (50 Zn) MG capsule, Take 220 mg by mouth in the morning., Disp: , Rfl:    levocetirizine (XYZAL) 5 MG tablet, Take 1 tablet (5 mg total) by mouth every evening., Disp: 30 tablet, Rfl: 11   Subjective:   PATIENT ID: Kim Bentley GENDER: female DOB: [birth year], MRN: [id number]  Chief Complaint  Patient presents with   Follow-up    Cough. Shortness of breath on exertion. Wheezing. She has completed ABX prescribed at ED. Reports yeast infection. Requesting diflucan. Requesting refill on Xyzal.     HPI ***  Ancillary information including prior medications, full medical/surgical/family/social histories, and PFTs (when available) are listed below and have been reviewed.   ROS   Objective:   Vitals:   04/14/23 1012  BP: 138/80  Pulse: 81  Temp: 98 F (36.7 C)  TempSrc: Temporal  SpO2: 98%  Weight: 175 lb 9.6 oz (79.7 kg)  Height: 5\' 1"  (1.549 m)   98% on *** LPM *** RA BMI Readings from Last 3 Encounters:  04/14/23 33.18 kg/m  01/05/23 33.22 kg/m  07/01/22 32.95 kg/m   Wt Readings from Last 3 Encounters:  04/14/23 175 lb 9.6 oz (79.7 kg)  01/05/23 175 lb 12.8 oz (79.7 kg)  07/01/22 174 lb 6.4 oz (79.1 kg)    Physical Exam    Ancillary Information    Past Medical History:  Diagnosis Date   Allergic genetic state    Arthritis    Bladder spasms    COPD (chronic obstructive pulmonary disease) (HCC)    mild   COVID 2021   very mild   Dyspnea    GERD (gastroesophageal reflux disease)    History of kidney stones    HLD (hyperlipidemia)    Pneumonia    Pulmonary mass      Family History  Problem Relation Age of Onset   Diabetes Mother    Obesity Mother    Breast cancer Sister 84   Healthy Brother    Brain cancer Brother       Past Surgical History:  Procedure Laterality Date   BRONCHIAL BIOPSY  05/06/2022   Procedure: BRONCHIAL BIOPSIES;  Surgeon: Raechel Chute, MD;  Location: [facility] ENDOSCOPY;  Service: Pulmonary;;   BRONCHIAL NEEDLE ASPIRATION BIOPSY  05/06/2022   Procedure: BRONCHIAL NEEDLE ASPIRATION BIOPSIES;  Surgeon: Raechel Chute, MD;  Location: [facility] ENDOSCOPY;  Service: Pulmonary;;   COLONOSCOPY     COLONOSCOPY N/A 09/27/2021   Procedure: COLONOSCOPY;  Surgeon: Regis Bill, MD;  Location: [facility] ENDOSCOPY;  Service: Endoscopy;  Laterality: N/A;   COLONOSCOPY WITH PROPOFOL N/A 03/26/2018  Procedure: COLONOSCOPY WITH PROPOFOL;  Surgeon: Christena Deem, MD;  Location: [facility] END[facility]COPY;  Service: Endoscopy;  Laterality: N/A;   ESOPHAGOGASTRODUODENOSCOPY     HERNIA REPAIR     TONSILLECTOMY     VIDEO BRONCHOSCOPY WITH RADIAL ENDOBRONCHIAL ULTRASOUND  05/06/2022   Procedure: VIDEO BRONCHOSCOPY WITH RADIAL ENDOBRONCHIAL ULTRASOUND;  Surgeon: Raechel Chute, MD;  Location: [facility] ENDOSCOPY;  Service: Pulmonary;;    Social History   Socioeconomic History   Marital status: Widowed    Spouse name: Not on file   Number of children: Not on file   Years of education: Not on file   Highest education level: Not on file  Occupational History   Not on file  Tobacco Use   Smoking status: Never    Passive exposure: Past   Smokeless tobacco: Never  Vaping Use   Vaping status: Never Used  Substance and Sexual Activity   Alcohol use: Never   Drug use: Never   Sexual activity: Not on file  Other Topics Concern   Not on file  Social History Narrative   Lives alone, grandchild lives with her   Social Drivers of Health   Financial Resource Strain: Patient Declined (03/28/2022)   Received from [facility], [facility]   Overall Financial Resource Strain (CARDIA)    Difficulty of Paying Living Expenses: Patient declined  Food Insecurity: Patient Declined (03/28/2022)    Received from [facility], [facility]   Hunger Vital Sign    Worried About Running Out of Food in the Last Year: Patient declined    Ran Out of Food in the Last Year: Patient declined  Transportation Needs: Patient Declined (03/28/2022)   Received from [facility], [facility]   PRAPARE - Transportation    In the past 12 months, has lack of transportation kept you from medical appointments or from getting medications?: Patient declined    Lack of Transportation (Non-Medical): Patient declined  Physical Activity: Not on file  Stress: Not on file  Social Connections: Not on file  Intimate Partner Violence: Not on file     No Known Allergies   CBC    Component Value Date/Time   WBC 9.8 05/06/2022 0601   RBC 4.12 05/06/2022 0601   HGB 12.8 05/06/2022 0601   HGB 12.4 10/27/2011 0217   HCT 39.0 05/06/2022 0601   HCT 36.8 10/27/2011 0217   PLT 222 05/06/2022 0601   PLT 182 10/27/2011 0217   MCV 94.7 05/06/2022 0601   MCV 91 10/27/2011 0217   MCH 31.1 05/06/2022 0601   MCHC 32.8 05/06/2022 0601   RDW 13.9 05/06/2022 0601   RDW 12.9 10/27/2011 0217   LYMPHSABS 1.9 01/12/2022 1235   LYMPHSABS 2.3 10/27/2011 0217   MONOABS 0.5 01/12/2022 1235   MONOABS 0.7 10/27/2011 0217   EOSABS 0.2 01/12/2022 1235   EOSABS 0.2 10/27/2011 0217   BASOSABS 0.1 01/12/2022 1235   BASOSABS 0.0 10/27/2011 0217    Pulmonary Functions Testing Results:     No data to display          Outpatient Medications Prior to Visit  Medication Sig Dispense Refill   acetaminophen (TYLENOL) 500 MG tablet Take 500-1,000 mg by mouth every 8 (eight) hours as needed (pain.).     albuterol (VENTOLIN HFA) 108 (90 Base) MCG/ACT inhaler Inhale 2 puffs into the lungs every 6 (six) hours as needed for wheezing or shortness of breath. 8 g 0  Ascorbic Acid (VITAMIN C PO) Take 2,000 mg by mouth in the morning.     aspirin 81 MG EC tablet Take 81 mg  by mouth in the morning.     Calcium Carb-Cholecalciferol (CALTRATE 600+D3 PO) Take 1 tablet by mouth in the morning.     Cholecalciferol (VITAMIN D-3) 125 MCG (5000 UT) TABS Take 5,000 Units by mouth in the morning.     Cyanocobalamin (VITAMIN B-12) 2500 MCG SUBL Take 2,500 mcg by mouth in the morning.     fluticasone (FLONASE) 50 MCG/ACT nasal spray Place 1 spray into both nostrils daily. 18.2 mL 6   hydroxychloroquine (PLAQUENIL) 200 MG tablet Take 200 mg by mouth 2 (two) times daily.     ipratropium (ATROVENT HFA) 17 MCG/ACT inhaler Inhale 2 puffs into the lungs every 4 (four) hours as needed for wheezing (SOB). 1 each 0   naproxen sodium (ALEVE) 220 MG tablet Take 440 mg by mouth daily as needed (pain.).     Omega-3 Fatty Acids (FISH OIL PO) Take 1 capsule by mouth in the morning.     Potassium 99 MG TABS Take 99 mg by mouth in the morning.     valACYclovir (VALTREX) 1000 MG tablet Take 1,000-2,000 mg by mouth 2 (two) times daily as needed (cold sore/fever blister).     zinc sulfate 220 (50 Zn) MG capsule Take 220 mg by mouth in the morning.     Fluticasone-Umeclidin-Vilant (TRELEGY ELLIPTA) 100-62.5-25 MCG/ACT AEPB Inhale 1 puff into the lungs daily. 1 each 11   levocetirizine (XYZAL) 5 MG tablet Take 1 tablet (5 mg total) by mouth every evening. 30 tablet 11   budesonide-formoterol (SYMBICORT) 80-4.5 MCG/ACT inhaler Inhale 2 puffs into the lungs every 4 (four) hours as needed. (Patient not taking: Reported on 04/14/2023) 1 each 12   methotrexate (RHEUMATREX) 2.5 MG tablet Take 12.5 mg by mouth every Friday. (Patient not taking: Reported on 07/01/2022)     No facility-administered medications prior to visit.

## 2023-06-06 ENCOUNTER — Encounter: Payer: Self-pay | Admitting: Internal Medicine

## 2023-06-06 ENCOUNTER — Ambulatory Visit: Payer: Medicare Other | Admitting: Internal Medicine

## 2023-06-06 VITALS — BP 136/66 | HR 85 | Temp 98.1°F | Ht 61.0 in | Wt 178.8 lb

## 2023-06-06 DIAGNOSIS — J40 Bronchitis, not specified as acute or chronic: Secondary | ICD-10-CM | POA: Diagnosis not present

## 2023-06-06 MED ORDER — ALBUTEROL SULFATE HFA 108 (90 BASE) MCG/ACT IN AERS: 2.0000 | INHALATION_SPRAY | Freq: Four times a day (QID) | RESPIRATORY_TRACT | 0 refills | Status: AC | PRN

## 2023-06-06 MED ORDER — AZITHROMYCIN 250 MG PO TABS: ORAL_TABLET | ORAL | 0 refills | Status: DC

## 2023-06-06 MED ORDER — PREDNISONE 20 MG PO TABS: 20.0000 mg | ORAL_TABLET | Freq: Every day | ORAL | 1 refills | Status: DC

## 2023-06-06 MED ORDER — IPRATROPIUM BROMIDE HFA 17 MCG/ACT IN AERS: 2.0000 | INHALATION_SPRAY | RESPIRATORY_TRACT | 0 refills | Status: DC | PRN

## 2023-06-06 NOTE — Progress Notes (Signed)
   CHIEF COMPLAINT:   Chief Complaint  Patient presents with   Cough    Chest congestion, cough, and hoarseness x 1 week. Shortness of breath and wheezing.     Subjective  ACUTE VISIT FOR ACUTE VIRAL BRONCHITIS +wheezing Not feeling well Increased work of breathing and shortness of breath She is not in any respiratory compromise at this time I recommended prednisone and antibiotic therapy        Objective   BP 136/66 (BP Location: Left Arm, Patient Position: Sitting, Cuff Size: Normal)   Pulse 85   Temp 98.1 F (36.7 C) (Oral)   Ht 5\' 1"  (1.549 m)   Wt 178 lb 12.8 oz (81.1 kg)   SpO2 96%   BMI 33.78 kg/m   Review of Systems: Gen:  Denies  fever, sweats, chills weight loss  HEENT: Denies blurred vision, double vision, ear pain, eye pain, hearing loss, nose bleeds, sore throat Cardiac:  No dizziness, chest pain or heaviness, chest tightness,edema, No JVD Resp:   + cough, +sputum production, +shortness of breath,+wheezing, -hemoptysis,  Other:  All other systems negative   Physical Examination:   General Appearance: No distress  EYES PERRLA, EOM intact.   NECK Supple, No JVD Pulmonary: normal breath sounds, + I offered 109 and she said no wheezing.  CardiovascularNormal S1,S2.  No m/r/g.   Abdomen: Benign, Soft, non-tender. Neurology UE/LE 5/5 strength, no focal deficits Ext pulses intact, cap refill intact ALL OTHER ROS ARE NEGATIVE     VITALS:  height is 5\' 1"  (1.549 m) and weight is 178 lb 12.8 oz (81.1 kg). Her oral temperature is 98.1 F (36.7 C). Her blood pressure is 136/66 and her pulse is 85. Her oxygen saturation is 96%.     Assessment/Plan:  ACUTE VIRAL BRONCHITIS WITH WHEEZING Prednisone 20 mg daily for 7 days Z-Pak Use albuterol nebulizers every 6 hours while awake for the next 3 days and then use as needed Recommend ER visit if patient's symptoms worsen    MEDICATION ADJUSTMENTS/LABS AND TESTS ORDERED:    CURRENT MEDICATIONS  REVIEWED AT LENGTH WITH PATIENT TODAY   Patient  satisfied with Plan of action and management. All questions answered   Follow up  with Dr Donney Dice in 3 months   I spent a total of 45 minutes reviewing chart data, face-to-face evaluation with the patient, counseling and coordination of care as detailed above.      Lucie Leather, M.D.  [facility]  Medical Director [facility] Medical Director [facility] Cardio-Pulmonary Department

## 2023-06-06 NOTE — Patient Instructions (Signed)
Start prednisone 20 mg daily for 7 days Start Z-Pak which is an antibiotic  Plan for albuterol nebulizer therapy every 6 hours while awake for the next 3 days Then use as needed  Avoid Allergens and Irritants Avoid secondhand smoke Avoid SICK contacts Recommend  Masking  when appropriate Recommend Keep up-to-date with vaccinations

## 2023-06-09 ENCOUNTER — Telehealth: Payer: Self-pay | Admitting: Student in an Organized Health Care Education/Training Program

## 2023-06-09 NOTE — Telephone Encounter (Signed)
Patient seen Tuesday 2/11 with kasa as an acute visit, still not better with antibiotics and prednisone. Requesting more medication through the weekend. Please call back to advise.

## 2023-06-09 NOTE — Telephone Encounter (Signed)
Patient reports her symptoms are not improving with prednisone or azithromycin. She is using nebulizer every 6 hours and trelegy daily. She denies any fever. Reports cough with white phlegm, shortness of breath and wheezing. Please advise. Patient is requesting a stronger medication. Reports she is scheduled for CT Chest on 06/12/2023 and wants to feel better before she scan is done. Please advise.

## 2023-06-09 NOTE — Telephone Encounter (Signed)
Patient advised as below. Nothing further needed.

## 2023-06-09 NOTE — Telephone Encounter (Signed)
She has failed outpatient therapy.  She was advised by Dr. Belia Heman that if she was not any better she needed to be seen in the emergency room.  I recommend evaluation in the emergency room as they can do imaging and see if there is anything else going on.

## 2023-06-12 ENCOUNTER — Ambulatory Visit
Admission: RE | Admit: 2023-06-12 | Discharge: 2023-06-12 | Disposition: A | Discharge status: Home / Self Care | Payer: Medicare Other | Source: Ambulatory Visit | Attending: Student in an Organized Health Care Education/Training Program | Admitting: Student in an Organized Health Care Education/Training Program

## 2023-06-12 DIAGNOSIS — R918 Other nonspecific abnormal finding of lung field: Secondary | ICD-10-CM | POA: Insufficient documentation

## 2023-06-20 ENCOUNTER — Telehealth: Payer: Self-pay | Admitting: Pharmacist

## 2023-06-20 ENCOUNTER — Encounter: Payer: Self-pay | Admitting: Student in an Organized Health Care Education/Training Program

## 2023-06-20 ENCOUNTER — Telehealth: Payer: Self-pay

## 2023-06-20 ENCOUNTER — Ambulatory Visit: Payer: Medicare Other | Admitting: Student in an Organized Health Care Education/Training Program

## 2023-06-20 VITALS — BP 132/80 | HR 75 | Temp 97.6°F | Ht 61.0 in | Wt 176.6 lb

## 2023-06-20 DIAGNOSIS — J4540 Moderate persistent asthma, uncomplicated: Secondary | ICD-10-CM

## 2023-06-20 DIAGNOSIS — R918 Other nonspecific abnormal finding of lung field: Secondary | ICD-10-CM | POA: Diagnosis not present

## 2023-06-20 DIAGNOSIS — R053 Chronic cough: Secondary | ICD-10-CM | POA: Diagnosis not present

## 2023-06-20 DIAGNOSIS — J4489 Other specified chronic obstructive pulmonary disease: Secondary | ICD-10-CM | POA: Diagnosis not present

## 2023-06-20 NOTE — Telephone Encounter (Addendum)
 Received will start [place] BIV. Forms received.

## 2023-06-20 NOTE — Progress Notes (Signed)
 Assessment & Plan:   #Moderate persistent asthma without complication #Chronic Cough   Patient has a history of obstruction on PFT's from 12/13/2021 with scooping of the expiratory flow volume loop. While her ratio is preserved (73%), she does have an impaired FEV1 of 1.19 at 68% predicted. 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 She also has a strong family history of asthma which is likely a very strong contributor to her symptoms. FVC is decreased and so is TLC, suggesting mild restriction. DLCO was within normal. CT high resolution on 11/23/2021 did not show any signs of ILD. 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    She is currently on high dose Trelegy at 200-62.5-25 and continues to use her albuterol regularly. She has wheeze on exam and has had multiple exacerbations over the past year, and I am concerned about the high dose trelegy she is receiving. Given this, I will attempt to initiate biologics with Tezepelumab for better control of her asthma.   - Fluticasone-Umeclidin-Vilant (TRELEGY ELLIPTA) 200-62.5-25 MCG/ACT AEPB; Inhale 1 puff into the lungs daily.  Dispense: 30 each; Refill: 11 - start tezepelumab - continue levocetirizine for cough   #Pulmonary mass   Presented with both RUL and RLL masses that are mildly FDG avid and with associated lymphadenopathy. She underwent robotic assisted navigational bronchoscopy to the RLL and RUL mass and cytology is benign. This is likely a benign mass/nodule in the setting of immune suppression. She has stopped her methotrexate and we will continue to monitor the masses for growth or change. Repeat CT ordered and performed, read pending. Should there be growth in the size of the mass, will consider CT guided biopsy to establish a diagnosis.  Return in about 3 months (around 09/17/2023).  I spent 30 minutes caring for this patient today, including preparing to see the patient,  obtaining a medical history , reviewing a separately obtained history, performing a medically appropriate examination and/or evaluation, counseling and educating the patient/family/caregiver, ordering medications, tests, or procedures, and documenting clinical information in the electronic health record  Raechel Chute, MD [facility]  End of visit medications:  No orders of the defined types were placed in this encounter.    Current Outpatient Medications:    albuterol (VENTOLIN HFA) 108 (90 Base) MCG/ACT inhaler, Inhale 2 puffs into the lungs every 6 (six) hours as needed for wheezing or shortness of breath., Disp: 8 g, Rfl: 0   Ascorbic Acid (VITAMIN C PO), Take 2,000 mg by mouth in the morning., Disp: , Rfl:    aspirin 81 MG EC tablet, Take 81 mg by mouth in the morning., Disp: , Rfl:    Calcium Carb-Cholecalciferol (CALTRATE 600+D3 PO), Take 1 tablet by mouth in the morning., Disp: , Rfl:    Cholecalciferol (VITAMIN D-3) 125 MCG (5000 UT) TABS, Take 5,000 Units by mouth in the morning., Disp: , Rfl:    Cyanocobalamin (VITAMIN B-12) 2500 MCG SUBL, Take 2,500 mcg by mouth in the morning., Disp: , Rfl:    fluticasone (FLONASE) 50 MCG/ACT nasal spray, Place 1 spray into both nostrils daily., Disp: 18.2 mL, Rfl: 6   Fluticasone-Umeclidin-Vilant (TRELEGY ELLIPTA) 200-62.5-25 MCG/ACT AEPB, Inhale 1 puff into the lungs daily., Disp: 30 each, Rfl: 11   hydroxychloroquine (PLAQUENIL) 200 MG tablet, Take 200 mg by mouth 2 (two) times daily., Disp: , Rfl:    ipratropium (ATROVENT HFA) 17 MCG/ACT inhaler, Inhale 2 puffs into the lungs  every 4 (four) hours as needed for wheezing (SOB)., Disp: 1 each, Rfl: 0   levocetirizine (XYZAL) 5 MG tablet, Take 1 tablet (5 mg total) by mouth every evening., Disp: 30 tablet, Rfl: 11   naproxen sodium (ALEVE) 220 MG tablet, Take 440 mg by mouth daily as needed (pain.)., Disp: , Rfl:    Omega-3 Fatty Acids (FISH OIL PO), Take 1 capsule by mouth in  the morning., Disp: , Rfl:    Potassium 99 MG TABS, Take 99 mg by mouth in the morning., Disp: , Rfl:    valACYclovir (VALTREX) 1000 MG tablet, Take 1,000-2,000 mg by mouth 2 (two) times daily as needed (cold sore/fever blister)., Disp: , Rfl:    zinc sulfate 220 (50 Zn) MG capsule, Take 220 mg by mouth in the morning., Disp: , Rfl:    acetaminophen (TYLENOL) 500 MG tablet, Take 500-1,000 mg by mouth every 8 (eight) hours as needed (pain.). (Patient not taking: Reported on 06/20/2023), Disp: , Rfl:    Subjective:   PATIENT ID: Kim Bentley GENDER: female DOB: 1940/09/01, MRN: [id number]  Chief Complaint  Patient presents with   Follow-up    Cough, shortness of breath on exertion and occasional wheezing.      HPI  Kim Bentley is a pleasant 83 year old female patient presenting to clinic for follow up. She was recently seen in our clinic for an acute visit with an exacerbation that required a course of prednisone.   She fell ill earlier in the month with increased shortness of breath, wheeze, and cough. She thinks her grand child might have been ill but his symptoms were very mild. She had increased cough and shortness of breath prompting presentation to clinic where she was noted to have a wheeze, increased work of breathing, and overall illness for a week in duration. She was prescribed a course of prednisone as well as a course of azithromycin. She continues to feel unwell and is not back to her baseline. She'd had multiple exacerbations over the past year requiring prednisone. She continues to be compliant with high dose trelegy.   Patient underwent repeat robotic assisted navigational bronchoscopy with biopsy of masses in her RUL and RLL in January of 2024. The pathology result has returned benign, and culture has grown H. Influenza, now s/p a course of antibiotics. She was incidentally found to have a pulmonary mass after presenting with shortness of breath following a fall. She then  underwent a PET/CT showing low FDG avidity in both masses. The CT scan was also notable for lymphadenopathy (4R and 7 on my review). She was referred to oncology and saw Dr. Donneta Romberg and was subsequently presented in tumor board and was subsequently referred for biopsy.   She underwent robotic assisted navigational bronchoscopy on 01/21/2022 to the RLL mass, in addition EBUS to multiple stations. The results from the biopsy has returned benign, with cultures growing Ralstonia Picketti. Similarly, result from the EBUS has been negative for malignancy. Repeat Navigational Bronchoscopy on 05/06/2022 showed benign cytology and cultures with H. Influenza. EBUS was not repeated during said procedure.   In regards to her RA, she has followed with rheumatology. Patient is off methotrexate and continues on hydrxychloroquine. She is a non-smoker.  Ancillary information including prior medications, full medical/surgical/family/social histories, and PFTs (when available) are listed below and have been reviewed.   Review of Systems  Constitutional:  Negative for chills and fever.  Respiratory:  Positive for cough, shortness of breath and wheezing.  Cardiovascular:  Negative for chest pain.  Skin:  Negative for rash.     Objective:   Vitals:   06/20/23 1003  BP: 132/80  Pulse: 75  Temp: 97.6 F (36.4 C)  TempSrc: Temporal  SpO2: 97%  Weight: 176 lb 9.6 oz (80.1 kg)  Height: 5\' 1"  (1.549 m)   97% on RA  BMI Readings from Last 3 Encounters:  06/20/23 33.37 kg/m  06/06/23 33.78 kg/m  04/14/23 33.18 kg/m   Wt Readings from Last 3 Encounters:  06/20/23 176 lb 9.6 oz (80.1 kg)  06/06/23 178 lb 12.8 oz (81.1 kg)  04/14/23 175 lb 9.6 oz (79.7 kg)    Physical Exam Vitals reviewed.  Constitutional:      Appearance: Normal appearance. She is obese.  HENT:     Nose: Nose normal.     Mouth/Throat:     Mouth: Mucous membranes are moist.  Eyes:     Pupils: Pupils are equal, round, and  reactive to light.  Cardiovascular:     Rate and Rhythm: Normal rate and regular rhythm.     Pulses: Normal pulses.     Heart sounds: Normal heart sounds.  Pulmonary:     Effort: Pulmonary effort is normal.     Breath sounds: Wheezing present. No rhonchi.  Abdominal:     General: There is distension.     Palpations: Abdomen is soft.  Musculoskeletal:     Cervical back: Normal range of motion.  Skin:    General: Skin is warm.  Neurological:     General: No focal deficit present.     Mental Status: She is alert and oriented to person, place, and time. Mental status is at baseline.       Ancillary Information    Past Medical History:  Diagnosis Date   Allergic genetic state    Arthritis    Bladder spasms    COPD (chronic obstructive pulmonary disease) (HCC)    mild   COVID 2021   very mild   Dyspnea    GERD (gastroesophageal reflux disease)    History of kidney stones    HLD (hyperlipidemia)    Pneumonia    Pulmonary mass      Family History  Problem Relation Age of Onset   Diabetes Mother    Obesity Mother    Breast cancer Sister 22   Healthy Brother    Brain cancer Brother      Past Surgical History:  Procedure Laterality Date   BRONCHIAL BIOPSY  05/06/2022   Procedure: BRONCHIAL BIOPSIES;  Surgeon: Raechel Chute, MD;  Location: [facility] ENDOSCOPY;  Service: Pulmonary;;   BRONCHIAL NEEDLE ASPIRATION BIOPSY  05/06/2022   Procedure: BRONCHIAL NEEDLE ASPIRATION BIOPSIES;  Surgeon: Raechel Chute, MD;  Location: [facility] ENDOSCOPY;  Service: Pulmonary;;   COLONOSCOPY     COLONOSCOPY N/A 09/27/2021   Procedure: COLONOSCOPY;  Surgeon: Regis Bill, MD;  Location: [facility] ENDOSCOPY;  Service: Endoscopy;  Laterality: N/A;   COLONOSCOPY WITH PROPOFOL N/A 03/26/2018   Procedure: COLONOSCOPY WITH PROPOFOL;  Surgeon: Christena Deem, MD;  Location: [facility] ENDOSCOPY;  Service: Endoscopy;  Laterality: N/A;   ESOPHAGOGASTRODUODENOSCOPY     HERNIA REPAIR     TONSILLECTOMY      VIDEO BRONCHOSCOPY WITH RADIAL ENDOBRONCHIAL ULTRASOUND  05/06/2022   Procedure: VIDEO BRONCHOSCOPY WITH RADIAL ENDOBRONCHIAL ULTRASOUND;  Surgeon: Raechel Chute, MD;  Location: [facility] ENDOSCOPY;  Service: Pulmonary;;    Social History   Socioeconomic History   Marital status: Widowed  Spouse name: Not on file   Number of children: Not on file   Years of education: Not on file   Highest education level: Not on file  Occupational History   Not on file  Tobacco Use   Smoking status: Never    Passive exposure: Past   Smokeless tobacco: Never  Vaping Use   Vaping status: Never Used  Substance and Sexual Activity   Alcohol use: Never   Drug use: Never   Sexual activity: Not on file  Other Topics Concern   Not on file  Social History Narrative   Lives alone, grandchild lives with her   Social Drivers of Health   Financial Resource Strain: Patient Declined (03/28/2022)   Received from [facility], [facility]   Overall Financial Resource Strain (CARDIA)    Difficulty of Paying Living Expenses: Patient declined  Food Insecurity: Patient Declined (03/28/2022)   Received from [facility], [facility]   Hunger Vital Sign    Worried About Running Out of Food in the Last Year: Patient declined    Ran Out of Food in the Last Year: Patient declined  Transportation Needs: Patient Declined (03/28/2022)   Received from [facility], [facility]   PRAPARE - Transportation    In the past 12 months, has lack of transportation kept you from medical appointments or from getting medications?: Patient declined    Lack of Transportation (Non-Medical): Patient declined  Physical Activity: Not on file  Stress: Not on file  Social Connections: Not on file  Intimate Partner Violence: Not on file     No Known Allergies   CBC    Component Value Date/Time   WBC 9.8 05/06/2022 0601   RBC 4.12  05/06/2022 0601   HGB 12.8 05/06/2022 0601   HGB 12.4 10/27/2011 0217   HCT 39.0 05/06/2022 0601   HCT 36.8 10/27/2011 0217   PLT 222 05/06/2022 0601   PLT 182 10/27/2011 0217   MCV 94.7 05/06/2022 0601   MCV 91 10/27/2011 0217   MCH 31.1 05/06/2022 0601   MCHC 32.8 05/06/2022 0601   RDW 13.9 05/06/2022 0601   RDW 12.9 10/27/2011 0217   LYMPHSABS 1.9 01/12/2022 1235   LYMPHSABS 2.3 10/27/2011 0217   MONOABS 0.5 01/12/2022 1235   MONOABS 0.7 10/27/2011 0217   EOSABS 0.2 01/12/2022 1235   EOSABS 0.2 10/27/2011 0217   BASOSABS 0.1 01/12/2022 1235   BASOSABS 0.0 10/27/2011 0217    Pulmonary Functions Testing Results:     No data to display          Outpatient Medications Prior to Visit  Medication Sig Dispense Refill   albuterol (VENTOLIN HFA) 108 (90 Base) MCG/ACT inhaler Inhale 2 puffs into the lungs every 6 (six) hours as needed for wheezing or shortness of breath. 8 g 0   Ascorbic Acid (VITAMIN C PO) Take 2,000 mg by mouth in the morning.     aspirin 81 MG EC tablet Take 81 mg by mouth in the morning.     Calcium Carb-Cholecalciferol (CALTRATE 600+D3 PO) Take 1 tablet by mouth in the morning.     Cholecalciferol (VITAMIN D-3) 125 MCG (5000 UT) TABS Take 5,000 Units by mouth in the morning.     Cyanocobalamin (VITAMIN B-12) 2500 MCG SUBL Take 2,500 mcg by mouth in the morning.     fluticasone (FLONASE) 50 MCG/ACT nasal spray Place 1 spray into both nostrils daily. 18.2  mL 6   Fluticasone-Umeclidin-Vilant (TRELEGY ELLIPTA) 200-62.5-25 MCG/ACT AEPB Inhale 1 puff into the lungs daily. 30 each 11   hydroxychloroquine (PLAQUENIL) 200 MG tablet Take 200 mg by mouth 2 (two) times daily.     ipratropium (ATROVENT HFA) 17 MCG/ACT inhaler Inhale 2 puffs into the lungs every 4 (four) hours as needed for wheezing (SOB). 1 each 0   levocetirizine (XYZAL) 5 MG tablet Take 1 tablet (5 mg total) by mouth every evening. 30 tablet 11   naproxen sodium (ALEVE) 220 MG tablet Take 440 mg by  mouth daily as needed (pain.).     Omega-3 Fatty Acids (FISH OIL PO) Take 1 capsule by mouth in the morning.     Potassium 99 MG TABS Take 99 mg by mouth in the morning.     valACYclovir (VALTREX) 1000 MG tablet Take 1,000-2,000 mg by mouth 2 (two) times daily as needed (cold sore/fever blister).     zinc sulfate 220 (50 Zn) MG capsule Take 220 mg by mouth in the morning.     acetaminophen (TYLENOL) 500 MG tablet Take 500-1,000 mg by mouth every 8 (eight) hours as needed (pain.). (Patient not taking: Reported on 06/20/2023)     azithromycin (ZITHROMAX Z-PAK) 250 MG tablet Take 2 tablets on Day 1 and then 1 tablet daily till gone. (Patient not taking: Reported on 06/20/2023) 6 each 0   predniSONE (DELTASONE) 20 MG tablet Take 1 tablet (20 mg total) by mouth daily with breakfast. 7 days (Patient not taking: Reported on 06/20/2023) 7 tablet 1   No facility-administered medications prior to visit.

## 2023-06-20 NOTE — Telephone Encounter (Signed)
 Patient was seen in office today. Dr. Aundria Rud has ordered [facility] for the patient. Patient has signed forms and they have been faxed to pharmacy team for completion.

## 2023-06-20 NOTE — Telephone Encounter (Signed)
 Received new start paperwork for Tezspire  Submitted a Prior Authorization request to [facility] for TEZSPIRE via CoverMyMeds. Will update once we receive a response.  Key: [facility]

## 2023-06-26 NOTE — Telephone Encounter (Signed)
 Patient sent in a mychart message, "I was in there on February 25 and Dr. Aundria Rud gave me a prescription for Tezspire and have not heard from anyone in reference to getting it. Thank you." Please advise

## 2023-07-04 ENCOUNTER — Other Ambulatory Visit (HOSPITAL_COMMUNITY): Payer: Self-pay

## 2023-07-04 NOTE — Telephone Encounter (Signed)
 Received notification from [facility] regarding a prior authorization for TEZSPIRE. Authorization has been APPROVED from 06/20/2023 to 04/24/2024. Approval letter sent to scan center.  Per test claim, copay for 28 days supply is $1366.73  Authorization # [id number]  Called patient to notify. Will move forward with pt assistance application.Forms placed on Brighton's desk  Chesley Mires, PharmD, MPH, BCPS, CPP Clinical Pharmacist (Rheumatology and Pulmonology)

## 2023-07-14 ENCOUNTER — Other Ambulatory Visit (HOSPITAL_COMMUNITY): Payer: Self-pay

## 2023-07-14 NOTE — Telephone Encounter (Signed)
 Submitted Patient Assistance Application to [employer] for [employer] along with provider portion, patient portion, PA, medication list, and insurance card copy. Will update patient when we receive a response.  Phone# [phone] Fax# [phone]

## 2023-07-18 NOTE — Telephone Encounter (Signed)
 Tezspire together is missing the first page of the PA form. They also need updated consent for the patient. Please refax to [phone]

## 2023-07-21 NOTE — Telephone Encounter (Signed)
 Kim Bentley calling from the patient assistance program to check the status of a request sent over . [phone]  Fax number [phone] .updated consent forms

## 2023-07-25 NOTE — Telephone Encounter (Signed)
 Also wanted to include the cost estimator this has been the third attempt . But you will close the case but once we will received the information needed will reopen case too

## 2023-07-25 NOTE — Telephone Encounter (Signed)
 Kim Bentley calling from patient assistance program tezspire together this concerning the request that doctor permitted for the medication  and just calling to follow up about the updated consents that are needed and have not received anything back  [phone] or just fax over the forms to my fax number which is [phone]

## 2023-07-27 ENCOUNTER — Telehealth: Payer: Self-pay

## 2023-07-27 NOTE — Telephone Encounter (Signed)
 Patient was seen in the office on 2/25. You went over the CT results while she was in the office but the official read was not back. They came back on 3/17. Is she still following up in 3 month? Does she need to have any imaging before then?

## 2023-07-27 NOTE — Telephone Encounter (Signed)
 Copied from CRM [id number]. Topic: Clinical - Lab/Test Results >> Jul 27, 2023 11:15 AM Adele Barthel wrote: Reason for CRM:   Patient is calling in regarding her chest CT performed on 02/17. She has not followed up with Dr. Aundria Rud regarding the scan. She was seen by her rheumatologist, Dr Allena Katz, yesterday and after pulling up the scan he recommended she come in to the pulmonologist soon.   She is already scheduled for 06/06 and was unable to find her sooner appt. Patient is concerned due to her rheumatologist recommending follow up urgently.   Requests call back  # [phone]

## 2023-07-28 ENCOUNTER — Ambulatory Visit: Payer: Self-pay | Admitting: Student in an Organized Health Care Education/Training Program

## 2023-07-28 NOTE — Telephone Encounter (Signed)
 Patient advised that Dr. Aundria Rud is in the ICU this week. And that we will call her back once Dr. Aundria Rud has reviewed her message.

## 2023-07-28 NOTE — Telephone Encounter (Signed)
  Chief Complaint: information only Additional Notes: Pt requesting CB from Dr. Aundria Rud re: CT results from February. Pt reports going to rheumatologist yesterday that reviewed her scan and told pt to F/U with pulm ASAP re: new lung nodule noted on scan. Of note, pt called yesterday with same concern. Pt is anxiously awaiting CB.  Additionally, pt also reports vision changes with taking Trelegy and has stopped taking INH x 2 weeks-- see triage. Pt endorses that she is due for an eye exam and will schedule appt with optho. Triager reinforced pt of dispo. Patient verbalized understanding and to call back with worsening symptoms.    Copied from CRM [id number]. Topic: Clinical - Red Word Triage >> Jul 28, 2023  9:49 AM Payton Doughty wrote: Red Word that prompted transfer to Nurse Triage: pt concerned about CT results and was expecting a cb yesterday Reason for Disposition  [1] Blurred vision or visual changes AND [2] gradual onset (e.g., weeks, months)  Answer Assessment - Initial Assessment Questions 1. REASON FOR CALL or QUESTION: "What is your reason for calling today?" or "How can I best help you?" or "What question do you have that I can help answer?"     In office in February for "C Scan" but has not talked to Dr. Algis Downs about it-- Micah Flesher to rheum yesterday and that MD discussed CT results then, notifying pt of new lung nodule. -- pt requesting Dr. Algis Downs to F/U ASAP. Pt reports on Trelegy, and ordered a "shot"-- the shot was expensive and reports was supposed to get some kind of financial assistance but has not heard back. Pt also reports stopping Trelegy (two weeks) and stop d/t eye concerns and is taking albuterol neb instead (1-2 a day PRN).  Answer Assessment - Initial Assessment Questions 1. DESCRIPTION: "How has your vision changed?" (e.g., complete vision loss, blurred vision, double vision, floaters, etc.)     Not as clear, water a lot Has a hard time reading -- pt does endorse needing to see optho 2.  LOCATION: "One or both eyes?" If one, ask: "Which eye?"     both 3. SEVERITY: "Can you see anything?" If Yes, ask: "What can you see?" (e.g., fine print)     Affects reading 4. ONSET: "When did this begin?" "Did it start suddenly or has this been gradual?"     Noticed while taking trelegy - reports watery eyes improved after stopping but still has heard time with reading 5. PATTERN: "Does this come and go, or has it been constant since it started?"     Constant since stopped trelegy - but watery eyes has stopped 6. PAIN: "Is there any pain in your eye(s)?"  (Scale 1-10; or mild, moderate, severe)   - NONE (0): No pain.   - MILD (1-3): Doesn't interfere with normal activities.   - MODERATE (4-7): Interferes with normal activities or awakens from sleep.    - SEVERE (8-10): Excruciating pain, unable to do any normal activities.     Denies pain 7. CONTACTS-GLASSES: "Do you wear contacts or glasses?"     Glasses - reports it's time to see optho 8. CAUSE: "What do you think is causing this visual problem?"     trelegy 9. OTHER SYMPTOMS: "Do you have any other symptoms?" (e.g., confusion, headache, arm or leg weakness, speech problems)     denies  Protocols used: Information Only Call - No Triage-A-AH, Vision Loss or Change-A-AH

## 2023-07-31 NOTE — Telephone Encounter (Signed)
 Patient is scheduled to see Dr. Aundria Rud tomorrow to go over her CT results.  Nothing further needed.

## 2023-08-01 ENCOUNTER — Ambulatory Visit: Admitting: Student in an Organized Health Care Education/Training Program

## 2023-08-01 ENCOUNTER — Encounter: Payer: Self-pay | Admitting: Student in an Organized Health Care Education/Training Program

## 2023-08-01 VITALS — BP 130/76 | HR 70 | Temp 97.6°F | Ht 61.0 in

## 2023-08-01 DIAGNOSIS — M0579 Rheumatoid arthritis with rheumatoid factor of multiple sites without organ or systems involvement: Secondary | ICD-10-CM

## 2023-08-01 DIAGNOSIS — R053 Chronic cough: Secondary | ICD-10-CM

## 2023-08-01 DIAGNOSIS — R918 Other nonspecific abnormal finding of lung field: Secondary | ICD-10-CM

## 2023-08-01 DIAGNOSIS — J4540 Moderate persistent asthma, uncomplicated: Secondary | ICD-10-CM | POA: Diagnosis not present

## 2023-08-01 NOTE — Progress Notes (Signed)
 Assessment & Plan:   #Pulmonary mass  Presenting with both right upper lobe and right lower lobe masses with associated lymphadenopathy and is status post 2 robotic assisted navigational bronchoscopies to the right lower lobe and right upper lobes with cytology returning benign.  EBUS was similarly benign.  These findings most likely represent benign mass/nodule in the setting of immunosuppression as well as rheumatoid arthritis.  She has stopped her methotrexate and these masses have been unchanged on 1 year interval repeat CT.  The CT does show a nodule of the left upper lobe with associated tree-in-bud clusters which given her illness at that time most likely represent an infectious finding.  I will repeat a CT scan in 6 months to follow-up on said nodule.  - CT CHEST WO CONTRAST; Future - Repeat ESR at next visit  #Moderate persistent asthma without complication #Chronic Cough   Patient has a history of obstruction on PFT's from 12/13/2021 with scooping of the expiratory flow volume loop. While her ratio is preserved (73%), she does have an impaired FEV1 of 1.19 at 68% predicted. 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 She also has a strong family history of asthma which is likely a very strong contributor to her symptoms. FVC is decreased and so is TLC, suggesting mild restriction. DLCO was within normal. CT high resolution on 11/23/2021 did not show any signs of ILD. 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    She is maintained on high-dose Trelegy (200-60 2.5-25) and albuterol.  She is holding her Trelegy secondary to ocular symptoms but I am hopeful that she will be able to resume it soon.  I have also started tezepelumab but this has not yet been initiated secondary to financial difficulty.  We await for patient assistance program to kick in.  Today, her symptoms are much improved and she has clear lungs with resolved symptoms.   Will review symptoms on follow-up.  - Fluticasone-Umeclidin-Vilant (TRELEGY ELLIPTA) 200-62.5-25 MCG/ACT AEPB; Inhale 1 puff into the lungs daily.  Dispense: 30 each; Refill: 11 -initiate tezepelumab -continue levocetirizine  I spent 30 minutes caring for this patient today, including preparing to see the patient, obtaining a medical history , reviewing a separately obtained history, performing a medically appropriate examination and/or evaluation, counseling and educating the patient/family/caregiver, ordering medications, tests, or procedures, documenting clinical information in the electronic health record, and independently interpreting results (not separately reported/billed) and communicating results to the patient/family/caregiver  Raechel Chute, MD [facility] 08/01/2023 9:31 AM    End of visit medications:  No orders of the defined types were placed in this encounter.    Current Outpatient Medications:    albuterol (VENTOLIN HFA) 108 (90 Base) MCG/ACT inhaler, Inhale 2 puffs into the lungs every 6 (six) hours as needed for wheezing or shortness of breath., Disp: 8 g, Rfl: 0   Ascorbic Acid (VITAMIN C PO), Take 2,000 mg by mouth in the morning., Disp: , Rfl:    aspirin 81 MG EC tablet, Take 81 mg by mouth in the morning., Disp: , Rfl:    Calcium Carb-Cholecalciferol (CALTRATE 600+D3 PO), Take 1 tablet by mouth in the morning., Disp: , Rfl:    Cholecalciferol (VITAMIN D-3) 125 MCG (5000 UT) TABS, Take 5,000 Units by mouth in the morning., Disp: , Rfl:    Cyanocobalamin (VITAMIN B-12) 2500 MCG SUBL, Take 2,500 mcg by mouth in the morning., Disp: , Rfl:    fluticasone (  FLONASE) 50 MCG/ACT nasal spray, Place 1 spray into both nostrils daily., Disp: 18.2 mL, Rfl: 6   hydroxychloroquine (PLAQUENIL) 200 MG tablet, Take 200 mg by mouth 2 (two) times daily., Disp: , Rfl:    ipratropium (ATROVENT HFA) 17 MCG/ACT inhaler, Inhale 2 puffs into the lungs every 4 (four) hours  as needed for wheezing (SOB)., Disp: 1 each, Rfl: 0   levocetirizine (XYZAL) 5 MG tablet, Take 1 tablet (5 mg total) by mouth every evening., Disp: 30 tablet, Rfl: 11   naproxen sodium (ALEVE) 220 MG tablet, Take 440 mg by mouth daily as needed (pain.)., Disp: , Rfl:    Omega-3 Fatty Acids (FISH OIL PO), Take 1 capsule by mouth in the morning., Disp: , Rfl:    Potassium 99 MG TABS, Take 99 mg by mouth in the morning., Disp: , Rfl:    valACYclovir (VALTREX) 1000 MG tablet, Take 1,000-2,000 mg by mouth 2 (two) times daily as needed (cold sore/fever blister)., Disp: , Rfl:    zinc sulfate 220 (50 Zn) MG capsule, Take 220 mg by mouth in the morning., Disp: , Rfl:    Fluticasone-Umeclidin-Vilant (TRELEGY ELLIPTA) 200-62.5-25 MCG/ACT AEPB, Inhale 1 puff into the lungs daily. (Patient not taking: Reported on 08/01/2023), Disp: 30 each, Rfl: 11   Subjective:   PATIENT ID: Kim Bentley GENDER: female DOB: [birth year], MRN: [id number]  Chief Complaint  Patient presents with   Follow-up    No cough, shortness of breath or wheezing. Using albuterol in the nebulizer two times a day.     HPI  Kim Bentley is a pleasant 83 year old female presenting for follow up. She is here to discuss the results from her recent chest CT.  Her breathing has been well and she is not had any worsening since our last visit.  During her last visit in February 2025, she had felt short of breath with increased cough and wheeze for a couple weeks prior to presentation.  She had received a course of azithromycin as well as prednisone with improvement.  She has had her CT scan of the chest the week prior to that visit while she was having symptoms.  Official report from the CT scan is now back noting stable nodules in the right upper and right lower lobes but with a new nodule in the left upper lobe.  She was seen by her rheumatologist who recommended that she follow-up with pulmonary to further discuss this.  She was also noted to  have an elevated ESR.  She has not yet been able to get her tezepelumab given cost and we are awaiting patient assistance program.  She has been on Trelegy and was compliant with it.  She started experiencing increased rhinorrhea and runny eyes and was asked by her ophthalmologist to hold the Trelegy while she gets an evaluation.  She is currently using her albuterol inhaler and nebulizers.  She denies having any wheezing, shortness of breath, cough, or chest tightness.  She is actually feeling significantly better and was able to mow her lawn and do plenty of yard work.  She was not bothered much by pollen season this year.   Patient underwent repeat robotic assisted navigational bronchoscopy with biopsy of masses in her RUL and RLL in January of 2024. The pathology result has returned benign, and culture has grown H. Influenza, now s/p a course of antibiotics. She was incidentally found to have a pulmonary mass after presenting with shortness of breath following a fall.  She then underwent a PET/CT showing low FDG avidity in both masses. The CT scan was also notable for lymphadenopathy (4R and 7 on my review). She was referred to oncology and saw Dr. Donneta Romberg and was subsequently presented in tumor board and was subsequently referred for biopsy.   She underwent robotic assisted navigational bronchoscopy on 01/21/2022 to the RLL mass, in addition EBUS to multiple stations. The results from the biopsy has returned benign, with cultures growing Ralstonia Picketti. Similarly, result from the EBUS has been negative for malignancy. Repeat Navigational Bronchoscopy on 05/06/2022 showed benign cytology and cultures with H. Influenza. EBUS was not repeated during said procedure.   In regards to her RA, she has followed with rheumatology. Patient is off methotrexate and continues on hydrxychloroquine. She is a non-smoker.  Ancillary information including prior medications, full medical/surgical/family/social  histories, and PFTs (when available) are listed below and have been reviewed.   Review of Systems  Constitutional:  Negative for chills and fever.  Respiratory:  Negative for cough, shortness of breath and wheezing.   Cardiovascular:  Negative for chest pain.  Skin:  Negative for rash.     Objective:   Vitals:   08/01/23 0857  BP: 130/76  Pulse: 70  Temp: 97.6 F (36.4 C)  TempSrc: Temporal  SpO2: 99%  Height: 5\' 1"  (1.549 m)   99% on RA BMI Readings from Last 3 Encounters:  08/01/23 33.37 kg/m  06/20/23 33.37 kg/m  06/06/23 33.78 kg/m   Wt Readings from Last 3 Encounters:  06/20/23 176 lb 9.6 oz (80.1 kg)  06/06/23 178 lb 12.8 oz (81.1 kg)  04/14/23 175 lb 9.6 oz (79.7 kg)    Physical Exam Vitals reviewed.  Constitutional:      Appearance: Normal appearance. She is obese.  HENT:     Nose: Nose normal.     Mouth/Throat:     Mouth: Mucous membranes are moist.  Eyes:     Pupils: Pupils are equal, round, and reactive to light.  Cardiovascular:     Rate and Rhythm: Normal rate and regular rhythm.     Pulses: Normal pulses.     Heart sounds: Normal heart sounds.  Pulmonary:     Effort: Pulmonary effort is normal.     Breath sounds: Normal breath sounds. No wheezing or rhonchi.  Abdominal:     General: There is distension.     Palpations: Abdomen is soft.  Musculoskeletal:     Cervical back: Normal range of motion.  Skin:    General: Skin is warm.  Neurological:     General: No focal deficit present.     Mental Status: She is alert and oriented to person, place, and time. Mental status is at baseline.       Ancillary Information    Past Medical History:  Diagnosis Date   Allergic genetic state    Arthritis    Bladder spasms    COPD (chronic obstructive pulmonary disease) (HCC)    mild   COVID 2021   very mild   Dyspnea    GERD (gastroesophageal reflux disease)    History of kidney stones    HLD (hyperlipidemia)    Pneumonia    Pulmonary  mass      Family History  Problem Relation Age of Onset   Diabetes Mother    Obesity Mother    Breast cancer Sister 52   Healthy Brother    Brain cancer Brother      Past Surgical History:  Procedure Laterality Date   BRONCHIAL BIOPSY  05/06/2022   Procedure: BRONCHIAL BIOPSIES;  Surgeon: Raechel Chute, MD;  Location: [facility] ENDOSCOPY;  Service: Pulmonary;;   BRONCHIAL NEEDLE ASPIRATION BIOPSY  05/06/2022   Procedure: BRONCHIAL NEEDLE ASPIRATION BIOPSIES;  Surgeon: Raechel Chute, MD;  Location: [facility];  Service: Pulmonary;;   COLONOSCOPY     COLONOSCOPY N/A 09/27/2021   Procedure: COLONOSCOPY;  Surgeon: Regis Bill, MD;  Location: [facility] ENDOSCOPY;  Service: Endoscopy;  Laterality: N/A;   COLONOSCOPY WITH PROPOFOL N/A 03/26/2018   Procedure: COLONOSCOPY WITH PROPOFOL;  Surgeon: Christena Deem, MD;  Location: [facility] ENDOSCOPY;  Service: Endoscopy;  Laterality: N/A;   ESOPHAGOGASTRODUODENOSCOPY     HERNIA REPAIR     TONSILLECTOMY     VIDEO BRONCHOSCOPY WITH RADIAL ENDOBRONCHIAL ULTRASOUND  05/06/2022   Procedure: VIDEO BRONCHOSCOPY WITH RADIAL ENDOBRONCHIAL ULTRASOUND;  Surgeon: Raechel Chute, MD;  Location: [facility] ENDOSCOPY;  Service: Pulmonary;;    Social History   Socioeconomic History   Marital status: Widowed    Spouse name: Not on file   Number of children: Not on file   Years of education: Not on file   Highest education level: Not on file  Occupational History   Not on file  Tobacco Use   Smoking status: Never    Passive exposure: Past   Smokeless tobacco: Never  Vaping Use   Vaping status: Never Used  Substance and Sexual Activity   Alcohol use: Never   Drug use: Never   Sexual activity: Not on file  Other Topics Concern   Not on file  Social History Narrative   Lives alone, grandchild lives with her   Social Drivers of Health   Financial Resource Strain: Patient Declined (03/28/2022)   Received from [facility], [facility]   Overall Financial Resource Strain (CARDIA)    Difficulty of Paying Living Expenses: Patient declined  Food Insecurity: Patient Declined (03/28/2022)   Received from [facility], [facility]   Hunger Vital Sign    Worried About Running Out of Food in the Last Year: Patient declined    Ran Out of Food in the Last Year: Patient declined  Transportation Needs: Patient Declined (03/28/2022)   Received from [facility], [facility]   PRAPARE - Transportation    In the past 12 months, has lack of transportation kept you from medical appointments or from getting medications?: Patient declined    Lack of Transportation (Non-Medical): Patient declined  Physical Activity: Not on file  Stress: Not on file  Social Connections: Not on file  Intimate Partner Violence: Not on file     No Known Allergies   CBC    Component Value Date/Time   WBC 9.8 05/06/2022 0601   RBC 4.12 05/06/2022 0601   HGB 12.8 05/06/2022 0601   HGB 12.4 10/27/2011 0217   HCT 39.0 05/06/2022 0601   HCT 36.8 10/27/2011 0217   PLT 222 05/06/2022 0601   PLT 182 10/27/2011 0217   MCV 94.7 05/06/2022 0601   MCV 91 10/27/2011 0217   MCH 31.1 05/06/2022 0601   MCHC 32.8 05/06/2022 0601   RDW 13.9 05/06/2022 0601   RDW 12.9 10/27/2011 0217   LYMPHSABS 1.9 01/12/2022 1235   LYMPHSABS 2.3 10/27/2011 0217   MONOABS 0.5 01/12/2022 1235   MONOABS 0.7 10/27/2011 0217   EOSABS 0.2 01/12/2022 1235   EOSABS 0.2 10/27/2011 0217   BASOSABS 0.1 01/12/2022 1235  BASOSABS 0.0 10/27/2011 0217    Pulmonary Functions Testing Results:     No data to display          Outpatient Medications Prior to Visit  Medication Sig Dispense Refill   albuterol (VENTOLIN HFA) 108 (90 Base) MCG/ACT inhaler Inhale 2 puffs into the lungs every 6 (six) hours as needed for wheezing or shortness of breath. 8 g 0   Ascorbic Acid (VITAMIN C PO) Take 2,000 mg by  mouth in the morning.     aspirin 81 MG EC tablet Take 81 mg by mouth in the morning.     Calcium Carb-Cholecalciferol (CALTRATE 600+D3 PO) Take 1 tablet by mouth in the morning.     Cholecalciferol (VITAMIN D-3) 125 MCG (5000 UT) TABS Take 5,000 Units by mouth in the morning.     Cyanocobalamin (VITAMIN B-12) 2500 MCG SUBL Take 2,500 mcg by mouth in the morning.     fluticasone (FLONASE) 50 MCG/ACT nasal spray Place 1 spray into both nostrils daily. 18.2 mL 6   hydroxychloroquine (PLAQUENIL) 200 MG tablet Take 200 mg by mouth 2 (two) times daily.     ipratropium (ATROVENT HFA) 17 MCG/ACT inhaler Inhale 2 puffs into the lungs every 4 (four) hours as needed for wheezing (SOB). 1 each 0   levocetirizine (XYZAL) 5 MG tablet Take 1 tablet (5 mg total) by mouth every evening. 30 tablet 11   naproxen sodium (ALEVE) 220 MG tablet Take 440 mg by mouth daily as needed (pain.).     Omega-3 Fatty Acids (FISH OIL PO) Take 1 capsule by mouth in the morning.     Potassium 99 MG TABS Take 99 mg by mouth in the morning.     valACYclovir (VALTREX) 1000 MG tablet Take 1,000-2,000 mg by mouth 2 (two) times daily as needed (cold sore/fever blister).     zinc sulfate 220 (50 Zn) MG capsule Take 220 mg by mouth in the morning.     Fluticasone-Umeclidin-Vilant (TRELEGY ELLIPTA) 200-62.5-25 MCG/ACT AEPB Inhale 1 puff into the lungs daily. (Patient not taking: Reported on 08/01/2023) 30 each 11   acetaminophen (TYLENOL) 500 MG tablet Take 500-1,000 mg by mouth every 8 (eight) hours as needed (pain.). (Patient not taking: Reported on 06/20/2023)     No facility-administered medications prior to visit.

## 2023-08-14 ENCOUNTER — Telehealth: Payer: Self-pay

## 2023-08-14 NOTE — Telephone Encounter (Signed)
 Missing consent forms faxed to [employer] at [phone]. Will await f/u.

## 2023-08-14 NOTE — Telephone Encounter (Signed)
 Copied from CRM [id number]. 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 Please advise and call back.

## 2023-08-15 NOTE — Telephone Encounter (Signed)
 Kim Bentley w/ [facility] calling to follow up on this Rx request . Case number  [id number] Valid dates 06/20/2023-04/24/2024  This is going through her medicare Part D  The pt needs a current Rx on file.  Please fax ASAP to  Fax [phone]  (or e-scribe)  Hub details [address]    [place], [place]   [address]  Telephone   Phone: [phone]   Kim Bentley has asked if we could please expedite this Rx request.  You can fax or e-scribe. [facility] is going to work w/ [employer] to get the approval for the cost of the med for the pt. They just need a current Rx please.

## 2023-08-21 NOTE — Telephone Encounter (Signed)
   Refaxed everything in it's entirety back to [employer]. Will await further response.

## 2023-08-22 NOTE — Telephone Encounter (Signed)
 Please send Rx for Kim Bentley to specialty pharmacy.

## 2023-08-23 NOTE — Telephone Encounter (Signed)
 Noted. Nothing further needed.

## 2023-08-23 NOTE — Telephone Encounter (Signed)
 Patient is undergoing screening for Tezspire patient assistance program at this time. It will not be filled by [facility]

## 2023-09-01 MED ORDER — TEZSPIRE 210 MG/1.91ML ~~LOC~~ SOAJ
210.0000 mg | SUBCUTANEOUS | 0 refills | Status: DC
  Filled 2023-09-04 (×2): qty 1.91, 28d supply, fill #0

## 2023-09-01 NOTE — Telephone Encounter (Signed)
 Patient enrolled in asthma grant through [employer]:  ID: [id number] Eligibility start date: 06/02/2023 Group ID: [id number] Eligibility end date: 08/29/2024 RxBin ID: [id number] Assistance amount: $1500 PCN: [id number]  Patient can be scheduled for Tezspire new start

## 2023-09-04 ENCOUNTER — Other Ambulatory Visit: Payer: Self-pay

## 2023-09-04 ENCOUNTER — Other Ambulatory Visit (HOSPITAL_COMMUNITY): Payer: Self-pay

## 2023-09-04 NOTE — Telephone Encounter (Signed)
 Spoke with patient to inform her of [employer] grant approval. She was pleased with this news. She was scheduled for Tezspire new start appointment and given clinic address.

## 2023-09-04 NOTE — Progress Notes (Signed)
 Specialty Pharmacy Initial Fill Coordination Note  JOLYNN CLOUATRE is a 83 y.o. female contacted today regarding initial fill of specialty medication(s) Tezepelumab-ekko Rosalind [place])   Patient requested Courier to Provider Office   Delivery date: 09/06/23   Verified address: [address] [place], [place] [address]   Medication will be filled on 09/05/23.   Patient is enrolled into grant and is aware of $0 copayment.

## 2023-09-05 ENCOUNTER — Other Ambulatory Visit: Payer: Self-pay

## 2023-09-05 NOTE — Telephone Encounter (Signed)
 Received a fax from  [employer] regarding an approval for TEZSPIRE patient assistance from 09/05/2023 to 04/24/2024. Approval letter sent to scan center.  Phone: [phone] Fax: [phone]   Will utilize PAP if grant depletes and leaves patient with additional copays during calendar ear  Geraldene Kleine, PharmD, MPH, BCPS, CPP Clinical Pharmacist (Rheumatology and Pulmonology)

## 2023-09-06 NOTE — Patient Instructions (Addendum)
 Your next Tezspire  dose is due on 10/09/2023, 11/06/2023, and every 28 days thereafter  CONTINUE Trelegy and Singulair  Your prescription will be shipped from [facility]. Their phone number is [phone] Please call to schedule shipment and confirm address. They will mail your medication to your home.  Your copay should be affordable. If you call the pharmacy and it is not affordable, please double-check that they are billing through your grant as secondary coverage. That grant information is:  ID: [id number] Group ID: [id number] RxBin ID: [id number] PCN: [id number]    You will need to be seen by your provider in 3 to 4 months to assess how Tezspire  is working for you. Please ensure you have a follow-up appointment scheduled. Call our clinic if you need to make this appointment.  Stay up to date on all routine vaccines: influenza, pneumonia, COVID19, Shingles  How to manage an injection site reaction: Remember the 5 C's: COUNTER - leave on the counter at least 30 minutes but up to overnight to bring medication to room temperature. This may help prevent stinging COLD - place something cold (like an ice gel pack or cold water bottle) on the injection site just before cleansing with alcohol. This may help reduce pain CLARITIN - use Claritin (generic name is loratadine) for the first two weeks of treatment or the day of, the day before, and the day after injecting. This will help to minimize injection site reactions CORTISONE CREAM - apply if injection site is irritated and itching CALL ME - if injection site reaction is bigger than the size of your fist, looks infected, blisters, or if you develop hives

## 2023-09-06 NOTE — Progress Notes (Signed)
 HPI Patient presents today to [facility] to see pharmacy team for Tezspire  new start.  Past medical history includes asthma/COPD, ILD, RA, and lymphadenopathy.   Patient reported she is not taking Trelegy. The medication made her throat uncomfortable. She was rinsing her mouth out with each dose as instructed. She is willing to re-trial Symbicort .   In the interim she is maintaining on albuterol  nebulizer as needed. She last dose was taken this morning.   Respiratory Medications Current regimen: albuterol  nebulizer, Singulair, and Xyzal   OBJECTIVE No Known Allergies  Outpatient Encounter Medications as of 09/11/2023  Medication Sig   albuterol  (VENTOLIN  HFA) 108 (90 Base) MCG/ACT inhaler Inhale 2 puffs into the lungs every 6 (six) hours as needed for wheezing or shortness of breath.   Ascorbic Acid (VITAMIN C PO) Take 2,000 mg by mouth in the morning.   aspirin 81 MG EC tablet Take 81 mg by mouth in the morning.   Calcium Carb-Cholecalciferol (CALTRATE 600+D3 PO) Take 1 tablet by mouth in the morning.   Cholecalciferol (VITAMIN D-3) 125 MCG (5000 UT) TABS Take 5,000 Units by mouth in the morning.   Cyanocobalamin (VITAMIN B-12) 2500 MCG SUBL Take 2,500 mcg by mouth in the morning.   fluticasone  (FLONASE ) 50 MCG/ACT nasal spray Place 1 spray into both nostrils daily.   Fluticasone -Umeclidin-Vilant (TRELEGY ELLIPTA ) 200-62.5-25 MCG/ACT AEPB Inhale 1 puff into the lungs daily. (Patient not taking: Reported on 08/01/2023)   hydroxychloroquine (PLAQUENIL) 200 MG tablet Take 200 mg by mouth 2 (two) times daily.   ipratropium (ATROVENT  HFA) 17 MCG/ACT inhaler Inhale 2 puffs into the lungs every 4 (four) hours as needed for wheezing (SOB).   levocetirizine (XYZAL ) 5 MG tablet Take 1 tablet (5 mg total) by mouth every evening.   naproxen sodium (ALEVE) 220 MG tablet Take 440 mg by mouth daily as needed (pain.).   Omega-3 Fatty Acids (FISH OIL PO) Take 1 capsule by mouth in the morning.    Potassium 99 MG TABS Take 99 mg by mouth in the morning.   Tezepelumab -ekko (TEZSPIRE ) 210 MG/1. SOAJ Inject 210 mg into the skin every 28 (twenty-eight) days. Courier to pulm: [address], [address], [place] [address]. Appt on 09/07/23   valACYclovir (VALTREX) 1000 MG tablet Take 1,000-2,000 mg by mouth 2 (two) times daily as needed (cold sore/fever blister).   zinc sulfate 220 (50 Zn) MG capsule Take 220 mg by mouth in the morning.   No facility-administered encounter medications on file as of 09/11/2023.     Immunization History  Administered Date(s) Administered   Fluad Quad(high Dose 65+) 01/12/2022   Fluad Trivalent(High Dose 65+) 01/05/2023   Influenza Inj Mdck Quad Pf 02/22/2016   Influenza, High Dose Seasonal PF 02/25/2018   Influenza-Unspecified 02/17/2021   PFIZER Comirnaty(Gray Top)Covid-19 Tri-Sucrose Vaccine 06/21/2019, 11/29/2019, 06/04/2020   Pneumococcal Conjugate-13 01/23/2015   Pneumococcal Polysaccharide-23 02/22/2016   Pneumococcal-Unspecified 03/29/2007   Tdap 11/27/2017   Zoster, Live 10/06/2011     PFTs     No data to display           Eosinophils Most recent blood eosinophil count was 0.2 kcells/microL taken on 01/12/2022.   IgE: Never collected   Assessment   Biologics training for tezepulumab (Tezspire )  Goals of therapy: Mechanism: human monoclonal IgG2? antibody that binds to TSLP. This blocks TSLP from its effect on inflammation including reduce eosinophils, IgE, FeNO, IL-5, and IL-13. Mechanism is not definitively established. Reviewed that Tezspire  is add-on medication  and patient must continue maintenance inhaler regimen. Response to therapy: may take 3-4 months to determine efficacy.  Side effects: injection site reaction (6-18%), antibody development (2%), arthralgia (4%), back pain (4%), pharyngitis (4%)  Dose: Tezspire  210 mg once every 4 weeks  Administration/Storage:  Reviewed administration sites of thigh or abdomen  (at least 2-3 inches away from abdomen). Reviewed the upper arm is only appropriate if caregiver is administering injection  Do not shake pen/syringe as this could lead to product foaming or precipitation. Do not shake syringe as this could lead to product foaming or precipitation.  Access: Approval of Tezspire  through: insurance and grant Patient enrolled into grant program  Patient self-administered Tezspire  210mg /1.91 ml in left lower abdomen using sample Tezspire  210mg /1.91 ml Autoinjector pen NDC: 16109-604-54 Lot: [id number] Expiration: 07/23/2024  Patient monitored for 30 minutes for adverse reaction.  Patient tolerated Tezspire . Injection site noted. Patient denies itchiness and irritation at injection., No swelling or redness noted., and Reviewed injection site reaction management with patient verbally and printed information for review in AVS  Medication Reconciliation  A drug regimen assessment was performed, including review of allergies, interactions, disease-state management, dosing and immunization history. Medications were reviewed with the patient, including name, instructions, indication, goals of therapy, potential side effects, importance of adherence, and safe use.  Drug interaction(s): No significant DDIs identified   PLAN Continue Tezspire  210mg  SQ every 28 days.  Rx sent to: [facility]: [phone] .   Continue maintenance asthma regimen of: Symbicort , albuterol , and Singualir  All questions encouraged and answered.  Instructed patient to reach out with any further questions or concerns.  Thank you for allowing pharmacy to participate in this patient's care.   Tolu Kary Sugrue, PharmD [facility] PGY-1

## 2023-09-07 ENCOUNTER — Ambulatory Visit: Payer: Medicare HMO | Admitting: Student in an Organized Health Care Education/Training Program

## 2023-09-08 ENCOUNTER — Other Ambulatory Visit: Payer: Self-pay | Admitting: Family Medicine

## 2023-09-08 DIAGNOSIS — Z1231 Encounter for screening mammogram for malignant neoplasm of breast: Secondary | ICD-10-CM

## 2023-09-11 ENCOUNTER — Other Ambulatory Visit (HOSPITAL_COMMUNITY): Payer: Self-pay

## 2023-09-11 ENCOUNTER — Ambulatory Visit: Admitting: Pharmacist

## 2023-09-11 ENCOUNTER — Other Ambulatory Visit: Payer: Self-pay

## 2023-09-11 DIAGNOSIS — R053 Chronic cough: Secondary | ICD-10-CM | POA: Diagnosis not present

## 2023-09-11 DIAGNOSIS — J4540 Moderate persistent asthma, uncomplicated: Secondary | ICD-10-CM

## 2023-09-11 DIAGNOSIS — Z7722 Contact with and (suspected) exposure to environmental tobacco smoke (acute) (chronic): Secondary | ICD-10-CM

## 2023-09-11 MED ORDER — BUDESONIDE-FORMOTEROL FUMARATE 160-4.5 MCG/ACT IN AERO
2.0000 | INHALATION_SPRAY | Freq: Two times a day (BID) | RESPIRATORY_TRACT | 12 refills | Status: DC
  Filled 2023-09-11 (×3): qty 10.2, 30d supply, fill #0

## 2023-09-11 MED ORDER — TEZSPIRE 210 MG/1.91ML ~~LOC~~ SOAJ
210.0000 mg | SUBCUTANEOUS | 4 refills | Status: DC
  Filled 2023-09-11 – 2023-10-03 (×2): qty 1.91, 28d supply, fill #0

## 2023-09-11 NOTE — Addendum Note (Signed)
 Addended by: Burna Carrier on: 09/11/2023 02:12 PM   Modules accepted: Orders

## 2023-09-12 NOTE — Progress Notes (Signed)
 Patient completed first Tezspire  dose in clinic on 09/11/23. Tolerated wel. She states that her daughter will help her administer medication at home.  Patient advised to restart maintenance inhaler. Rx for Symbicort  sent to pharmacy  Geraldene Kleine, PharmD, MPH, BCPS, CPP Clinical Pharmacist (Rheumatology and Pulmonology)

## 2023-09-20 ENCOUNTER — Other Ambulatory Visit: Payer: Self-pay

## 2023-09-25 ENCOUNTER — Other Ambulatory Visit: Payer: Self-pay

## 2023-09-29 ENCOUNTER — Ambulatory Visit: Payer: Medicare Other | Admitting: Student in an Organized Health Care Education/Training Program

## 2023-09-29 ENCOUNTER — Other Ambulatory Visit: Payer: Self-pay

## 2023-10-03 ENCOUNTER — Other Ambulatory Visit: Payer: Self-pay

## 2023-10-05 ENCOUNTER — Ambulatory Visit: Admitting: Student in an Organized Health Care Education/Training Program

## 2023-10-05 ENCOUNTER — Encounter: Payer: Self-pay | Admitting: Student in an Organized Health Care Education/Training Program

## 2023-10-05 VITALS — BP 128/74 | HR 81 | Temp 98.5°F | Ht 60.0 in | Wt 175.2 lb

## 2023-10-05 DIAGNOSIS — R918 Other nonspecific abnormal finding of lung field: Secondary | ICD-10-CM

## 2023-10-05 DIAGNOSIS — R053 Chronic cough: Secondary | ICD-10-CM

## 2023-10-05 DIAGNOSIS — J4540 Moderate persistent asthma, uncomplicated: Secondary | ICD-10-CM | POA: Diagnosis not present

## 2023-10-05 MED ORDER — BUDESONIDE-FORMOTEROL FUMARATE 160-4.5 MCG/ACT IN AERO
2.0000 | INHALATION_SPRAY | Freq: Two times a day (BID) | RESPIRATORY_TRACT | 12 refills | Status: DC
Start: 2023-10-05 — End: 2024-02-15

## 2023-10-05 NOTE — Progress Notes (Signed)
 Assessment & Plan:   #Moderate Persistent Asthma  Patient has a history of obstruction on PFT's from 12/13/2021 with scooping of the expiratory flow volume loop. While her ratio is preserved (73%), she does have an impaired FEV1 of 1.19 at 68% predicted. 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 FVC is decreased and so is TLC, suggesting mild restriction. DLCO was within normal. CT high resolution on 11/23/2021 did not show any signs of ILD. 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   She was maintained on high-dose Trelegy (200-60 2.5-25) which is now discontinued and switched to Symbicort . We did initiate Tezepelumab  which has provided excellent control of her symptoms. and albuterol .   -continue tezepelumab  -d/c atrovent  -switched trelegy to Symbicort  -continue symbicort  PRN -continue levocetirizine  #Right lower lobe lung mass  Presenting with both right upper lobe and right lower lobe masses with associated lymphadenopathy and is status post 2 robotic assisted navigational bronchoscopies to the right lower lobe and right upper lobes with cytology returning benign. EBUS was similarly benign. These findings most likely represent benign mass/nodule in the setting of immunosuppression as well as rheumatoid arthritis. She has stopped her methotrexate and these masses have been unchanged on 1 year interval repeat CT. The CT does show a nodule of the left upper lobe with associated tree-in-bud clusters which given her illness at that time most likely represent an infectious finding. I will repeat a CT scan at the 6 month mark to follow up on said nodule.  - CT chest in September  Return in about 3 months (around 01/05/2024).  I spent 30 minutes caring for this patient today, including preparing to see the patient, obtaining a medical history ,  reviewing a separately obtained history, performing a medically appropriate examination and/or evaluation, counseling and educating the patient/family/caregiver, ordering medications, tests, or procedures, documenting clinical information in the electronic health record, and independently interpreting results (not separately reported/billed) and communicating results to the patient/family/caregiver  Vergia Glasgow, MD [facility]  End of visit medications:  No orders of the defined types were placed in this encounter.    Current Outpatient Medications:    albuterol  (VENTOLIN  HFA) 108 (90 Base) MCG/ACT inhaler, Inhale 2 puffs into the lungs every 6 (six) hours as needed for wheezing or shortness of breath., Disp: 8 g, Rfl: 0   Ascorbic Acid (VITAMIN C PO), Take 2,000 mg by mouth in the morning., Disp: , Rfl:    aspirin 81 MG EC tablet, Take 81 mg by mouth in the morning., Disp: , Rfl:    budesonide -formoterol  (SYMBICORT ) 160-4.5 MCG/ACT inhaler, Inhale 2 puffs into the lungs 2 (two) times daily., Disp: 10.2 g, Rfl: 12   Calcium Carb-Cholecalciferol (CALTRATE 600+D3 PO), Take 1 tablet by mouth in the morning., Disp: , Rfl:    Cholecalciferol (VITAMIN D-3) 125 MCG (5000 UT) TABS, Take 5,000 Units by mouth in the morning., Disp: , Rfl:    Cyanocobalamin (VITAMIN B-12) 2500 MCG SUBL, Take 2,500 mcg by mouth in the morning., Disp: , Rfl:    fluticasone  (FLONASE ) 50 MCG/ACT nasal spray, Place 1 spray into both nostrils daily., Disp: 18.2 mL, Rfl: 6   hydroxychloroquine (PLAQUENIL) 200 MG tablet, Take 200 mg by mouth 2 (two) times daily., Disp: , Rfl:    ipratropium (ATROVENT  HFA) 17 MCG/ACT inhaler, Inhale 2 puffs into the lungs  every 4 (four) hours as needed for wheezing (SOB)., Disp: 1 each, Rfl: 0   levocetirizine (XYZAL ) 5 MG tablet, Take 1 tablet (5 mg total) by mouth every evening., Disp: 30 tablet, Rfl: 11   naproxen sodium (ALEVE) 220 MG tablet, Take 440 mg by mouth daily  as needed (pain.)., Disp: , Rfl:    Omega-3 Fatty Acids (FISH OIL PO), Take 1 capsule by mouth in the morning., Disp: , Rfl:    Potassium 99 MG TABS, Take 99 mg by mouth in the morning., Disp: , Rfl:    Tezepelumab -ekko (TEZSPIRE ) 210 MG/1. SOAJ, Inject 210 mg into the skin every 28 (twenty-eight) days., Disp: 1.91 mL, Rfl: 4   valACYclovir (VALTREX) 1000 MG tablet, Take 1,000-2,000 mg by mouth 2 (two) times daily as needed (cold sore/fever blister)., Disp: , Rfl:    zinc sulfate 220 (50 Zn) MG capsule, Take 220 mg by mouth in the morning., Disp: , Rfl:    Subjective:   PATIENT ID: Kim Bentley GENDER: female DOB: 1941-03-18, MRN: [id number]  Chief Complaint  Patient presents with   Follow-up    Doing well.  Does get SOB if talking a lot.  No cough or wheeze noted.  Using albuterol  nebs prn. Please clarify rescue inhaler if albuterol  HFA or atrovent  HFA    HPI  Kim Bentley is a pleasant 83 year old female presenting for follow up.  We initiated Tezepelumab  which she has tolerated well, and feels that her symptoms are much better with it. She did stop her Trelegy and switched to Symbicort  secondary to ocular symptoms. She is currently reporting minimal cough, and has no wheeze. She's reporting some exertional dyspnea. She otherwise has no complaints. She has not had any exacerbations since our last visit. We are also following her pulmonary nodules with serial CT's that show them to be stable. She does report some weight gain.  She underwent robotic assisted navigational bronchoscopy on 01/21/2022 to the RLL mass, in addition EBUS to multiple stations. The results from the biopsy has returned benign, with cultures growing Ralstonia Picketti. Similarly, result from the EBUS has been negative for malignancy. Repeat Navigational Bronchoscopy on 05/06/2022 showed benign cytology and cultures with H. Influenza. EBUS was not repeated during said procedure.   In regards to her RA, she has followed  with rheumatology. Patient is off methotrexate and continues on hydrxychloroquine. She is a non-smoker.  Ancillary information including prior medications, full medical/surgical/family/social histories, and PFTs (when available) are listed below and have been reviewed.   Review of Systems  Constitutional:  Negative for chills and fever.  Respiratory:  Negative for cough, shortness of breath and wheezing.   Cardiovascular:  Negative for chest pain.  Skin:  Negative for rash.     Objective:   Vitals:   10/05/23 1130  BP: 128/74  Pulse: 81  Temp: 98.5 F (36.9 C)  TempSrc: Oral  SpO2: 95%  Weight: 175 lb 3.2 oz (79.5 kg)  Height: 5' (1.524 m)   95% on RA BMI Readings from Last 3 Encounters:  10/05/23 34.22 kg/m  08/01/23 33.37 kg/m  06/20/23 33.37 kg/m   Wt Readings from Last 3 Encounters:  10/05/23 175 lb 3.2 oz (79.5 kg)  06/20/23 176 lb 9.6 oz (80.1 kg)  06/06/23 178 lb 12.8 oz (81.1 kg)    Physical Exam Vitals reviewed.  Constitutional:      Appearance: Normal appearance. She is obese.  HENT:     Nose: Nose normal.  Mouth/Throat:     Mouth: Mucous membranes are moist.   Eyes:     Pupils: Pupils are equal, round, and reactive to light.    Cardiovascular:     Rate and Rhythm: Normal rate and regular rhythm.     Pulses: Normal pulses.     Heart sounds: Normal heart sounds.  Pulmonary:     Effort: Pulmonary effort is normal.     Breath sounds: Normal breath sounds. No wheezing or rhonchi.  Abdominal:     General: There is distension.     Palpations: Abdomen is soft.   Musculoskeletal:     Cervical back: Normal range of motion.   Skin:    General: Skin is warm.   Neurological:     General: No focal deficit present.     Mental Status: She is alert and oriented to person, place, and time. Mental status is at baseline.       Ancillary Information    Past Medical History:  Diagnosis Date   Allergic genetic state    Arthritis    Bladder  spasms    COPD (chronic obstructive pulmonary disease) (HCC)    mild   COVID 2021   very mild   Dyspnea    GERD (gastroesophageal reflux disease)    History of kidney stones    HLD (hyperlipidemia)    Pneumonia    Pulmonary mass      Family History  Problem Relation Age of Onset   Diabetes Mother    Obesity Mother    Breast cancer Sister 20   Healthy Brother    Brain cancer Brother      Past Surgical History:  Procedure Laterality Date   BRONCHIAL BIOPSY  05/06/2022   Procedure: BRONCHIAL BIOPSIES;  Surgeon: Vergia Glasgow, MD;  Location: [facility];  Service: Pulmonary;;   BRONCHIAL NEEDLE ASPIRATION BIOPSY  05/06/2022   Procedure: BRONCHIAL NEEDLE ASPIRATION BIOPSIES;  Surgeon: Vergia Glasgow, MD;  Location: [facility];  Service: Pulmonary;;   COLONOSCOPY     COLONOSCOPY N/A 09/27/2021   Procedure: COLONOSCOPY;  Surgeon: Shane Darling, MD;  Location: [facility] END[facility]COPY;  Service: Endoscopy;  Laterality: N/A;   COLONOSCOPY WITH PROPOFOL  N/A 03/26/2018   Procedure: COLONOSCOPY WITH PROPOFOL ;  Surgeon: Deveron Fly, MD;  Location[facility];  Service: Endoscopy;  Laterality: N/A;   ESOPHAGOGASTRODUODENOSCOPY     HERNIA REPAIR     TONSILLECTOMY     VIDEO BRONCHOSCOPY WITH RADIAL ENDOBRONCHIAL ULTRASOUND  05/06/2022   Procedure: VIDEO BRONCHOSCOPY WITH RADIAL ENDOBRONCHIAL ULTRASOUND;  Surgeon: Vergia Glasgow, MD;  Location: [facility];  Service: Pulmonary;;    Social History   Socioeconomic History   Marital status: Widowed    Spouse name: Not on file   Number of children: Not on file   Years of education: Not on file   Highest education level: Not on file  Occupational History   Not on file  Tobacco Use   Smoking status: Never    Passive exposure: Past   Smokeless tobacco: Never  Vaping Use   Vaping status: Never Used  Substance and Sexual Activity   Alcohol use: Never   Drug use: Never   Sexual activity: Not on file  Other Topics Concern   Not  on file  Social History Narrative   Lives alone, grandchild lives with her   Social Drivers of Health   Financial Resource Strain: Patient Declined (03/28/2022)   Received from [facility]   Overall Financial Resource  Strain (CARDIA)    Difficulty of Paying Living Expenses: Patient declined  Food Insecurity: Patient Declined (03/28/2022)   Received from [facility]   Hunger Vital Sign    Worried About Running Out of Food in the Last Year: Patient declined    Ran Out of Food in the Last Year: Patient declined  Transportation Needs: Patient Declined (03/28/2022)   Received from [facility]   PRAPARE - Transportation    In the past 12 months, has lack of transportation kept you from medical appointments or from getting medications?: Patient declined    Lack of Transportation (Non-Medical): Patient declined  Physical Activity: Not on file  Stress: Not on file  Social Connections: Not on file  Intimate Partner Violence: Not on file     No Known Allergies   CBC    Component Value Date/Time   WBC 9.8 05/06/2022 0601   RBC 4.12 05/06/2022 0601   HGB 12.8 05/06/2022 0601   HGB 12.4 10/27/2011 0217   HCT 39.0 05/06/2022 0601   HCT 36.8 10/27/2011 0217   PLT 222 05/06/2022 0601   PLT 182 10/27/2011 0217   MCV 94.7 05/06/2022 0601   MCV 91 10/27/2011 0217   MCH 31.1 05/06/2022 0601   MCHC 32.8 05/06/2022 0601   RDW 13.9 05/06/2022 0601   RDW 12.9 10/27/2011 0217   LYMPHSABS 1.9 01/12/2022 1235   LYMPHSABS 2.3 10/27/2011 0217   MONOABS 0.5 01/12/2022 1235   MONOABS 0.7 10/27/2011 0217   EOSABS 0.2 01/12/2022 1235   EOSABS 0.2 10/27/2011 0217   BASOSABS 0.1 01/12/2022 1235   BASOSABS 0.0 10/27/2011 0217    Pulmonary Functions Testing Results:     No data to display          Outpatient Medications Prior to Visit  Medication Sig Dispense Refill   albuterol  (VENTOLIN  HFA) 108 (90 Base) MCG/ACT inhaler Inhale 2 puffs  into the lungs every 6 (six) hours as needed for wheezing or shortness of breath. 8 g 0   Ascorbic Acid (VITAMIN C PO) Take 2,000 mg by mouth in the morning.     aspirin 81 MG EC tablet Take 81 mg by mouth in the morning.     budesonide -formoterol  (SYMBICORT ) 160-4.5 MCG/ACT inhaler Inhale 2 puffs into the lungs 2 (two) times daily. 10.2 g 12   Calcium Carb-Cholecalciferol (CALTRATE 600+D3 PO) Take 1 tablet by mouth in the morning.     Cholecalciferol (VITAMIN D-3) 125 MCG (5000 UT) TABS Take 5,000 Units by mouth in the morning.     Cyanocobalamin (VITAMIN B-12) 2500 MCG SUBL Take 2,500 mcg by mouth in the morning.     fluticasone  (FLONASE ) 50 MCG/ACT nasal spray Place 1 spray into both nostrils daily. 18.2 mL 6   hydroxychloroquine (PLAQUENIL) 200 MG tablet Take 200 mg by mouth 2 (two) times daily.     ipratropium (ATROVENT  HFA) 17 MCG/ACT inhaler Inhale 2 puffs into the lungs every 4 (four) hours as needed for wheezing (SOB). 1 each 0   levocetirizine (XYZAL ) 5 MG tablet Take 1 tablet (5 mg total) by mouth every evening. 30 tablet 11   naproxen sodium (ALEVE) 220 MG tablet Take 440 mg by mouth daily as needed (pain.).     Omega-3 Fatty Acids (FISH OIL PO) Take 1 capsule by mouth in the morning.     Potassium 99 MG TABS Take 99 mg by mouth in the morning.     Tezepelumab -ekko (TEZSPIRE ) 210 MG/1. SOAJ Inject  210 mg into the skin every 28 (twenty-eight) days. 1.91 mL 4   valACYclovir (VALTREX) 1000 MG tablet Take 1,000-2,000 mg by mouth 2 (two) times daily as needed (cold sore/fever blister).     zinc sulfate 220 (50 Zn) MG capsule Take 220 mg by mouth in the morning.     No facility-administered medications prior to visit.

## 2023-10-10 ENCOUNTER — Other Ambulatory Visit: Payer: Self-pay

## 2023-10-10 ENCOUNTER — Other Ambulatory Visit (HOSPITAL_COMMUNITY): Payer: Self-pay

## 2023-10-10 NOTE — Progress Notes (Signed)
 Patient called [facility]. Patient reported that her daughter was attempting to give her the Tezspire  injection, and it misfired. The patient asked what could she do to get a new injection. I gave the patient the Tezspire  support program [phone]) to request a replacement. I advised patient to call us  back if there was anything else she needed from us . The patient was kind and understanding.

## 2023-10-12 ENCOUNTER — Ambulatory Visit: Payer: Self-pay | Admitting: Student in an Organized Health Care Education/Training Program

## 2023-10-12 ENCOUNTER — Ambulatory Visit: Admitting: Internal Medicine

## 2023-10-12 ENCOUNTER — Encounter: Payer: Self-pay | Admitting: Internal Medicine

## 2023-10-12 VITALS — BP 100/60 | HR 77 | Temp 98.0°F | Ht 61.0 in | Wt 173.2 lb

## 2023-10-12 DIAGNOSIS — J4541 Moderate persistent asthma with (acute) exacerbation: Secondary | ICD-10-CM | POA: Diagnosis not present

## 2023-10-12 MED ORDER — AZITHROMYCIN 250 MG PO TABS: ORAL_TABLET | ORAL | 0 refills | Status: DC

## 2023-10-12 MED ORDER — METHYLPREDNISOLONE ACETATE 80 MG/ML IJ SUSP
80.0000 mg | Freq: Once | INTRAMUSCULAR | Status: AC
  Administered 2023-10-12: 80 mg via INTRAMUSCULAR

## 2023-10-12 MED ORDER — PREDNISONE 20 MG PO TABS: 20.0000 mg | ORAL_TABLET | Freq: Every day | ORAL | 1 refills | Status: DC

## 2023-10-12 NOTE — Progress Notes (Signed)
 Subjective:   PATIENT ID: Kim Bentley GENDER:  DOB: 1941-01-14, MRN: [id number]   SYNOPSIS Kim Bentley is a pleasant 83 year old female moderate persistent asthma We initiated Tezepelumab  which she has tolerated well, and feels that her symptoms are much better with it. She did stop her Trelegy and switched to Symbicort  secondary to ocular symptoms. She is currently reporting minimal cough, and has no wheeze. She's reporting some exertional dyspnea. She otherwise has no complaints. She has not had any exacerbations since our last visit. We are also following her pulmonary nodules with serial CT's that show them to be stable. She does report some weight gain.  She underwent robotic assisted navigational bronchoscopy on 01/21/2022 to the RLL mass, in addition EBUS to multiple stations. The results from the biopsy has returned benign, with cultures growing Ralstonia Picketti. Similarly, result from the EBUS has been negative for malignancy. Repeat Navigational Bronchoscopy on 05/06/2022 showed benign cytology and cultures with H. Influenza. EBUS was not repeated during said procedure.   In regards to her RA, she has followed with rheumatology. Patient is off methotrexate and continues on hydrxychloroquine. She is a non-smoker.  Chief Complaint  Patient presents with   New Patient (Initial Visit)    Acute visit: severe sore throat, some more SOB than usual, yellow mucus, coughing fits      HPI Acute ASTHMA exacerbation Severe sore throat and feels some swelling  Will provide DEPOMEDROL SHOT 80 mg IM No need for NBES at this time Recommend Testing for COVID/RSV/FLU at [facility] if symptoms worsen Problems swallowing but able to vocalize and phonate No neck swelling NO ANGIOEDEMA    Objective:   Wt 173 lb 3.2 oz (78.6 kg)   BMI 33.83 kg/m   BP 100/60 (BP Location: Right Arm, Patient Position: Sitting, Cuff Size: Normal)   Pulse 77   Temp 98 F (36.7 C) (Oral)   Ht 5' 1 (1.549 m)    Wt 173 lb 3.2 oz (78.6 kg)   SpO2 95%   BMI 32.73 kg/m     Review of Systems: Gen:  Denies  fever, sweats, chills weight loss  HEENT: Denies blurred vision, double vision, ear pain, eye pain, hearing loss, nose bleeds, +sore throat Cardiac:  No dizziness, chest pain or heaviness, chest tightness,edema, No JVD Resp:   +cough, -sputum production, +shortness of breath,-wheezing, -hemoptysis,  Other:  All other systems negative   Physical Examination:   General Appearance: No distress  EYES PERRLA, EOM intact.   NECK Supple, No JVD THROAT, no erythema or exudates Pulmonary: normal breath sounds, No wheezing.  CardiovascularNormal S1,S2.  No m/r/g.   Abdomen: Benign, Soft, non-tender. Neurology UE/LE 5/5 strength, no focal deficits Ext pulses intact, cap refill intact ALL OTHER ROS ARE NEGATIVE     Ancillary Information    Past Medical History:  Diagnosis Date   Allergic genetic state    Arthritis    Bladder spasms    COPD (chronic obstructive pulmonary disease) (HCC)    mild   COVID 2021   very mild   Dyspnea    GERD (gastroesophageal reflux disease)    History of kidney stones    HLD (hyperlipidemia)    Pneumonia    Pulmonary mass      Family History  Problem Relation Age of Onset   Diabetes Mother    Obesity Mother    Breast cancer Sister 43   Healthy Brother    Brain cancer Brother  Past Surgical History:  Procedure Laterality Date   BRONCHIAL BIOPSY  05/06/2022   Procedure: BRONCHIAL BIOPSIES;  Surgeon: Vergia Glasgow, MD;  Location: [facility] ENDOSCOPY;  Service: Pulmonary;;   BRONCHIAL NEEDLE ASPIRATION BIOPSY  05/06/2022   Procedure: BRONCHIAL NEEDLE ASPIRATION BIOPSIES;  Surgeon: Vergia Glasgow, MD;  Location: [facility];  Service: Pulmonary;;   COLONOSCOPY     COLONOSCOPY N/A 09/27/2021   Procedure: COLONOSCOPY;  Surgeon: Shane Darling, MD;  Location: [facility] ENDOSCOPY;  Service: Endoscopy;  Laterality: N/A;   COLONOSCOPY WITH PROPOFOL  N/A  03/26/2018   Procedure: COLONOSCOPY WITH PROPOFOL ;  Surgeon: Deveron Fly, MD;  Location[facility];  Service: Endoscopy;  Laterality: N/A;   ESOPHAGOGASTRODUODENOSCOPY     HERNIA REPAIR     TONSILLECTOMY     VIDEO BRONCHOSCOPY WITH RADIAL ENDOBRONCHIAL ULTRASOUND  05/06/2022   Procedure: VIDEO BRONCHOSCOPY WITH RADIAL ENDOBRONCHIAL ULTRASOUND;  Surgeon: Vergia Glasgow, MD;  Location: [facility]OS[facility];  Service: Pulmonary;;    Social History   Socioeconomic History   Marital status: Widowed    Spouse name: Not on file   Number of children: Not on file   Years of education: Not on file   Highest education level: Not on file  Occupational History   Not on file  Tobacco Use   Smoking status: Never    Passive exposure: Past   Smokeless tobacco: Never  Vaping Use   Vaping status: Never Used  Substance and Sexual Activity   Alcohol use: Never   Drug use: Never   Sexual activity: Not on file  Other Topics Concern   Not on file  Social History Narrative   Lives alone, grandchild lives with her   Social Drivers of Health   Financial Resource Strain: Patient Declined (03/28/2022)   [facility]   Overall Financial Resource Strain (CARDIA)    Difficulty of Paying Living Expenses: Patient declined  Food Insecurity: Patient Declined (03/28/2022)   Received from [facility]   Hunger Vital Sign    Within the past 12 months, you worried that your food would run out before you got the money to buy more.: Patient declined    Within the past 12 months, the food you bought just didn't last and you didn't have money to get more.: Patient declined  Transportation Needs: Patient Declined (03/28/2022)   Received from [facility]   PRAPARE - Transportation    In the past 12 months, has lack of transportation kept you from medical appointments or from getting medications?: Patient declined    Lack of Transportation  (Non-Medical): Patient declined  Physical Activity: Not on file  Stress: Not on file  Social Connections: Not on file  Intimate Partner Violence: Not on file     No Known Allergies   CBC    Component Value Date/Time   WBC 9.8 05/06/2022 0601   RBC 4.12 05/06/2022 0601   HGB 12.8 05/06/2022 0601   HGB 12.4 10/27/2011 0217   HCT 39.0 05/06/2022 0601   HCT 36.8 10/27/2011 0217   PLT 222 05/06/2022 0601   PLT 182 10/27/2011 0217   MCV 94.7 05/06/2022 0601   MCV 91 10/27/2011 0217   MCH 31.1 05/06/2022 0601   MCHC 32.8 05/06/2022 0601   RDW 13.9 05/06/2022 0601   RDW 12.9 10/27/2011 0217   LYMPHSABS 1.9 01/12/2022 1235   LYMPHSABS 2.3 10/27/2011 0217   MONOABS 0.5 01/12/2022 1235   MONOABS 0.7 10/27/2011 0217  EOSABS 0.2 01/12/2022 1235   EOSABS 0.2 10/27/2011 0217   BASOSABS 0.1 01/12/2022 1235   BASOSABS 0.0 10/27/2011 0217    Pulmonary Functions Testing Results:     No data to display          Outpatient Medications Prior to Visit  Medication Sig Dispense Refill   albuterol  (VENTOLIN  HFA) 108 (90 Base) MCG/ACT inhaler Inhale 2 puffs into the lungs every 6 (six) hours as needed for wheezing or shortness of breath. 8 g 0   Ascorbic Acid (VITAMIN C PO) Take 2,000 mg by mouth in the morning.     aspirin 81 MG EC tablet Take 81 mg by mouth in the morning.     budesonide -formoterol  (SYMBICORT ) 160-4.5 MCG/ACT inhaler Inhale 2 puffs into the lungs 2 (two) times daily. 10.2 g 12   Calcium Carb-Cholecalciferol (CALTRATE 600+D3 PO) Take 1 tablet by mouth in the morning.     Cholecalciferol (VITAMIN D-3) 125 MCG (5000 UT) TABS Take 5,000 Units by mouth in the morning.     Cyanocobalamin (VITAMIN B-12) 2500 MCG SUBL Take 2,500 mcg by mouth in the morning.     hydroxychloroquine (PLAQUENIL) 200 MG tablet Take 200 mg by mouth 2 (two) times daily.     levocetirizine (XYZAL ) 5 MG tablet Take 1 tablet (5 mg total) by mouth every evening. 30 tablet 11   naproxen sodium (ALEVE) 220  MG tablet Take 440 mg by mouth daily as needed (pain.).     Omega-3 Fatty Acids (FISH OIL PO) Take 1 capsule by mouth in the morning.     Potassium 99 MG TABS Take 99 mg by mouth in the morning.     Tezepelumab -ekko (TEZSPIRE ) 210 MG/1. SOAJ Inject 210 mg into the skin every 28 (twenty-eight) days. 1.91 mL 4   valACYclovir (VALTREX) 1000 MG tablet Take 1,000-2,000 mg by mouth 2 (two) times daily as needed (cold sore/fever blister).     zinc sulfate 220 (50 Zn) MG capsule Take 220 mg by mouth in the morning.     fluticasone  (FLONASE ) 50 MCG/ACT nasal spray Place 1 spray into both nostrils daily. 18.2 mL 6   No facility-administered medications prior to visit.      Assessment & Plan:   Moderate Persistent Asthma Acute asthma exacerbation likely from viral pharyngitis viral bronchitis We will provide Depo-Medrol shot 80 mg IM in the office today Prednisone  20 mg daily for the next 7 days Start Z-Pak  Avoid Allergens and Irritants Avoid secondhand smoke Avoid SICK contacts Recommend  Masking  when appropriate Recommend Keep up-to-date with vaccinations  Patient instructed to go to the ER if symptoms get worse   MEDICATION ADJUSTMENTS/LABS AND TESTS ORDERED: Depo-Medrol IM shot in the office Prednisone  Z-Pak   CURRENT MEDICATIONS REVIEWED AT LENGTH WITH PATIENT TODAY   Patient  satisfied with Plan of action and management. All questions answered   Follow up 2 weeks with Dr Darnelle Elders   I spent a total of 45 mins minutes reviewing chart data, face-to-face evaluation with the patient, counseling and coordination of care as detailed above.      Lady Pier, M.D.  [facility]  Medical Director [facility] Medical Director [facility] Cardio-Pulmonary Department

## 2023-10-12 NOTE — Telephone Encounter (Signed)
 Noted. Nothing further needed.

## 2023-10-12 NOTE — Patient Instructions (Signed)
 Steroid shot in the office today Prednisone  20 mg daily for 7 days Start Z-Pak  Avoid Allergens and Irritants Avoid secondhand smoke Avoid SICK contacts Recommend  Masking  when appropriate Recommend Keep up-to-date with vaccinations   Please go to ER if symptoms get worse

## 2023-10-12 NOTE — Telephone Encounter (Signed)
 FYI Only or Action Required?: FYI only for provider.  Patient is followed in Pulmonology for ILD, last seen on 10/05/2023 by Vergia Glasgow, MD. Called Nurse Triage reporting Sore Throat, Fever, Cough, Shortness of Breath, Hoarse, Headache, and Chills. Symptoms began a week ago. Interventions attempted: OTC medications: aleve, Rescue inhaler, Maintenance inhaler, and Nebulizer treatments. Symptoms are: gradually worsening.  Triage Disposition: See HCP Within 4 Hours (Or PCP Triage)  Patient/caregiver understands and will follow disposition?: Yes       Copied from CRM [id number]. Topic: Clinical - Red Word Triage >> Oct 12, 2023  9:28 AM Kim Bentley wrote: Red Word that prompted transfer to Nurse Triage: pt has a severe sore throat.  Has had fever, at least 100.  She is unable to eat, hard to swallow.  Regular dr Bartholome Ligas see her.  Should she come here, or go to UC?  Lots of mucus, has spells of coughing, almost chokes her.   Reason for Disposition  [1] Longstanding difficulty breathing (e.g., CHF, COPD, emphysema) AND [2] WORSE than normal  Answer Assessment - Initial Assessment Questions E2C2 Pulmonary Triage - Initial Assessment Questions Chief Complaint (e.g., cough, sob, wheezing, fever, chills, sweat or additional symptoms) *Go to specific symptom protocol after initial questions. Don't know if nose or where but from throat mucus and nose too, yellow color, using inhaler, SOB with coughing spells 9/10 pain when swallow Fever temp max 100 F, chills, took some aleve, soaking wet the last 2 nights Eating and drinking okay like normal but just painful Was supposed to take tespire on 6/16 but it broke so just got to take it on 6/18 had to get new one  Have you tested for COVID or Flu? Note: If not, ask patient if a home test can be taken. If so, instruct patient to call back for positive results. No  MEDICINES:   Have you used any OTC meds to help with symptoms? Yes If yes, ask What  medications? aleve  Have you used your inhalers/maintenance medication? Yes If yes, What medications? Maintenance inhaler, using flonase , using albuterol  inhaler several times today use it 2x/day anyway but having to use more, also using nebulizer   If inhaler, ask How many puffs and how often? Note: Review instructions on medication in the chart. Using more often than usual, does help  OXYGEN: Do you wear supplemental oxygen? No  Do you monitor your oxygen levels? No  1. ONSET: When did the throat start hurting? (Hours or days ago)      About a week ago or more, right after came in to see him, was a little bit sore, was there last Monday I believe 2. SEVERITY: How bad is the sore throat? (Scale 1-10; mild, moderate or severe)   - MILD (1-3):  Doesn't interfere with eating or normal activities.   - MODERATE (4-7): Interferes with eating some solids and normal activities.   - SEVERE (8-10):  Excruciating pain, interferes with most normal activities.   - SEVERE WITH DYSPHAGIA (10): Can't swallow liquids, drooling.     Drooling a little bit 3. STREP EXPOSURE: Has there been any exposure to strep within the past week? If Yes, ask: What type of contact occurred?      [name] was sick but not sure what it was but throat was red 4.  VIRAL SYMPTOMS: Are there any symptoms of a cold, such as a runny nose, cough, hoarse voice or red eyes?      Hoarse voice, cough,  runny nose, watery eyes with coughing 6. PUS ON THE TONSILS: Is there pus on the tonsils in the back of your throat?     Feels like food gets choked back there don't know if swollen or what 7. OTHER SYMPTOMS: Do you have any other symptoms? (e.g., difficulty breathing, headache, rash)     Headache 5-6/10  No PCP availability, scheduled with pulm for this am, advised call back or head to hospital if worsening symptoms such as worsening SOB or higher fever, cannot swallow, or new symptoms like chest  pain  Protocols used: Sore Throat-A-AH, Breathing Difficulty-A-AH

## 2023-10-23 ENCOUNTER — Ambulatory Visit
Admission: RE | Admit: 2023-10-23 | Discharge: 2023-10-23 | Disposition: A | Discharge status: Home / Self Care | Source: Ambulatory Visit | Attending: Family Medicine | Admitting: Family Medicine

## 2023-10-23 DIAGNOSIS — Z1231 Encounter for screening mammogram for malignant neoplasm of breast: Secondary | ICD-10-CM | POA: Diagnosis present

## 2023-10-24 ENCOUNTER — Other Ambulatory Visit (HOSPITAL_COMMUNITY): Payer: Self-pay

## 2023-10-26 ENCOUNTER — Other Ambulatory Visit: Payer: Self-pay | Admitting: Student in an Organized Health Care Education/Training Program

## 2023-10-26 DIAGNOSIS — J4540 Moderate persistent asthma, uncomplicated: Secondary | ICD-10-CM

## 2023-10-26 NOTE — Telephone Encounter (Signed)
 Copied from CRM [id number]. Topic: Clinical - Medication Refill >> Oct 26, 2023  1:38 PM Tianna S wrote: Medication: Tezepelumab -ekko (TEZSPIRE ) 210 MG/1. SOAJ  Has the patient contacted their pharmacy? Yes (Agent: If no, request that the patient contact the pharmacy for the refill. If patient does not wish to contact the pharmacy document the reason why and proceed with request.) (Agent: If yes, when and what did the pharmacy advise?)  This is the patient's preferred pharmacy:  [facility] [address] [place], [place], [address] Phone [phone] Fax [phone]  Is this the correct pharmacy for this prescription? Yes If no, delete pharmacy and type the correct one.   Has the prescription been filled recently? Yes  Is the patient out of the medication? No  Has the patient been seen for an appointment in the last year OR does the patient have an upcoming appointment? Yes  Can we respond through MyChart? Yes  Agent: Please be advised that Rx refills may take up to 3 business days. We ask that you follow-up with your pharmacy.

## 2023-10-30 ENCOUNTER — Other Ambulatory Visit: Payer: Self-pay

## 2023-10-30 MED ORDER — TEZSPIRE 210 MG/1.91ML ~~LOC~~ SOAJ
210.0000 mg | SUBCUTANEOUS | 4 refills | Status: DC
  Filled 2023-10-30 – 2023-10-31 (×2): qty 1.91, 28d supply, fill #0
  Filled 2023-11-22: qty 1.91, 28d supply, fill #1
  Filled 2023-12-18: qty 1.91, 28d supply, fill #2
  Filled 2024-01-17: qty 1.91, 28d supply, fill #3
  Filled 2024-02-13 – 2024-02-15 (×2): qty 1.91, 28d supply, fill #4

## 2023-10-30 NOTE — Telephone Encounter (Signed)
 Refill sent for TEZSPIRE  to [facility]: [phone]   Dose: 210mg  subcut every 4 weeks  Last OV: 10/12/2023 Provider: Dr. Isadora  Next OV: 01/18/2024  Sherry Pennant, PharmD, MPH, BCPS Clinical Pharmacist (Rheumatology and Pulmonology)

## 2023-10-31 ENCOUNTER — Telehealth: Payer: Self-pay

## 2023-10-31 ENCOUNTER — Encounter (INDEPENDENT_AMBULATORY_CARE_PROVIDER_SITE_OTHER): Payer: Self-pay

## 2023-10-31 ENCOUNTER — Other Ambulatory Visit: Payer: Self-pay

## 2023-10-31 ENCOUNTER — Other Ambulatory Visit (HOSPITAL_COMMUNITY): Payer: Self-pay

## 2023-10-31 NOTE — Progress Notes (Signed)
 Specialty Pharmacy Refill Coordination Note  Spoke with Geraldine Sandberg is a 83 y.o. female contacted today regarding refills of specialty medication(s) Tezepelumab -ekko (Tezspire )  Injection date: 11/02/23   Patient requested: Delivery   Delivery date: 11/01/23   Verified address: [address] [place] [address]  Medication will be filled on 10/31/23.

## 2023-10-31 NOTE — Telephone Encounter (Signed)
 Copied from CRM [id number]. Topic: Clinical - Medication Refill >> Oct 26, 2023  1:38 PM Tianna S wrote: Medication: Tezepelumab -ekko (TEZSPIRE ) 210 MG/1. SOAJ  Has the patient contacted their pharmacy? Yes (Agent: If no, request that the patient contact the pharmacy for the refill. If patient does not wish to contact the pharmacy document the reason why and proceed with request.) (Agent: If yes, when and what did the pharmacy advise?)  This is the patient's preferred pharmacy:  [facility] [address] [place], [place], [address] Phone [phone] Fax [phone]  Is this the correct pharmacy for this prescription? Yes If no, delete pharmacy and type the correct one.   Has the prescription been filled recently? Yes  Is the patient out of the medication? No  Has the patient been seen for an appointment in the last year OR does the patient have an upcoming appointment? Yes  Can we respond through MyChart? Yes  Agent: Please be advised that Rx refills may take up to 3 business days. We ask that you follow-up with your pharmacy. >> Oct 31, 2023  2:10 PM Russell PARAS wrote: Kim Bentley, with [facility], is contacting clinic regarding refill request for Kim Bentley. Pt contacted them and notified he did not have any further refills. Reviewed chart and note that previous request was sent in on 07/03. Appears to be signed but was sent to [facility].   Refills can be sent either by:  Electronically to [facility] pharmacy By Fax# [phone] Or verbally, CB# [phone]  Medication has been filled and patient should receive medication on 11/01/2023. Nothing else further needed.

## 2023-11-07 ENCOUNTER — Other Ambulatory Visit (HOSPITAL_COMMUNITY): Payer: Self-pay

## 2023-11-07 NOTE — Telephone Encounter (Unsigned)
 Copied from CRM [id number]. Topic: Clinical - Medication Question >> Nov 07, 2023  1:27 PM Leila C wrote: Reason for CRM: Gregorio from [employer] [phone] regarding Tezepelumab -ekko (TEZSPIRE ) 210 MG/1. 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 Please fax# [phone].    ----------------------------------------------------------------------- From previous Reason for Contact - Medication Refill:

## 2023-11-07 NOTE — Telephone Encounter (Signed)
 Patient does not receive Tezspire  through [employer]. She receives it through [facility] but it seems like she has been receiving it through [facility] pharmacy since starting in office? Unclear because patient does not have any recollection of the pharmacy that's been calling. Provided her phone number for [facility] and reviewed that she will not be due for fill another couple of weeks  Rx on file has multiple refills remaining  Sherry Pennant, PharmD, MPH, BCPS, CPP Clinical Pharmacist (Rheumatology and Pulmonology)

## 2023-11-15 NOTE — Addendum Note (Signed)
 Addended by: DAYNE SHERRY RAMAN on: 11/15/2023 09:59 AM   Modules accepted: Level of Service

## 2023-11-22 ENCOUNTER — Other Ambulatory Visit: Payer: Self-pay

## 2023-11-22 ENCOUNTER — Other Ambulatory Visit (HOSPITAL_COMMUNITY): Payer: Self-pay

## 2023-11-24 ENCOUNTER — Other Ambulatory Visit: Payer: Self-pay

## 2023-11-24 ENCOUNTER — Other Ambulatory Visit: Payer: Self-pay | Admitting: Pharmacy Technician

## 2023-11-24 NOTE — Progress Notes (Signed)
 Specialty Pharmacy Refill Coordination Note  Kim Bentley is a 83 y.o. female contacted today regarding refills of specialty medication(s) Tezepelumab -ekko (Tezspire )   Patient requested Delivery   Delivery date: 11/28/23   Verified address: [address]  [place] [address]   Medication will be filled on 11/27/23.

## 2023-12-15 ENCOUNTER — Other Ambulatory Visit: Payer: Self-pay

## 2023-12-17 ENCOUNTER — Encounter (INDEPENDENT_AMBULATORY_CARE_PROVIDER_SITE_OTHER): Payer: Self-pay

## 2023-12-18 ENCOUNTER — Other Ambulatory Visit: Payer: Self-pay

## 2023-12-18 ENCOUNTER — Ambulatory Visit
Admission: RE | Admit: 2023-12-18 | Discharge: 2023-12-18 | Disposition: A | Discharge status: Home / Self Care | Source: Ambulatory Visit | Attending: Student in an Organized Health Care Education/Training Program | Admitting: Student in an Organized Health Care Education/Training Program

## 2023-12-18 DIAGNOSIS — R918 Other nonspecific abnormal finding of lung field: Secondary | ICD-10-CM | POA: Insufficient documentation

## 2023-12-18 NOTE — Progress Notes (Signed)
 Specialty Pharmacy Refill Coordination Note  Kim Bentley is a 83 y.o. female contacted today regarding refills of specialty medication(s) Tezepelumab -ekko (Tezspire )   Patient requested (Patient-Rptd) Delivery   Delivery date: 12/19/23   Verified address: (Patient-Rptd) [address] [place]   [address]   Medication will be filled on 12/18/23.

## 2023-12-19 ENCOUNTER — Other Ambulatory Visit: Payer: Self-pay

## 2023-12-19 NOTE — Progress Notes (Signed)
 Clinical Intervention Note  Clinical Intervention Notes: Patient reported starting amoxicillin . No DDIs identified with Tezspire    Clinical Intervention Outcomes: Prevention of an adverse drug event   Advertising account planner

## 2023-12-27 ENCOUNTER — Other Ambulatory Visit: Payer: Self-pay

## 2023-12-27 ENCOUNTER — Other Ambulatory Visit (HOSPITAL_COMMUNITY): Payer: Self-pay

## 2023-12-29 ENCOUNTER — Ambulatory Visit: Admitting: Student in an Organized Health Care Education/Training Program

## 2023-12-29 ENCOUNTER — Ambulatory Visit: Payer: Self-pay

## 2023-12-29 ENCOUNTER — Encounter: Payer: Self-pay | Admitting: Student in an Organized Health Care Education/Training Program

## 2023-12-29 VITALS — BP 128/80 | HR 78 | Temp 97.1°F | Ht 61.0 in | Wt 173.0 lb

## 2023-12-29 DIAGNOSIS — R918 Other nonspecific abnormal finding of lung field: Secondary | ICD-10-CM

## 2023-12-29 DIAGNOSIS — J4541 Moderate persistent asthma with (acute) exacerbation: Secondary | ICD-10-CM

## 2023-12-29 DIAGNOSIS — M0579 Rheumatoid arthritis with rheumatoid factor of multiple sites without organ or systems involvement: Secondary | ICD-10-CM

## 2023-12-29 DIAGNOSIS — R051 Acute cough: Secondary | ICD-10-CM

## 2023-12-29 LAB — POC COVID19 BINAXNOW: SARS Coronavirus 2 Ag: NEGATIVE

## 2023-12-29 MED ORDER — AZITHROMYCIN 250 MG PO TABS: ORAL_TABLET | ORAL | 0 refills | Status: DC

## 2023-12-29 MED ORDER — PREDNISONE 20 MG PO TABS: 40.0000 mg | ORAL_TABLET | Freq: Every day | ORAL | 0 refills | Status: AC

## 2023-12-29 NOTE — Progress Notes (Signed)
 Assessment & Plan:   #Moderate persistent asthma with acute exacerbation  She has a history of asthma with multiple previous exacerbations. PFT's from 11/2021 showed scooping of the expiratory flow volume loop with a preserved ratio but impaired FEV1. She has a strong family history of asthma, and I suspect asthma or asthma/COPD overlap as her underlying etiology. CT high resolution on 11/23/2021 did not show any signs of ILD. 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 Given this, she is maintained on ICS/LABA, and we initiated Tezepelumab  to which she has responded very well.   Today, she is presenting with acute symptoms of cough and wheeze triggered by a viral illness. She has multiple sick contacts (everybody in the family is ill). I will treat this with a short course of antibiotics and steroids. She will continue on ICS/LABA with Symbicort  as well as biologics with tezepelumab .   - Continue Tezepelumab  - Continue Symbicort  - predniSONE  (DELTASONE ) 20 MG tablet; Take 2 tablets (40 mg total) by mouth daily with breakfast for 5 days.  Dispense: 10 tablet; Refill: 0 - azithromycin  (ZITHROMAX  Z-PAK) 250 MG tablet; Take 2 tablets on Day 1 and then 1 tablet daily till gone.  Dispense: 6 each; Refill: 0  #Right lower lobe lung mass   Presenting with both right upper lobe and right lower lobe masses with associated lymphadenopathy and is status post 2 robotic assisted navigational bronchoscopies to the right lower lobe and right upper lobes with cytology returning benign. EBUS was similarly benign. These findings most likely represent benign mass/nodule in the setting of immunosuppression as well as rheumatoid arthritis. She has stopped her methotrexate and these masses have been unchanged on 1 year interval repeat CT. She has had a repeat CT on 12/18/2023 which remains to be read. I have personally reviewed the CT today, and the two masses are unchanged. The other nodule  that we were monitoring is no longer present on the CT. I will continue with radiographic monitoring, likely spacing out to the one year mark. Will discuss date of repeat CT chest during her next follow up.  - repeat CT at one year mark, order at follow up.  Return in about 4 months (around 04/29/2024).  I spent 30 minutes caring for this patient today, including preparing to see the patient, obtaining a medical history , reviewing a separately obtained history, performing a medically appropriate examination and/or evaluation, counseling and educating the patient/family/caregiver, ordering medications, tests, or procedures, documenting clinical information in the electronic health record, and independently interpreting results (not separately reported/billed) and communicating results to the patient/family/caregiver  Kim November, MD [facility]   End of visit medications:  Meds ordered this encounter  Medications   predniSONE  (DELTASONE ) 20 MG tablet    Sig: Take 2 tablets (40 mg total) by mouth daily with breakfast for 5 days.    Dispense:  10 tablet    Refill:  0   azithromycin  (ZITHROMAX  Z-PAK) 250 MG tablet    Sig: Take 2 tablets on Day 1 and then 1 tablet daily till gone.    Dispense:  6 each    Refill:  0     Current Outpatient Medications:    albuterol  (VENTOLIN  HFA) 108 (90 Base) MCG/ACT inhaler, Inhale 2 puffs into the lungs every 6 (six) hours as needed for wheezing or shortness of breath., Disp: 8 g, Rfl: 0   Ascorbic Acid (VITAMIN C PO), Take 2,000 mg by mouth in  the morning., Disp: , Rfl:    aspirin 81 MG EC tablet, Take 81 mg by mouth in the morning., Disp: , Rfl:    budesonide -formoterol  (SYMBICORT ) 160-4.5 MCG/ACT inhaler, Inhale 2 puffs into the lungs 2 (two) times daily., Disp: 10.2 g, Rfl: 12   Calcium Carb-Cholecalciferol (CALTRATE 600+D3 PO), Take 1 tablet by mouth in the morning., Disp: , Rfl:    Cholecalciferol (VITAMIN D-3) 125 MCG (5000  UT) TABS, Take 5,000 Units by mouth in the morning., Disp: , Rfl:    Cyanocobalamin (VITAMIN B-12) 2500 MCG SUBL, Take 2,500 mcg by mouth in the morning., Disp: , Rfl:    fluticasone  (FLONASE ) 50 MCG/ACT nasal spray, Place 1 spray into both nostrils daily., Disp: 18.2 mL, Rfl: 6   hydroxychloroquine (PLAQUENIL) 200 MG tablet, Take 200 mg by mouth 2 (two) times daily., Disp: , Rfl:    levocetirizine (XYZAL ) 5 MG tablet, Take 1 tablet (5 mg total) by mouth every evening., Disp: 30 tablet, Rfl: 11   naproxen sodium (ALEVE) 220 MG tablet, Take 440 mg by mouth daily as needed (pain.)., Disp: , Rfl:    Omega-3 Fatty Acids (FISH OIL PO), Take 1 capsule by mouth in the morning., Disp: , Rfl:    Potassium 99 MG TABS, Take 99 mg by mouth in the morning., Disp: , Rfl:    Tezepelumab -ekko (TEZSPIRE ) 210 MG/1. SOAJ, Inject 210 mg into the skin every 28 (twenty-eight) days., Disp: 1.91 mL, Rfl: 4   valACYclovir (VALTREX) 1000 MG tablet, Take 1,000-2,000 mg by mouth 2 (two) times daily as needed (cold sore/fever blister)., Disp: , Rfl:    zinc sulfate 220 (50 Zn) MG capsule, Take 220 mg by mouth in the morning., Disp: , Rfl:    azithromycin  (ZITHROMAX  Z-PAK) 250 MG tablet, Take 2 tablets on Day 1 and then 1 tablet daily till gone., Disp: 6 each, Rfl: 0   predniSONE  (DELTASONE ) 20 MG tablet, Take 2 tablets (40 mg total) by mouth daily with breakfast for 5 days., Disp: 10 tablet, Rfl: 0   Subjective:   PATIENT ID: Kim Bentley GENDER: female DOB: Aug 23, 1940, MRN: [id number]  Chief Complaint  Patient presents with   Medical Management of Chronic Issues    Congestion. Tight chest. Cough with white and yellow. Wheezing. Symptoms for a week.    HPI  Kim Bentley is a pleasant 83 year old female presenting for an acute visit.  Return Visit 10/05/2023:   We initiated Tezepelumab  which she has tolerated well, and feels that her symptoms are much better with it. She did stop her Trelegy and switched to  Symbicort  secondary to ocular symptoms. She is currently reporting minimal cough, and has no wheeze. She's reporting some exertional dyspnea. She otherwise has no complaints. She has not had any exacerbations since our last visit. We are also following her pulmonary nodules with serial CT's that show them to be stable. She does report some weight gain.  Return Visit 12/29/2023:  She presents for an acute visit with symptoms of cough, shortness of breath, and wheeze for a week. Multiple sick contacts reported (daughter, grandchild). She's done well outside of this, and is tolerating tezepelumab  well. She feels much better and is currently only maintained on Symbicort , with good response. COVID test is negative.   She underwent robotic assisted navigational bronchoscopy on 01/21/2022 to the RLL mass, in addition EBUS to multiple stations. The results from the biopsy has returned benign, with cultures growing Ralstonia Picketti. Similarly, result from the  EBUS has been negative for malignancy. Repeat Navigational Bronchoscopy on 05/06/2022 showed benign cytology and cultures with H. Influenza. EBUS was not repeated during said procedure.   In regards to her RA, she has followed with rheumatology. Patient is off methotrexate and continues on hydrxychloroquine. She is a non-smoker.    Ancillary information including prior medications, full medical/surgical/family/social histories, and PFTs (when available) are listed below and have been reviewed.   Review of Systems  Constitutional:  Negative for chills and fever.  Respiratory:  Positive for cough, shortness of breath and wheezing.   Cardiovascular:  Negative for chest pain.  Skin:  Negative for rash.     Objective:   Vitals:   12/29/23 1118  BP: 128/80  Pulse: 78  Temp: (!) 97.1 F (36.2 C)  SpO2: 95%  Weight: 173 lb (78.5 kg)  Height: 5' 1 (1.549 m)   95% on RA  BMI Readings from Last 3 Encounters:  12/29/23 32.69 kg/m  10/12/23 32.73  kg/m  10/05/23 34.22 kg/m   Wt Readings from Last 3 Encounters:  12/29/23 173 lb (78.5 kg)  10/12/23 173 lb 3.2 oz (78.6 kg)  10/05/23 175 lb 3.2 oz (79.5 kg)    Physical Exam Constitutional:      Appearance: Normal appearance. She is not ill-appearing.  Cardiovascular:     Rate and Rhythm: Normal rate and regular rhythm.     Pulses: Normal pulses.     Heart sounds: Normal heart sounds.  Pulmonary:     Effort: Pulmonary effort is normal.     Breath sounds: Wheezing present. No rhonchi or rales.  Neurological:     General: No focal deficit present.     Mental Status: She is alert and oriented to person, place, and time. Mental status is at baseline.       Ancillary Information    Past Medical History:  Diagnosis Date   Allergic genetic state    Arthritis    Bladder spasms    COPD (chronic obstructive pulmonary disease) (HCC)    mild   COVID 2021   very mild   Dyspnea    GERD (gastroesophageal reflux disease)    History of kidney stones    HLD (hyperlipidemia)    Pneumonia    Pulmonary mass      Family History  Problem Relation Age of Onset   Diabetes Mother    Obesity Mother    Breast cancer Sister 51   Healthy Brother    Brain cancer Brother      Past Surgical History:  Procedure Laterality Date   BRONCHIAL BIOPSY  05/06/2022   Procedure: BRONCHIAL BIOPSIES;  Surgeon: Isadora Hose, MD;  Location: [facility] ENDOSCOPY;  Service: Pulmonary;;   BRONCHIAL NEEDLE ASPIRATION BIOPSY  05/06/2022   Procedure: BRONCHIAL NEEDLE ASPIRATION BIOPSIES;  Surgeon: Isadora Hose, MD;  Location: [facility];  Service: Pulmonary;;   COLONOSCOPY     COLONOSCOPY N/A 09/27/2021   Procedure: COLONOSCOPY;  Surgeon: Maryruth Ole DASEN, MD;  Location: [facility] ENDOSCOPY;  Service: Endoscopy;  Laterality: N/A;   COLONOSCOPY WITH PROPOFOL  N/A 03/26/2018   Procedure: COLONOSCOPY WITH PROPOFOL ;  Surgeon: Gaylyn Gladis PENNER, MD;  Location: [facility] ENDOSCOPY;  Service: Endoscopy;  Laterality:  N/A;   ESOPHAGOGASTRODUODENOSCOPY     HERNIA REPAIR     TONSILLECTOMY     VIDEO BRONCHOSCOPY WITH RADIAL ENDOBRONCHIAL ULTRASOUND  05/06/2022   Procedure: VIDEO BRONCHOSCOPY WITH RADIAL ENDOBRONCHIAL ULTRASOUND;  Surgeon: Isadora Hose, MD;  Location: [facility] ENDOSCOPY;  Service: Pulmonary;;  Social History   Socioeconomic History   Marital status: Widowed    Spouse name: Not on file   Number of children: Not on file   Years of education: Not on file   Highest education level: Not on file  Occupational History   Not on file  Tobacco Use   Smoking status: Never    Passive exposure: Past   Smokeless tobacco: Never  Vaping Use   Vaping status: Never Used  Substance and Sexual Activity   Alcohol use: Never   Drug use: Never   Sexual activity: Not on file  Other Topics Concern   Not on file  Social History Narrative   Lives alone, grandchild lives with her   Social Drivers of Health   Financial Resource Strain: Low Risk  (12/11/2023)   Received from [facility]   Overall Financial Resource Strain (CARDIA)    Difficulty of Paying Living Expenses: Not hard at all  Food Insecurity: No Food Insecurity (12/11/2023)   Received from [facility]   Hunger Vital Sign    Within the past 12 months, you worried that your food would run out before you got the money to buy more.: Never true    Within the past 12 months, the food you bought just didn't last and you didn't have money to get more.: Never true  Transportation Needs: No Transportation Needs (12/11/2023)   Received from [facility] - Transportation    In the past 12 months, has lack of transportation kept you from medical appointments or from getting medications?: No    Lack of Transportation (Non-Medical): No  Physical Activity: Not on file  Stress: Not on file  Social Connections: Not on file  Intimate Partner Violence: Not on file     No Known Allergies    CBC    Component Value Date/Time   WBC 9.8 05/06/2022 0601   RBC 4.12 05/06/2022 0601   HGB 12.8 05/06/2022 0601   HGB 12.4 10/27/2011 0217   HCT 39.0 05/06/2022 0601   HCT 36.8 10/27/2011 0217   PLT 222 05/06/2022 0601   PLT 182 10/27/2011 0217   MCV 94.7 05/06/2022 0601   MCV 91 10/27/2011 0217   MCH 31.1 05/06/2022 0601   MCHC 32.8 05/06/2022 0601   RDW 13.9 05/06/2022 0601   RDW 12.9 10/27/2011 0217   LYMPHSABS 1.9 01/12/2022 1235   LYMPHSABS 2.3 10/27/2011 0217   MONOABS 0.5 01/12/2022 1235   MONOABS 0.7 10/27/2011 0217   EOSABS 0.2 01/12/2022 1235   EOSABS 0.2 10/27/2011 0217   BASOSABS 0.1 01/12/2022 1235   BASOSABS 0.0 10/27/2011 0217    Pulmonary Functions Testing Results:     No data to display          Outpatient Medications Prior to Visit  Medication Sig Dispense Refill   albuterol  (VENTOLIN  HFA) 108 (90 Base) MCG/ACT inhaler Inhale 2 puffs into the lungs every 6 (six) hours as needed for wheezing or shortness of breath. 8 g 0   Ascorbic Acid (VITAMIN C PO) Take 2,000 mg by mouth in the morning.     aspirin 81 MG EC tablet Take 81 mg by mouth in the morning.     budesonide -formoterol  (SYMBICORT ) 160-4.5 MCG/ACT inhaler Inhale 2 puffs into the lungs 2 (two) times daily. 10.2 g 12   Calcium Carb-Cholecalciferol (CALTRATE 600+D3 PO) Take 1 tablet by mouth in the morning.     Cholecalciferol (VITAMIN D-3) 125  MCG (5000 UT) TABS Take 5,000 Units by mouth in the morning.     Cyanocobalamin (VITAMIN B-12) 2500 MCG SUBL Take 2,500 mcg by mouth in the morning.     fluticasone  (FLONASE ) 50 MCG/ACT nasal spray Place 1 spray into both nostrils daily. 18.2 mL 6   hydroxychloroquine (PLAQUENIL) 200 MG tablet Take 200 mg by mouth 2 (two) times daily.     levocetirizine (XYZAL ) 5 MG tablet Take 1 tablet (5 mg total) by mouth every evening. 30 tablet 11   naproxen sodium (ALEVE) 220 MG tablet Take 440 mg by mouth daily as needed (pain.).     Omega-3 Fatty Acids (FISH  OIL PO) Take 1 capsule by mouth in the morning.     Potassium 99 MG TABS Take 99 mg by mouth in the morning.     Tezepelumab -ekko (TEZSPIRE ) 210 MG/1. SOAJ Inject 210 mg into the skin every 28 (twenty-eight) days. 1.91 mL 4   valACYclovir (VALTREX) 1000 MG tablet Take 1,000-2,000 mg by mouth 2 (two) times daily as needed (cold sore/fever blister).     zinc sulfate 220 (50 Zn) MG capsule Take 220 mg by mouth in the morning.     azithromycin  (ZITHROMAX  Z-PAK) 250 MG tablet Take 2 tablets on Day 1 and then 1 tablet daily till gone. (Patient not taking: Reported on 12/29/2023) 6 each 0   predniSONE  (DELTASONE ) 20 MG tablet Take 1 tablet (20 mg total) by mouth daily with breakfast. 7 days (Patient not taking: Reported on 12/29/2023) 7 tablet 1   No facility-administered medications prior to visit.

## 2023-12-29 NOTE — Telephone Encounter (Signed)
 I spoke with the patient and scheduled her an appt at 11:30 today.  Nothing further needed.

## 2023-12-29 NOTE — Telephone Encounter (Signed)
 FYI Only or Action Required?: Action required by provider: request for appointment.  Patient was last seen in primary care on .  Called Nurse Triage reporting Cough.  Symptoms began a week ago.  Interventions attempted: Prescription medications: Albuterol  inhaler.  Symptoms are: gradually worsening. Productive cough with wheezing, Albuterol  is not helping. Asking to be worked in.  Triage Disposition: See HCP Within 4 Hours (Or PCP Triage)  Patient/caregiver understands and will follow disposition?: Yes    Copied from CRM [id number]. Topic: Clinical - Red Word Triage >> Dec 29, 2023  8:13 AM Corean SAUNDERS wrote: Red Word that prompted transfer to Nurse Triage: Wheezing and chest tightness Reason for Disposition  [1] MILD difficulty breathing (e.g., minimal/no SOB at rest, SOB with walking, pulse < 100) AND [2] still present when not coughing  Answer Assessment - Initial Assessment Questions 1. ONSET: When did the cough begin?      1 weekn 2. SEVERITY: How bad is the cough today?      severe 3. SPUTUM: Describe the color of your sputum (e.g., none, dry cough; clear, white, yellow, green)     yellow 4. HEMOPTYSIS: Are you coughing up any blood? If Yes, ask: How much? (e.g., flecks, streaks, tablespoons, etc.)     no 5. DIFFICULTY BREATHING: Are you having difficulty breathing? If Yes, ask: How bad is it? (e.g., mild, moderate, severe)      mild 6. FEVER: Do you have a fever? If Yes, ask: What is your temperature, how was it measured, and when did it start?     no 7. CARDIAC HISTORY: Do you have any history of heart disease? (e.g., heart attack, congestive heart failure)      no 8. LUNG HISTORY: Do you have any history of lung disease?  (e.g., pulmonary embolus, asthma, emphysema)     COPD 9. PE RISK FACTORS: Do you have a history of blood clots? (or: recent major surgery, recent prolonged travel, bedridden)     NO 10. OTHER SYMPTOMS: Do you have any other  symptoms? (e.g., runny nose, wheezing, chest pain)       Wheezing 11. PREGNANCY: Is there any chance you are pregnant? When was your last menstrual period?       no 12. TRAVEL: Have you traveled out of the country in the last month? (e.g., travel history, exposures)       no  Protocols used: Cough - Acute Productive-A-AH

## 2024-01-01 ENCOUNTER — Ambulatory Visit: Payer: Self-pay | Admitting: Student in an Organized Health Care Education/Training Program

## 2024-01-04 ENCOUNTER — Ambulatory Visit: Payer: Self-pay | Admitting: Student in an Organized Health Care Education/Training Program

## 2024-01-04 ENCOUNTER — Ambulatory Visit (INDEPENDENT_AMBULATORY_CARE_PROVIDER_SITE_OTHER): Admitting: Pulmonary Disease

## 2024-01-04 ENCOUNTER — Encounter: Payer: Self-pay | Admitting: Pulmonary Disease

## 2024-01-04 VITALS — BP 122/68 | HR 76 | Temp 97.8°F | Ht 61.0 in | Wt 177.6 lb

## 2024-01-04 DIAGNOSIS — J45901 Unspecified asthma with (acute) exacerbation: Secondary | ICD-10-CM | POA: Diagnosis not present

## 2024-01-04 MED ORDER — METHYLPREDNISOLONE ACETATE 80 MG/ML IJ SUSP
80.0000 mg | Freq: Once | INTRAMUSCULAR | Status: AC
  Administered 2024-01-04: 80 mg via INTRAMUSCULAR

## 2024-01-04 MED ORDER — PREDNISONE 10 MG PO TABS: ORAL_TABLET | ORAL | 0 refills | Status: AC

## 2024-01-04 NOTE — Telephone Encounter (Signed)
 Per secure chat with Dr. Isadora- he asked that Dr. Malka see her today for an acute visit.  I spoke with the patient and scheduled her an appt for today at 1:15pm.  Nothing further needed.

## 2024-01-04 NOTE — Telephone Encounter (Signed)
 FYI Only or Action Required?: Action required by provider: update on patient condition.  Patient is followed in Pulmonology for ILD, last seen on 12/29/2023 by Isadora Hose, MD.  Called Nurse Triage reporting Cough.  Symptoms began yesterday.  Interventions attempted: Rescue inhaler and Maintenance inhaler.  Symptoms are: unchanged.  Triage Disposition: See Physician Within 24 Hours  Patient/caregiver understands and will follow disposition?: No, wishes to speak with PCP      Copied from CRM #[id number]. Topic: Clinical - Red Word Triage >> Jan 04, 2024 10:59 AM Kim Bentley wrote: Red Word that prompted transfer to Nurse Triage: coughing up mucus and wheezing Reason for Disposition  SEVERE coughing spells (e.g., whooping sound after coughing, vomiting after coughing)  Answer Assessment - Initial Assessment Questions E2C2 Pulmonary Triage - Initial Assessment Questions Chief Complaint (e.g., cough, sob, wheezing, fever, chills, sweat or additional symptoms) *Go to specific symptom protocol after initial questions. Wheezing, productive cough Recent AV and was given abx/prednisone  and finished yesterday -- sx have improved, and not completely  How long have symptoms been present? X 1 week  Have you tested for COVID or Flu? Note: If not, ask patient if a home test can be taken. If so, instruct patient to call back for positive results. No  MEDICINES:   Have you used any OTC meds to help with symptoms? No If yes, ask What medications? N/a  Have you used your inhalers/maintenance medication? No If yes, What medications? albuterol  (VENTOLIN  HFA) PRN - typically uses at bedtime, sometimes twice a day and provides relief Triager reviewed/reinforced albuterol  usage and SIG.   budesonide -formoterol  (SYMBICORT ) 160-4.5 MCG/ACT inhaler - as prescribed  If inhaler, ask How many puffs and how often? Note: Review instructions on medication in the chart. See  above  OXYGEN: Do you wear supplemental oxygen? No If yes, How many liters are you supposed to use? N/a  Do you monitor your oxygen levels? No If yes, What is your reading (oxygen level) today? N/a  What is your usual oxygen saturation reading?  (Note: Pulmonary O2 sats should be 90% or greater) N/a        1. ONSET: When did the cough begin?      See above 2. SEVERITY: How bad is the cough today?      N/a 3. SPUTUM: Describe the color of your sputum (e.g., none, dry cough; clear, white, yellow, green)     White/yellow 4. HEMOPTYSIS: Are you coughing up any blood? If Yes, ask: How much? (e.g., flecks, streaks, tablespoons, etc.)     denies 5. DIFFICULTY BREATHING: Are you having difficulty breathing? If Yes, ask: How bad is it? (e.g., mild, moderate, severe)      Mild Triager does appreciate audible mild wheezing with cough during call. Pt is speaking in full sentences.  6. FEVER: Do you have a fever? If Yes, ask: What is your temperature, how was it measured, and when did it start?     denies 7. CARDIAC HISTORY: Do you have any history of heart disease? (e.g., heart attack, congestive heart failure)      denies 8. LUNG HISTORY: Do you have any history of lung disease?  (e.g., pulmonary embolus, asthma, emphysema)     ILD 9. PE RISK FACTORS: Do you have a history of blood clots? (or: recent major surgery, recent prolonged travel, bedridden)     denies 10. OTHER SYMPTOMS: Do you have any other symptoms? (e.g., runny nose, wheezing, chest pain)  Wheezing, runny nose 11. PREGNANCY: Is there any chance you are pregnant? When was your last menstrual period?       N/a 12. TRAVEL: Have you traveled out of the country in the last month? (e.g., travel history, exposures)       N/a    Pt recently had acute visit with Dr Isadora and finished course of abx/steroids yesterday. Pt reports sx initially improved, but since completing rx's, sx  have returned/worsened. Pt unsure if she should continue course of PRN INH or if she needs additional rx.  Triager will forward encounter for Dr Isadora 's office to review and advise. Patient verbalized understanding and is expecting call back from office for next steps. Triager also advised that if pt does not hear back from office, to follow disposition with PCP for further evaluation/treatment.  Protocols used: Cough - Acute Productive-A-AH

## 2024-01-05 NOTE — Progress Notes (Signed)
 Synopsis: Referred in by Kim Lenis, MD   Subjective:   PATIENT ID: Kim Bentley GENDER: female DOB: [birth year], MRN: [id number]  Chief Complaint  Patient presents with   Asthma    Cough with white/yellow sputum. Wheezing.     HPI Kim Bentley is a pleasant 83 years old female patient with a past medical history moderate persistent asthma who follows with Dr. Isadora presenting today to the pulmonary clinic for the evaluation of persistent wheezing and chest tightness.   She was seen on 12/29/2023 by Dr. Isadora for an acute asthma exacerbation and was prescribed a 5 days course of prednisone  and azithromycin .   She did improve but still feels that she is not back to baseline.   Her maintenance therapy is symbicort  and Tezepelumab  which she is compliant with.     Family History  Problem Relation Age of Onset   Diabetes Mother    Obesity Mother    Breast cancer Sister 58   Healthy Brother    Brain cancer Brother      Social History   Socioeconomic History   Marital status: Widowed    Spouse name: Not on file   Number of children: Not on file   Years of education: Not on file   Highest education level: Not on file  Occupational History   Not on file  Tobacco Use   Smoking status: Never    Passive exposure: Past   Smokeless tobacco: Never  Vaping Use   Vaping status: Never Used  Substance and Sexual Activity   Alcohol use: Never   Drug use: Never   Sexual activity: Not on file  Other Topics Concern   Not on file  Social History Narrative   Lives alone, grandchild lives with her   Social Drivers of Health   Financial Resource Strain: Low Risk  (12/11/2023)   Received from [facility]   Overall Financial Resource Strain (CARDIA)    Difficulty of Paying Living Expenses: Not hard at all  Food Insecurity: No Food Insecurity (12/11/2023)   Received from [facility]   Hunger Vital Sign    Within the past 12 months, you  worried that your food would run out before you got the money to buy more.: Never true    Within the past 12 months, the food you bought just didn't last and you didn't have money to get more.: Never true  Transportation Needs: No Transportation Needs (12/11/2023)   Received from [facility] - Transportation    In the past 12 months, has lack of transportation kept you from medical appointments or from getting medications?: No    Lack of Transportation (Non-Medical): No  Physical Activity: Not on file  Stress: Not on file  Social Connections: Not on file  Intimate Partner Violence: Not on file        Objective:   Vitals:   01/04/24 1317  BP: 122/68  Pulse: 76  Temp: 97.8 F (36.6 C)  SpO2: 98%  Weight: 177 lb 9.6 oz (80.6 kg)  Height: 5' 1 (1.549 m)   98% on RA BMI Readings from Last 3 Encounters:  01/04/24 33.56 kg/m  12/29/23 32.69 kg/m  10/12/23 32.73 kg/m   Wt Readings from Last 3 Encounters:  01/04/24 177 lb 9.6 oz (80.6 kg)  12/29/23 173 lb (78.5 kg)  10/12/23 173 lb 3.2 oz (78.6 kg)    Physical Exam GEN: NAD, Healthy Appearing HEENT:  Supple Neck, Reactive Pupils, EOMI  CVS: Normal S1, Normal S2, RRR, No murmurs or ES appreciated  Lungs: Diffuse expiratory wheezing appreciated bilaterally.  Abdomen: Soft, non tender, non distended, + BS  Extremities: Warm and well perfused, No edema    Ancillary Information   CBC    Component Value Date/Time   WBC 9.8 05/06/2022 0601   RBC 4.12 05/06/2022 0601   HGB 12.8 05/06/2022 0601   HGB 12.4 10/27/2011 0217   HCT 39.0 05/06/2022 0601   HCT 36.8 10/27/2011 0217   PLT 222 05/06/2022 0601   PLT 182 10/27/2011 0217   MCV 94.7 05/06/2022 0601   MCV 91 10/27/2011 0217   MCH 31.1 05/06/2022 0601   MCHC 32.8 05/06/2022 0601   RDW 13.9 05/06/2022 0601   RDW 12.9 10/27/2011 0217   LYMPHSABS 1.9 01/12/2022 1235   LYMPHSABS 2.3 10/27/2011 0217   MONOABS 0.5 01/12/2022 1235   MONOABS  0.7 10/27/2011 0217   EOSABS 0.2 01/12/2022 1235   EOSABS 0.2 10/27/2011 0217   BASOSABS 0.1 01/12/2022 1235   BASOSABS 0.0 10/27/2011 0217   Labs and imaging were reviewed.      No data to display           Assessment & Plan:  Kim Bentley is a pleasant 83 years old female patient with a past medical history moderate persistent asthma who follows with Dr. Isadora presenting today to the pulmonary clinic for the evaluation of persistent wheezing and chest tightness.   #Severe persistent asthma with acute exacerbation.  Managed last week with a 5 days course of prednisone  and azithromycin . She will likely benefit from a prolonged steroid taper. I will provide her with an IM Methylpred 80mg  here in office and an 8 days steroid taper.   She will also continue on her maintenance therapy with symbicort  and Tezspire . I will also have her follow up with Dr. Isadora in 4 weeks.   Return in about 4 weeks (around 02/01/2024) for With Dr. Isadora.  I personally spent a total of 30 minutes in the care of the patient today including preparing to see the patient, getting/reviewing separately obtained history, performing a medically appropriate exam/evaluation, counseling and educating, placing orders, documenting clinical information in the EHR, and independently interpreting results.   Darrin Barn, MD [place] [facility] 01/05/2024 1:03 PM

## 2024-01-08 ENCOUNTER — Other Ambulatory Visit: Payer: Self-pay

## 2024-01-10 ENCOUNTER — Other Ambulatory Visit: Payer: Self-pay

## 2024-01-17 ENCOUNTER — Encounter (INDEPENDENT_AMBULATORY_CARE_PROVIDER_SITE_OTHER): Payer: Self-pay

## 2024-01-17 ENCOUNTER — Other Ambulatory Visit: Payer: Self-pay

## 2024-01-17 NOTE — Progress Notes (Signed)
 Specialty Pharmacy Refill Coordination Note  Kim Bentley is a 83 y.o. female contacted today regarding refills of specialty medication(s) Tezepelumab -ekko (Tezspire )   Patient requested (Patient-Rptd) Delivery   Delivery date: 01/19/24   Verified address: (Patient-Rptd) [address] [place]   [address]   Medication will be filled on 01/18/24.

## 2024-01-18 ENCOUNTER — Other Ambulatory Visit: Payer: Self-pay

## 2024-01-18 ENCOUNTER — Ambulatory Visit: Admitting: Student in an Organized Health Care Education/Training Program

## 2024-02-07 ENCOUNTER — Ambulatory Visit: Admitting: Internal Medicine

## 2024-02-08 ENCOUNTER — Other Ambulatory Visit (HOSPITAL_COMMUNITY): Payer: Self-pay

## 2024-02-13 ENCOUNTER — Other Ambulatory Visit (HOSPITAL_COMMUNITY): Payer: Self-pay

## 2024-02-15 ENCOUNTER — Other Ambulatory Visit (HOSPITAL_COMMUNITY): Payer: Self-pay

## 2024-02-15 ENCOUNTER — Encounter: Payer: Self-pay | Admitting: Student in an Organized Health Care Education/Training Program

## 2024-02-15 ENCOUNTER — Ambulatory Visit: Admitting: Student in an Organized Health Care Education/Training Program

## 2024-02-15 ENCOUNTER — Other Ambulatory Visit: Payer: Self-pay

## 2024-02-15 VITALS — BP 130/82 | HR 80 | Temp 97.5°F | Ht 61.0 in | Wt 177.0 lb

## 2024-02-15 DIAGNOSIS — J4540 Moderate persistent asthma, uncomplicated: Secondary | ICD-10-CM | POA: Diagnosis not present

## 2024-02-15 DIAGNOSIS — R053 Chronic cough: Secondary | ICD-10-CM

## 2024-02-15 DIAGNOSIS — R918 Other nonspecific abnormal finding of lung field: Secondary | ICD-10-CM | POA: Diagnosis not present

## 2024-02-15 DIAGNOSIS — Z23 Encounter for immunization: Secondary | ICD-10-CM | POA: Diagnosis not present

## 2024-02-15 MED ORDER — LEVOCETIRIZINE DIHYDROCHLORIDE 5 MG PO TABS: 5.0000 mg | ORAL_TABLET | Freq: Every evening | ORAL | 11 refills | Status: AC

## 2024-02-15 MED ORDER — BUDESONIDE-FORMOTEROL FUMARATE 160-4.5 MCG/ACT IN AERO: 2.0000 | INHALATION_SPRAY | Freq: Two times a day (BID) | RESPIRATORY_TRACT | 12 refills | Status: AC

## 2024-02-15 NOTE — Progress Notes (Signed)
 Assessment & Plan:   1. Moderate persistent asthma without complication 2. Chronic cough  She has a history of asthma with multiple previous exacerbations. Most recent exacerbation was in September of 2025 due to a viral infection.   PFT's from 11/2021 showed scooping of the expiratory flow volume loop with a preserved ratio but impaired FEV1. She has a strong family history of asthma, and I suspect asthma or asthma/COPD overlap as her underlying etiology. CT high resolution on 11/23/2021 did not show any signs of ILD. 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  She is maintained on Symbicort  (ICS/LABA) as well as biologics with tezepelumab  with good response and improvement in symptoms. Most recent exacerbation was due to viral illness and was treated with steroids.   - budesonide -formoterol  (SYMBICORT ) 160-4.5 MCG/ACT inhaler; Inhale 2 puffs into the lungs 2 (two) times daily.  Dispense: 10.2 g; Refill: 12 - levocetirizine (XYZAL ) 5 MG tablet; Take 1 tablet (5 mg total) by mouth every evening.  Dispense: 30 tablet; Refill: 11 - Continue Tezepelumab   3. Right lower lobe lung mass (Primary)  Presenting with both right upper lobe and right lower lobe masses with associated lymphadenopathy and is status post 2 robotic assisted navigational bronchoscopies to the right lower lobe and right upper lobes with cytology returning benign. EBUS was similarly benign. These findings most likely represent benign mass/nodule in the setting of immunosuppression as well as rheumatoid arthritis. She has stopped her methotrexate and these masses have been unchanged on 1 year interval repeat CT. Repeat CT 12/18/2023 noted with interval resolution of 9 mm LUL nodule, and stable bilateral lung masses. Will repeat the CT at the one year mark (11/2024). Will reorder at subsequent follow up.  -Repeat CT at one year mark   Return in about 3 months (around 05/17/2024).  Kim November,  MD [facility]  I spent 30 minutes caring for this patient today, including preparing to see the patient, obtaining a medical history , reviewing a separately obtained history, performing a medically appropriate examination and/or evaluation, counseling and educating the patient/family/caregiver, ordering medications, tests, or procedures, documenting clinical information in the electronic health record, and independently interpreting results (not separately reported/billed) and communicating results to the patient/family/caregiver  End of visit medications:  Meds ordered this encounter  Medications   budesonide -formoterol  (SYMBICORT ) 160-4.5 MCG/ACT inhaler    Sig: Inhale 2 puffs into the lungs 2 (two) times daily.    Dispense:  10.2 g    Refill:  12    Please mail to patient   levocetirizine (XYZAL ) 5 MG tablet    Sig: Take 1 tablet (5 mg total) by mouth every evening.    Dispense:  30 tablet    Refill:  11     Current Outpatient Medications:    albuterol  (PROVENTIL ) (2.5 MG/3ML) 0.083% nebulizer solution, Take 2.5 mg by nebulization every 6 (six) hours as needed for wheezing or shortness of breath., Disp: , Rfl:    Ascorbic Acid (VITAMIN C PO), Take 2,000 mg by mouth in the morning., Disp: , Rfl:    aspirin 81 MG EC tablet, Take 81 mg by mouth in the morning., Disp: , Rfl:    Calcium Carb-Cholecalciferol (CALTRATE 600+D3 PO), Take 1 tablet by mouth in the morning., Disp: , Rfl:    Cholecalciferol (VITAMIN D-3) 125 MCG (5000 UT) TABS, Take 5,000 Units by mouth in the morning., Disp: , Rfl:    Cyanocobalamin (VITAMIN B-12) 2500 MCG  SUBL, Take 2,500 mcg by mouth in the morning., Disp: , Rfl:    fluticasone  (FLONASE ) 50 MCG/ACT nasal spray, Place 1 spray into both nostrils daily., Disp: 18.2 mL, Rfl: 6   hydroxychloroquine (PLAQUENIL) 200 MG tablet, Take 200 mg by mouth 2 (two) times daily., Disp: , Rfl:    meloxicam (MOBIC) 7.5 MG tablet, Take 7.5 mg by mouth as  needed., Disp: , Rfl:    naproxen sodium (ALEVE) 220 MG tablet, Take 440 mg by mouth daily as needed (pain.)., Disp: , Rfl:    Omega-3 Fatty Acids (FISH OIL PO), Take 1 capsule by mouth in the morning., Disp: , Rfl:    Potassium 99 MG TABS, Take 99 mg by mouth in the morning., Disp: , Rfl:    Tezepelumab -ekko (TEZSPIRE ) 210 MG/1. SOAJ, Inject 210 mg into the skin every 28 (twenty-eight) days., Disp: 1.91 mL, Rfl: 4   valACYclovir (VALTREX) 1000 MG tablet, Take 1,000-2,000 mg by mouth 2 (two) times daily as needed (cold sore/fever blister)., Disp: , Rfl:    zinc sulfate 220 (50 Zn) MG capsule, Take 220 mg by mouth in the morning., Disp: , Rfl:    albuterol  (VENTOLIN  HFA) 108 (90 Base) MCG/ACT inhaler, Inhale 2 puffs into the lungs every 6 (six) hours as needed for wheezing or shortness of breath. (Patient not taking: Reported on 02/15/2024), Disp: 8 g, Rfl: 0   azithromycin  (ZITHROMAX  Z-PAK) 250 MG tablet, Take 2 tablets on Day 1 and then 1 tablet daily till gone. (Patient not taking: Reported on 02/15/2024), Disp: 6 each, Rfl: 0   budesonide -formoterol  (SYMBICORT ) 160-4.5 MCG/ACT inhaler, Inhale 2 puffs into the lungs 2 (two) times daily., Disp: 10.2 g, Rfl: 12   levocetirizine (XYZAL ) 5 MG tablet, Take 1 tablet (5 mg total) by mouth every evening., Disp: 30 tablet, Rfl: 11   Subjective:   PATIENT ID: Kim Bentley GENDER: female DOB: 10-09-1940, MRN: [id number]  Chief Complaint  Patient presents with   Asthma    SOB. Cough with white sputum. No wheezing. Symptoms for the last week. Albuterol  in Neb PRN. Symbicort  BID helps with her breathing. Tezspire . Xyzal  at night.     HPI  Kim Bentley is a pleasant 83 year old female presenting for follow up of asthma.  Return Visit 10/05/2023:   We initiated Tezepelumab  which she has tolerated well, and feels that her symptoms are much better with it. She did stop her Trelegy and switched to Symbicort  secondary to ocular symptoms. She is currently  reporting minimal cough, and has no wheeze. She's reporting some exertional dyspnea. She otherwise has no complaints. She has not had any exacerbations since our last visit. We are also following her pulmonary nodules with serial CT's that show them to be stable. She does report some weight gain.   Return Visit 12/29/2023:   She presents for an acute visit with symptoms of cough, shortness of breath, and wheeze for a week. Multiple sick contacts reported (daughter, grandchild). She's done well outside of this, and is tolerating tezepelumab  well. She feels much better and is currently only maintained on Symbicort , with good response. COVID test is negative.   She underwent robotic assisted navigational bronchoscopy on 01/21/2022 to the RLL mass, in addition EBUS to multiple stations. The results from the biopsy has returned benign, with cultures growing Ralstonia Picketti. Similarly, result from the EBUS has been negative for malignancy. Repeat Navigational Bronchoscopy on 05/06/2022 showed benign cytology and cultures with H. Influenza. EBUS was not repeated  during said procedure.  Acute Visit 01/04/2024:  Seen by Dr. Malka for asthma, prednisone  course extended.  Return Visit 02/15/2024:  Returns for follow up today and feels much better than prior. Shortness of breath has improved, and she has no wheeze. Compliant with Symbicort  as well as Tezepelumab . She continues to use Duo-nebs as needed. She does have some cough that is minimal. Symptoms much improved compared to prior.   In regards to her RA, she has followed with rheumatology. Patient is off methotrexate and continues on hydrxychloroquine. She is a non-smoker.  Ancillary information including prior medications, full medical/surgical/family/social histories, and PFTs (when available) are listed below and have been reviewed.    Review of Systems  Constitutional:  Negative for chills, fever, malaise/fatigue and weight loss.  Respiratory:   Positive for cough and shortness of breath. Negative for hemoptysis, sputum production and wheezing.   Cardiovascular:  Negative for chest pain.     Objective:   Vitals:   02/15/24 1030  BP: 130/82  Pulse: 80  Temp: (!) 97.5 F (36.4 C)  SpO2: 97%  Weight: 177 lb (80.3 kg)  Height: 5' 1 (1.549 m)   97% on RA  BMI Readings from Last 3 Encounters:  02/15/24 33.44 kg/m  01/04/24 33.56 kg/m  12/29/23 32.69 kg/m   Wt Readings from Last 3 Encounters:  02/15/24 177 lb (80.3 kg)  01/04/24 177 lb 9.6 oz (80.6 kg)  12/29/23 173 lb (78.5 kg)    Physical Exam Constitutional:      Appearance: Normal appearance. She is obese.  Cardiovascular:     Rate and Rhythm: Normal rate and regular rhythm.     Pulses: Normal pulses.     Heart sounds: Normal heart sounds.  Pulmonary:     Effort: Pulmonary effort is normal. No respiratory distress.     Breath sounds: Normal breath sounds. No wheezing, rhonchi or rales.  Neurological:     General: No focal deficit present.     Mental Status: She is alert and oriented to person, place, and time. Mental status is at baseline.       Ancillary Information    Past Medical History:  Diagnosis Date   Allergic genetic state    Arthritis    Bladder spasms    COPD (chronic obstructive pulmonary disease) (HCC)    mild   COVID 2021   very mild   Dyspnea    GERD (gastroesophageal reflux disease)    History of kidney stones    HLD (hyperlipidemia)    Pneumonia    Pulmonary mass      Family History  Problem Relation Age of Onset   Diabetes Mother    Obesity Mother    Breast cancer Sister 71   Healthy Brother    Brain cancer Brother      Past Surgical History:  Procedure Laterality Date   BRONCHIAL BIOPSY  05/06/2022   Procedure: BRONCHIAL BIOPSIES;  Surgeon: Isadora Hose, MD;  Location: [facility] ENDOSCOPY;  Service: Pulmonary;;   BRONCHIAL NEEDLE ASPIRATION BIOPSY  05/06/2022   Procedure: BRONCHIAL NEEDLE ASPIRATION BIOPSIES;   Surgeon: Isadora Hose, MD;  Location: [facility];  Service: Pulmonary;;   COLONOSCOPY     COLONOSCOPY N/A 09/27/2021   Procedure: COLONOSCOPY;  Surgeon: Maryruth Ole DASEN, MD;  Location: [facility] ENDOSCOPY;  Service: Endoscopy;  Laterality: N/A;   COLONOSCOPY WITH PROPOFOL  N/A 03/26/2018   Procedure: COLONOSCOPY WITH PROPOFOL ;  Surgeon: Gaylyn Gladis PENNER, MD;  Location: [facility] ENDOSCOPY;  Service: Endoscopy;  Laterality:  N/A;   ESOPHAGOGASTRODUODENOSCOPY     HERNIA REPAIR     TONSILLECTOMY     VIDEO BRONCHOSCOPY WITH RADIAL ENDOBRONCHIAL ULTRASOUND  05/06/2022   Procedure: VIDEO BRONCHOSCOPY WITH RADIAL ENDOBRONCHIAL ULTRASOUND;  Surgeon: Isadora Hose, MD;  Location: [facility]OS[facility];  Service: Pulmonary;;    Social History   Socioeconomic History   Marital status: Widowed    Spouse name: Not on file   Number of children: Not on file   Years of education: Not on file   Highest education level: Not on file  Occupational History   Not on file  Tobacco Use   Smoking status: Never    Passive exposure: Past   Smokeless tobacco: Never  Vaping Use   Vaping status: Never Used  Substance and Sexual Activity   Alcohol use: Never   Drug use: Never   Sexual activity: Not on file  Other Topics Concern   Not on file  Social History Narrative   Lives alone, grandchild lives with her   Social Drivers of Health   Financial Resource Strain: Low Risk  (12/11/2023)   Received from [facility]   Overall Financial Resource Strain (CARDIA)    Difficulty of Paying Living Expenses: Not hard at all  Food Insecurity: No Food Insecurity (12/11/2023)   Received from [facility]   Hunger Vital Sign    Within the past 12 months, you worried that your food would run out before you got the money to buy more.: Never true    Within the past 12 months, the food you bought just didn't last and you didn't have money to get more.: Never true  Transportation Needs: No  Transportation Needs (12/11/2023)   Received from [facility] Transportation    In the past 12 months, has lack of transportation kept you from medical appointments or from getting medications?: No    Lack of Transportation (Non-Medical): No  Physical Activity: Not on file  Stress: Not on file  Social Connections: Not on file  Intimate Partner Violence: Not on file     No Known Allergies   CBC    Component Value Date/Time   WBC 9.8 05/06/2022 0601   RBC 4.12 05/06/2022 0601   HGB 12.8 05/06/2022 0601   HGB 12.4 10/27/2011 0217   HCT 39.0 05/06/2022 0601   HCT 36.8 10/27/2011 0217   PLT 222 05/06/2022 0601   PLT 182 10/27/2011 0217   MCV 94.7 05/06/2022 0601   MCV 91 10/27/2011 0217   MCH 31.1 05/06/2022 0601   MCHC 32.8 05/06/2022 0601   RDW 13.9 05/06/2022 0601   RDW 12.9 10/27/2011 0217   LYMPHSABS 1.9 01/12/2022 1235   LYMPHSABS 2.3 10/27/2011 0217   MONOABS 0.5 01/12/2022 1235   MONOABS 0.7 10/27/2011 0217   EOSABS 0.2 01/12/2022 1235   EOSABS 0.2 10/27/2011 0217   BASOSABS 0.1 01/12/2022 1235   BASOSABS 0.0 10/27/2011 0217    Pulmonary Functions Testing Results:     No data to display          Outpatient Medications Prior to Visit  Medication Sig Dispense Refill   albuterol  (PROVENTIL ) (2.5 MG/3ML) 0.083% nebulizer solution Take 2.5 mg by nebulization every 6 (six) hours as needed for wheezing or shortness of breath.     Ascorbic Acid (VITAMIN C PO) Take 2,000 mg by mouth in the morning.     aspirin 81 MG EC tablet Take 81 mg by mouth  in the morning.     Calcium Carb-Cholecalciferol (CALTRATE 600+D3 PO) Take 1 tablet by mouth in the morning.     Cholecalciferol (VITAMIN D-3) 125 MCG (5000 UT) TABS Take 5,000 Units by mouth in the morning.     Cyanocobalamin (VITAMIN B-12) 2500 MCG SUBL Take 2,500 mcg by mouth in the morning.     fluticasone  (FLONASE ) 50 MCG/ACT nasal spray Place 1 spray into both nostrils daily. 18.2 mL 6    hydroxychloroquine (PLAQUENIL) 200 MG tablet Take 200 mg by mouth 2 (two) times daily.     meloxicam (MOBIC) 7.5 MG tablet Take 7.5 mg by mouth as needed.     naproxen sodium (ALEVE) 220 MG tablet Take 440 mg by mouth daily as needed (pain.).     Omega-3 Fatty Acids (FISH OIL PO) Take 1 capsule by mouth in the morning.     Potassium 99 MG TABS Take 99 mg by mouth in the morning.     Tezepelumab -ekko (TEZSPIRE ) 210 MG/1. SOAJ Inject 210 mg into the skin every 28 (twenty-eight) days. 1.91 mL 4   valACYclovir (VALTREX) 1000 MG tablet Take 1,000-2,000 mg by mouth 2 (two) times daily as needed (cold sore/fever blister).     zinc sulfate 220 (50 Zn) MG capsule Take 220 mg by mouth in the morning.     budesonide -formoterol  (SYMBICORT ) 160-4.5 MCG/ACT inhaler Inhale 2 puffs into the lungs 2 (two) times daily. 10.2 g 12   levocetirizine (XYZAL ) 5 MG tablet Take 1 tablet (5 mg total) by mouth every evening. 30 tablet 11   albuterol  (VENTOLIN  HFA) 108 (90 Base) MCG/ACT inhaler Inhale 2 puffs into the lungs every 6 (six) hours as needed for wheezing or shortness of breath. (Patient not taking: Reported on 02/15/2024) 8 g 0   azithromycin  (ZITHROMAX  Z-PAK) 250 MG tablet Take 2 tablets on Day 1 and then 1 tablet daily till gone. (Patient not taking: Reported on 02/15/2024) 6 each 0   No facility-administered medications prior to visit.

## 2024-02-15 NOTE — Addendum Note (Signed)
 Addended by: Laelia Angelo M on: 02/15/2024 10:53 AM   Modules accepted: Orders

## 2024-02-19 ENCOUNTER — Other Ambulatory Visit: Payer: Self-pay

## 2024-02-19 NOTE — Progress Notes (Signed)
 Clinical Intervention Note  Clinical Intervention Notes: Patient reported starting supplement called Healthy Flow Blood Support. Per [facility], no DDIs identified with Tezspire    Clinical Intervention Outcomes: Prevention of an adverse drug event   Advertising Account Planner

## 2024-02-19 NOTE — Progress Notes (Signed)
 Specialty Pharmacy Refill Coordination Note  Kim Bentley is a 83 y.o. female contacted today regarding refills of specialty medication(s) Tezepelumab -ekko (Tezspire )   Patient requested Delivery   Delivery date: 02/21/24   Verified address: [address]   [place] [address]   Medication will be filled on: 02/20/24

## 2024-03-06 ENCOUNTER — Telehealth: Payer: Self-pay | Admitting: *Deleted

## 2024-03-06 NOTE — Telephone Encounter (Signed)
 Copied from CRM [id number]. Topic: Medical Record Request - Provider/Facility Request >> Mar 04, 2024  2:31 PM Chantha C wrote: Reason for CRM: Mylinda from Tezspire  [phone] if patient has the re-enrollement assistance forms for Tezspire ? Please and call back

## 2024-03-06 NOTE — Telephone Encounter (Addendum)
 Forms received.

## 2024-03-06 NOTE — Telephone Encounter (Signed)
 Reached out to Tezspire  and was transferred to [employer] to request patient assistance renewal forms. Rep stated he will fax them to our Onbase.  [employer] phone #: [phone]

## 2024-03-12 ENCOUNTER — Other Ambulatory Visit: Payer: Self-pay | Admitting: Student in an Organized Health Care Education/Training Program

## 2024-03-12 ENCOUNTER — Other Ambulatory Visit: Payer: Self-pay

## 2024-03-12 ENCOUNTER — Other Ambulatory Visit (HOSPITAL_COMMUNITY): Payer: Self-pay

## 2024-03-12 DIAGNOSIS — J4540 Moderate persistent asthma, uncomplicated: Secondary | ICD-10-CM

## 2024-03-12 MED ORDER — TEZSPIRE 210 MG/1.91ML ~~LOC~~ SOAJ
210.0000 mg | SUBCUTANEOUS | 4 refills | Status: AC
  Filled 2024-03-12 – 2024-03-22 (×3): qty 1.91, 28d supply, fill #0
  Filled 2024-04-15: qty 1.91, 28d supply, fill #1
  Filled 2024-05-13: qty 1.91, 28d supply, fill #2

## 2024-03-12 NOTE — Telephone Encounter (Signed)
 Refill sent for TEZSPIRE  to [facility]: [phone]   Dose: 210mg   every 28 days  Last OV: 02/15/24 Provider: Dr. Isadora  Next OV: 06/07/23  Aleck Puls, PharmD, BCPS Clinical Pharmacist  [facility]

## 2024-03-14 ENCOUNTER — Other Ambulatory Visit: Payer: Self-pay

## 2024-03-17 ENCOUNTER — Telehealth: Payer: Self-pay

## 2024-03-17 NOTE — Telephone Encounter (Signed)
 Patient enrolled into asthma grant through [employer] Amount: $2000 Award Period: 09/19/2023 - 03/17/2025 ID: [id number] BIN: [id number] PCN: [id number] Group: [id number] For pharmacy inquiries, contact PDMI at [phone]. For patient inquiries, contact [employer] at [phone].  Patient filling Tezspire  through [facility] already. In 2026, we can utilize grant for patient to fill Tezspire  through [facility] MyChart message sent to pt  Sherry Pennant, PharmD, MPH, BCPS, CPP Clinical Pharmacist [facility])

## 2024-03-18 ENCOUNTER — Other Ambulatory Visit: Payer: Self-pay

## 2024-03-18 ENCOUNTER — Other Ambulatory Visit (HOSPITAL_COMMUNITY): Payer: Self-pay

## 2024-03-22 ENCOUNTER — Other Ambulatory Visit: Payer: Self-pay

## 2024-03-22 ENCOUNTER — Other Ambulatory Visit: Payer: Self-pay | Admitting: Pharmacy Technician

## 2024-03-22 NOTE — Progress Notes (Signed)
 Specialty Pharmacy Refill Coordination Note  KACI DILLIE is a 83 y.o. female contacted today regarding refills of specialty medication(s) Tezepelumab -ekko (Tezspire )   Patient requested Delivery   Delivery date: 03/26/24   Verified address: [address]  [place]   Medication will be filled on: 03/25/24

## 2024-03-25 ENCOUNTER — Other Ambulatory Visit: Payer: Self-pay

## 2024-04-15 ENCOUNTER — Other Ambulatory Visit (HOSPITAL_COMMUNITY): Payer: Self-pay

## 2024-04-17 ENCOUNTER — Other Ambulatory Visit: Payer: Self-pay

## 2024-04-17 NOTE — Progress Notes (Signed)
 Specialty Pharmacy Refill Coordination Note  Kim Bentley is a 83 y.o. female contacted today regarding refills of specialty medication(s) Tezepelumab -ekko (Tezspire )   Patient requested Delivery   Delivery date: 04/23/24   Verified address: [address]  [place]   Medication will be filled on: 04/22/24

## 2024-04-22 ENCOUNTER — Other Ambulatory Visit: Payer: Self-pay

## 2024-05-13 ENCOUNTER — Other Ambulatory Visit: Payer: Self-pay

## 2024-05-14 ENCOUNTER — Other Ambulatory Visit: Payer: Self-pay

## 2024-05-14 NOTE — Progress Notes (Signed)
 Specialty Pharmacy Refill Coordination Note  Kim Bentley is a 84 y.o. female contacted today regarding refills of specialty medication(s) Tezepelumab -ekko (Tezspire )   Patient requested (Patient-Rptd) Delivery   Delivery date: 05/16/24   Verified address: (Patient-Rptd) [address] [place]   Medication will be filled on: 05/15/24

## 2024-05-15 ENCOUNTER — Other Ambulatory Visit: Payer: Self-pay

## 2024-05-17 ENCOUNTER — Ambulatory Visit: Admitting: Pulmonary Disease

## 2024-05-17 ENCOUNTER — Ambulatory Visit: Payer: Self-pay | Admitting: Student in an Organized Health Care Education/Training Program

## 2024-05-17 VITALS — BP 118/78 | HR 85 | Temp 98.2°F | Ht 61.0 in | Wt 177.0 lb

## 2024-05-17 DIAGNOSIS — J4551 Severe persistent asthma with (acute) exacerbation: Secondary | ICD-10-CM | POA: Diagnosis not present

## 2024-05-17 DIAGNOSIS — J069 Acute upper respiratory infection, unspecified: Secondary | ICD-10-CM | POA: Diagnosis not present

## 2024-05-17 DIAGNOSIS — J45909 Unspecified asthma, uncomplicated: Secondary | ICD-10-CM

## 2024-05-17 MED ORDER — PREDNISONE 20 MG PO TABS: 40.0000 mg | ORAL_TABLET | Freq: Every day | ORAL | 0 refills | Status: AC

## 2024-05-17 MED ORDER — AZITHROMYCIN 250 MG PO TABS: ORAL_TABLET | ORAL | 0 refills | Status: AC

## 2024-05-17 NOTE — Telephone Encounter (Signed)
 FYI Only or Action Required?: FYI only for provider: appointment scheduled on 1/23.  Patient is followed in Pulmonology for ILD, last seen on 02/15/2024 by Isadora Hose, MD.  Called Nurse Triage reporting Shortness of Breath.  Symptoms began a week ago.  Interventions attempted: OTC medications: tylenol , OTC allergy med, Rescue inhaler, and Nebulizer treatments.  Symptoms are: gradually worsening.  Triage Disposition: See HCP Within 4 Hours (Or PCP Triage)  Patient/caregiver understands and will follow disposition?: Yes       Reason for Disposition  [1] Longstanding difficulty breathing (e.g., CHF, COPD, emphysema) AND [2] WORSE than normal  Answer Assessment - Initial Assessment Questions This RN recommended pt be examined in next 4 hours, scheduled for today am with pulm. Advised call back or seek immediate care if new or worsening symptoms.      1. RESPIRATORY STATUS: Describe your breathing? (e.g., wheezing, shortness of breath, unable to speak, severe coughing)      Little bit more SOB today Albuterol  nebulizer this morning, made me feel better Rescue inhaler x1 in middle of night  2. ONSET: When did this breathing problem begin?      Started last week, runny and itchy nose but just went into chest last night  3. PATTERN Does the difficult breathing come and go, or has it been constant since it started?      All the time, more SOB than usual  7. LUNG HISTORY: Do you have any history of lung disease?  (e.g., pulmonary embolus, asthma, emphysema)     ILD  9. OTHER SYMPTOMS: Do you have any other symptoms? (e.g., chest pain, cough, dizziness, fever, runny nose)     Chest tightness when cough Interrupted sleep Started wheezing, know it's coming on now Yellow sputum with cough Day before yesterday chills, thought maybe fever but been taking tylenol  and think that's helped that, mild  Denies: Struggling to breathe Disorientation Feeling  faint Stridor Chest pain otherwise Hx blood clot in legs or lungs  Protocols used: Breathing Difficulty-A-AH

## 2024-05-17 NOTE — Telephone Encounter (Signed)
 Noted. Nothing further needed.

## 2024-05-20 NOTE — Progress Notes (Signed)
 "  Synopsis: Referred in by Kim Lenis, MD   Subjective:   PATIENT ID: Kim Bentley GENDER: female DOB: [birth year], MRN: [id number]  Chief Complaint  Patient presents with   Acute Visit    Last week she started with itchy eyes and itchy nose. Chills and sweats. Wheezing. Cough with yellow sputum.  Tezspire . Symbicort - TID. Albuterol  NEB- PRN.     HPI Kim Bentley is a pleasant 84 years old female patient with a past medical history moderate persistent asthma who follows with Kim Bentley presenting today to the pulmonary clinic for the evaluation of persistent wheezing and chest tightness.   She reports a week of cough and chest tightness as well as runny nose and sore throat. She appears to be in an asthma exacerbation secondary to a URI.   Outside of these exacerbations which have become less frequest than prior, she reports that Tezspire  has significantly helped her.    Her maintenance therapy is symbicort  and Tezepelumab  which she is compliant with.     Family History  Problem Relation Age of Onset   Diabetes Mother    Obesity Mother    Breast cancer Sister 71   Healthy Brother    Brain cancer Brother      Social History   Socioeconomic History   Marital status: Widowed    Spouse name: Not on file   Number of children: Not on file   Years of education: Not on file   Highest education level: Not on file  Occupational History   Not on file  Tobacco Use   Smoking status: Never    Passive exposure: Past   Smokeless tobacco: Never  Vaping Use   Vaping status: Never Used  Substance and Sexual Activity   Alcohol use: Never   Drug use: Never   Sexual activity: Not on file  Other Topics Concern   Not on file  Social History Narrative   Lives alone, grandchild lives with her   Social Drivers of Health   Tobacco Use: Low Risk (02/15/2024)   Patient History    Smoking Tobacco Use: Never    Smokeless Tobacco Use: Never    Passive Exposure: Past  Financial  Resource Strain: Low Risk  (12/11/2023)   Received from [facility]   Overall Financial Resource Strain (CARDIA)    Difficulty of Paying Living Expenses: Not hard at all  Food Insecurity: No Food Insecurity (12/11/2023)   Received from [facility]   Epic    Within the past 12 months, you worried that your food would run out before you got the money to buy more.: Never true    Within the past 12 months, the food you bought just didn't last and you didn't have money to get more.: Never true  Transportation Needs: No Transportation Needs (12/11/2023)   [facility] - Transportation    In the past 12 months, has lack of transportation kept you from medical appointments or from getting medications?: No    Lack of Transportation (Non-Medical): No  Physical Activity: Not on file  Stress: Not on file  Social Connections: Not on file  Intimate Partner Violence: Not on file  Depression (EYV7-0): Not on file  Alcohol Screen: Not on file  Housing: Unknown (12/11/2023)   Received from [facility]   Epic    In the last 12 months, was there a time when you were not able to  pay the mortgage or rent on time?: No    Number of Times Moved in the Last Year: Not on file    At any time in the past 12 months, were you homeless or living in a shelter (including now)?: No  Utilities: Not At Risk (12/11/2023)   Received from [facility]   Epic    In the past 12 months has the electric, gas, oil, or water company threatened to shut off services in your home?: No  Health Literacy: Not on file        Objective:   Vitals:   05/17/24 1108  BP: 118/78  Pulse: 85  Temp: 98.2 F (36.8 C)  SpO2: 98%  Weight: 177 lb (80.3 kg)  Height: 5' 1 (1.549 m)   98% on RA BMI Readings from Last 3 Encounters:  05/17/24 33.44 kg/m  02/15/24 33.44 kg/m  01/04/24 33.56 kg/m   Wt Readings from Last 3  Encounters:  05/17/24 177 lb (80.3 kg)  02/15/24 177 lb (80.3 kg)  01/04/24 177 lb 9.6 oz (80.6 kg)    Physical Exam GEN: NAD, Healthy Appearing HEENT: Supple Neck, Reactive Pupils, EOMI  CVS: Normal S1, Normal S2, RRR, No murmurs or ES appreciated  Lungs: Diffuse expiratory wheezing appreciated bilaterally.  Abdomen: Soft, non tender, non distended, + BS  Extremities: Warm and well perfused, No edema    Ancillary Information   CBC    Component Value Date/Time   WBC 9.8 05/06/2022 0601   RBC 4.12 05/06/2022 0601   HGB 12.8 05/06/2022 0601   HGB 12.4 10/27/2011 0217   HCT 39.0 05/06/2022 0601   HCT 36.8 10/27/2011 0217   PLT 222 05/06/2022 0601   PLT 182 10/27/2011 0217   MCV 94.7 05/06/2022 0601   MCV 91 10/27/2011 0217   MCH 31.1 05/06/2022 0601   MCHC 32.8 05/06/2022 0601   RDW 13.9 05/06/2022 0601   RDW 12.9 10/27/2011 0217   LYMPHSABS 1.9 01/12/2022 1235   LYMPHSABS 2.3 10/27/2011 0217   MONOABS 0.5 01/12/2022 1235   MONOABS 0.7 10/27/2011 0217   EOSABS 0.2 01/12/2022 1235   EOSABS 0.2 10/27/2011 0217   BASOSABS 0.1 01/12/2022 1235   BASOSABS 0.0 10/27/2011 0217   Labs and imaging were reviewed.      No data to display           Assessment & Plan:  Kim Bentley is a pleasant 84 years old female patient with a past medical history moderate persistent asthma who follows with Kim Bentley presenting today to the pulmonary clinic for the evaluation of persistent wheezing and chest tightness.   #Severe persistent asthma with acute exacerbation.  Secondary to a URI.  -- Will provide azithromycin  and prednisone  course. -- She will also continue on her maintenance therapy with symbicort  and Tezspire . Advised on using Symbicort  an extra dose during the next 7 days.  -- Follow up with Kim Bentley in Feb.   No follow-ups on file.  I personally spent a total of 40 minutes in the care of the patient today including preparing to see the patient, getting/reviewing  separately obtained history, performing a medically appropriate exam/evaluation, counseling and educating, placing orders, documenting clinical information in the EHR, independently interpreting results, and communicating results.   Kim Barn, MD [facility] 05/20/2024 2:01 PM     "

## 2024-05-23 NOTE — Addendum Note (Signed)
 Addended by: Zeynep Fantroy L on: 05/23/2024 12:36 PM   Modules accepted: Orders

## 2024-05-29 ENCOUNTER — Other Ambulatory Visit: Payer: Self-pay

## 2024-06-06 ENCOUNTER — Ambulatory Visit: Admitting: Student in an Organized Health Care Education/Training Program
